# Patient Record
Sex: Female | Born: 1945
Health system: Southern US, Community
[De-identification: ages and names within clinical notes are randomized; demographics above are authoritative.]

## PROBLEM LIST (undated history)

## (undated) DIAGNOSIS — J45909 Unspecified asthma, uncomplicated: Secondary | ICD-10-CM

## (undated) DIAGNOSIS — H01003 Unspecified blepharitis right eye, unspecified eyelid: Secondary | ICD-10-CM

## (undated) DIAGNOSIS — K56600 Partial intestinal obstruction, unspecified as to cause: Secondary | ICD-10-CM

## (undated) DIAGNOSIS — K5792 Diverticulitis of intestine, part unspecified, without perforation or abscess without bleeding: Secondary | ICD-10-CM

## (undated) DIAGNOSIS — J449 Chronic obstructive pulmonary disease, unspecified: Secondary | ICD-10-CM

## (undated) DIAGNOSIS — Z8619 Personal history of other infectious and parasitic diseases: Secondary | ICD-10-CM

## (undated) DIAGNOSIS — R918 Other nonspecific abnormal finding of lung field: Secondary | ICD-10-CM

## (undated) DIAGNOSIS — G4733 Obstructive sleep apnea (adult) (pediatric): Secondary | ICD-10-CM

## (undated) DIAGNOSIS — I10 Essential (primary) hypertension: Secondary | ICD-10-CM

## (undated) DIAGNOSIS — H01006 Unspecified blepharitis left eye, unspecified eyelid: Secondary | ICD-10-CM

## (undated) DIAGNOSIS — H269 Unspecified cataract: Secondary | ICD-10-CM

## (undated) DIAGNOSIS — M1990 Unspecified osteoarthritis, unspecified site: Secondary | ICD-10-CM

## (undated) HISTORY — DX: Chronic obstructive pulmonary disease, unspecified: J44.9

## (undated) HISTORY — DX: Unspecified cataract: H26.9

## (undated) HISTORY — DX: Unspecified blepharitis right eye, unspecified eyelid: H01.003

## (undated) HISTORY — DX: Unspecified blepharitis right eye, unspecified eyelid: H01.006

## (undated) HISTORY — DX: Essential (primary) hypertension: I10

## (undated) HISTORY — DX: Personal history of other infectious and parasitic diseases: Z86.19

## (undated) HISTORY — PX: WISDOM TOOTH EXTRACTION: SHX21

## (undated) HISTORY — DX: Obstructive sleep apnea (adult) (pediatric): G47.33

## (undated) HISTORY — DX: Diverticulitis of intestine, part unspecified, without perforation or abscess without bleeding: K57.92

## (undated) HISTORY — PX: DILATION AND CURETTAGE OF UTERUS: SHX78

## (undated) HISTORY — PX: ANKLE SURGERY: SHX546

## (undated) HISTORY — DX: Partial intestinal obstruction, unspecified as to cause: K56.600

## (undated) HISTORY — DX: Other nonspecific abnormal finding of lung field: R91.8

## (undated) HISTORY — PX: OTHER SURGICAL HISTORY: SHX169

---

## 1998-04-01 ENCOUNTER — Emergency Department (HOSPITAL_COMMUNITY): Admission: EM | Admit: 1998-04-01 | Discharge: 1998-04-01 | Payer: Self-pay | Admitting: Emergency Medicine

## 1998-04-01 ENCOUNTER — Encounter: Payer: Self-pay | Admitting: Emergency Medicine

## 1998-07-22 ENCOUNTER — Encounter: Payer: Self-pay | Admitting: Obstetrics and Gynecology

## 1998-07-24 ENCOUNTER — Ambulatory Visit (HOSPITAL_COMMUNITY): Admission: RE | Admit: 1998-07-24 | Discharge: 1998-07-24 | Payer: Self-pay | Admitting: Obstetrics and Gynecology

## 1998-07-24 ENCOUNTER — Encounter (INDEPENDENT_AMBULATORY_CARE_PROVIDER_SITE_OTHER): Payer: Self-pay

## 1999-12-03 ENCOUNTER — Other Ambulatory Visit: Admission: RE | Admit: 1999-12-03 | Discharge: 1999-12-03 | Payer: Self-pay | Admitting: Obstetrics and Gynecology

## 2000-12-18 ENCOUNTER — Other Ambulatory Visit: Admission: RE | Admit: 2000-12-18 | Discharge: 2000-12-18 | Payer: Self-pay | Admitting: Obstetrics and Gynecology

## 2001-07-11 ENCOUNTER — Encounter: Payer: Self-pay | Admitting: Emergency Medicine

## 2001-07-11 ENCOUNTER — Emergency Department (HOSPITAL_COMMUNITY): Admission: EM | Admit: 2001-07-11 | Discharge: 2001-07-11 | Payer: Self-pay | Admitting: Emergency Medicine

## 2003-04-28 ENCOUNTER — Ambulatory Visit (HOSPITAL_COMMUNITY): Admission: RE | Admit: 2003-04-28 | Discharge: 2003-04-28 | Payer: Self-pay | Admitting: Gastroenterology

## 2004-08-11 ENCOUNTER — Encounter: Admission: RE | Admit: 2004-08-11 | Discharge: 2004-08-11 | Payer: Self-pay | Admitting: Rheumatology

## 2005-08-18 ENCOUNTER — Encounter: Admission: RE | Admit: 2005-08-18 | Discharge: 2005-08-18 | Payer: Self-pay | Admitting: Rheumatology

## 2006-11-25 ENCOUNTER — Inpatient Hospital Stay (HOSPITAL_COMMUNITY): Admission: EM | Admit: 2006-11-25 | Discharge: 2006-11-25 | Payer: Self-pay | Admitting: Emergency Medicine

## 2007-05-29 ENCOUNTER — Ambulatory Visit (HOSPITAL_COMMUNITY): Admission: RE | Admit: 2007-05-29 | Discharge: 2007-05-29 | Payer: Self-pay | Admitting: Gastroenterology

## 2009-06-28 ENCOUNTER — Emergency Department (HOSPITAL_COMMUNITY): Admission: EM | Admit: 2009-06-28 | Discharge: 2009-06-28 | Payer: Self-pay | Admitting: Emergency Medicine

## 2009-06-29 ENCOUNTER — Inpatient Hospital Stay (HOSPITAL_COMMUNITY): Admission: EM | Admit: 2009-06-29 | Discharge: 2009-07-01 | Payer: Self-pay | Admitting: Emergency Medicine

## 2009-06-29 ENCOUNTER — Encounter: Payer: Self-pay | Admitting: Internal Medicine

## 2009-07-15 ENCOUNTER — Encounter: Payer: Self-pay | Admitting: Internal Medicine

## 2009-08-13 ENCOUNTER — Ambulatory Visit: Payer: Self-pay | Admitting: Internal Medicine

## 2009-08-13 DIAGNOSIS — R918 Other nonspecific abnormal finding of lung field: Secondary | ICD-10-CM | POA: Insufficient documentation

## 2009-08-18 DIAGNOSIS — I10 Essential (primary) hypertension: Secondary | ICD-10-CM | POA: Insufficient documentation

## 2009-08-20 ENCOUNTER — Ambulatory Visit: Payer: Self-pay | Admitting: Internal Medicine

## 2009-08-24 ENCOUNTER — Telehealth: Payer: Self-pay | Admitting: Internal Medicine

## 2010-01-24 ENCOUNTER — Encounter: Payer: Self-pay | Admitting: Rheumatology

## 2010-02-02 NOTE — Letter (Signed)
Summary: [facility]   Imported By: Sherian Rein 09/08/2009 08:25:58  _____________________________________________________________________  External Attachment:    Type:   Image     Comment:   External Document

## 2010-02-02 NOTE — Progress Notes (Signed)
Summary: pft results  Phone Note Call from Patient Call back at [phone]   Caller: Patient Call For: Robertt Buda Reason for Call: Lab or Test Results Summary of Call: Wants pft results. Initial call taken by: Darletta Moll,  August 24, 2009 10:20 AM  Follow-up for Phone Call        Please advise on PFT results. PFT done on 08/20/2009.Michel Bickers [facility]  August 24, 2009 10:23 AM  Additional Follow-up for Phone Call Additional follow up Details #1::        Where are these? Do we know why not in computer. They should have been read and scanned by now.??? Additional Follow-up by: Waymon Budge MD,  August 25, 2009 8:31 PM    Additional Follow-up for Phone Call Additional follow up Details #2::    Katie have you seen this pt PFTs? Carron Curie CMA  August 26, 2009 1:24 PM   PFTs reprinted and placed on CDY's cart to reivew.Reynaldo Minium CMA  August 26, 2009 3:04 PM    Additional Follow-up for Phone Call Additional follow up Details #3:: Details for Additional Follow-up Action Taken: [facility] to report- mild restriction of volume and mild obstruction of flow. These are not bad scores. i will go over the withher at next ov. Additional Follow-up by: Waymon Budge MD,  August 30, 2009 10:46 PM  LMTCB.Reynaldo Minium CMA  August 31, 2009 11:50 AM    Pt aware of results and next appt. If any questions or concerns before next OV pt to call the office for sooner appt.Reynaldo Minium CMA  August 31, 2009 4:53 PM

## 2010-02-02 NOTE — Miscellaneous (Signed)
Summary: Orders Update pft charges  Clinical Lists Changes  Orders: Added new Service order of Carbon Monoxide diffusing w/capacity (94720) - Signed Added new Service order of Lung Volumes (94240) - Signed Added new Service order of Spirometry (Pre & Post) (94060) - Signed 

## 2010-02-02 NOTE — Assessment & Plan Note (Signed)
Summary: eval nodule/ mbw   Primary Zakaree Mcclenahan/Referring Kellin Fifer:  Danise Edge  CC:  Pulmonary Consult-Evaluate Nodule; Dr. Danise Edge.Marland Kitchen  History of Present Illness: August 13, 2009- 65 yoF referred courtesy of Dr Laural Benes because of lung nodules. She had a CT for partial small bowel obstruction with diverticulitis in June. Two 5mm noncalcified nodules were seen incidentally in the left lower lobe. She had several years of persistent bronchitis about 9 years ago. Last CXR was about that time. No hx pneumonia or TB exposure. and not aware of exposure to granulomatous infection endemic areas. Denies cough, nodes, blood, pain, fever, sweats or weight loss.   Preventive Screening-Counseling & Management  Alcohol-Tobacco     Smoking Status: never     Passive Smoke Exposure: yes     Passive Smoke Counseling: to avoid passive smoke exposure  Comments: Paents smoked as she grew up, and first husband smoked  Current Medications (verified): 1)  Hydrochlorothiazide 25 Mg Tabs (Hydrochlorothiazide) .... Take 1 By Mouth Once Daily 2)  Celebrex 200 Mg Caps (Celecoxib) .... Take 1 By Mouth Once Daily As Needed  Allergies (verified): No Known Drug Allergies  Past History:  Past Medical History: Hypertension Diverticulitis Partial small bowel exposure  Past Surgical History: right Rotator Cuff  Family History: Mother- died MVA Father died young, unk cause  Social History: Widowed, remarried ETOH-4-5 times week Works part-time Patient never smoked.  Smoking Status:  never Passive Smoke Exposure:  yes  Review of Systems      See HPI  The patient denies shortness of breath with activity, shortness of breath at rest, productive cough, non-productive cough, coughing up blood, chest pain, irregular heartbeats, acid heartburn, indigestion, loss of appetite, weight change, abdominal pain, difficulty swallowing, sore throat, tooth/dental problems, headaches, nasal  congestion/difficulty breathing through nose, sneezing, itching, ear ache, rash, change in color of mucus, and fever.         Snores/ wakes frequently at night  Vital Signs:  Patient profile:   65 year old female Weight:      160 pounds O2 Sat:      96 % on Room air Pulse rate:   78 / minute BP sitting:   120 / 82  (left arm) Cuff size:   regular  Vitals Entered By: Reynaldo Minium CMA (August 13, 2009 3:50 PM)  O2 Flow:  Room air CC: Pulmonary Consult-Evaluate Nodule; Dr. Danise Edge.   Physical Exam  Additional Exam:  General: A/Ox3; pleasant and cooperative, NAD, wdwn SKIN: no rash, lesions NODES: no lymphadenopathy HEENT: [place]/AT, EOM- WNL, Conjuctivae- clear, PERRLA, TM-WNL, Nose- clear, Throat- clear and wnl, Mallampati  IV NECK: Supple w/ fair ROM, JVD- none, normal carotid impulses w/o bruits Thyroid- normal to palpation CHEST: Clear to P&A HEART: RRR, no m/g/r heard ABDOMEN: Soft and nl; nml bowel sounds; no organomegaly or masses noted QMV:HQIO, nl pulses, no edema, cyanosis or clubbing  NEURO: Grossly intact to observation      Impression & Recommendations:  Problem # 1:  LUNG NODULE (ICD-518.89)  Incidental lung nodules of size to be granulomas, but not calcified and we are not sure how long they have been present. She is cautious about further radiation exposure. We discussed options, explaining they are too small to biopsy. We will get a PFT Hx passive smoking was discussed. She has no ongoing smoke exposure, but will be at some uncertain risk of tobacco induced disease.  Medications Added to Medication List This Visit: 1)  Hydrochlorothiazide 25 Mg Tabs (  Hydrochlorothiazide) .... Take 1 by mouth once daily 2)  Celebrex 200 Mg Caps (Celecoxib) .... Take 1 by mouth once daily as needed  Other Orders: Consultation Level IV (33295) Full Pulmonary Function Test (PFT)  Patient Instructions: 1)  Please schedule a follow-up appointment in 6 months. Call  sooner if needed. 2)  See [facility] to schedule PFT 3)  cc Dr Laural Benes

## 2010-02-12 ENCOUNTER — Ambulatory Visit: Payer: Self-pay | Admitting: Internal Medicine

## 2010-03-02 ENCOUNTER — Encounter: Payer: Self-pay | Admitting: Internal Medicine

## 2010-03-02 ENCOUNTER — Ambulatory Visit (INDEPENDENT_AMBULATORY_CARE_PROVIDER_SITE_OTHER): Payer: 59 | Admitting: Internal Medicine

## 2010-03-02 ENCOUNTER — Other Ambulatory Visit: Payer: Self-pay | Admitting: Internal Medicine

## 2010-03-02 ENCOUNTER — Ambulatory Visit (INDEPENDENT_AMBULATORY_CARE_PROVIDER_SITE_OTHER)
Admission: RE | Admit: 2010-03-02 | Discharge: 2010-03-02 | Disposition: A | Discharge status: Home / Self Care | Payer: 59 | Source: Ambulatory Visit | Attending: Internal Medicine | Admitting: Internal Medicine

## 2010-03-02 DIAGNOSIS — J302 Other seasonal allergic rhinitis: Secondary | ICD-10-CM | POA: Insufficient documentation

## 2010-03-02 DIAGNOSIS — J309 Allergic rhinitis, unspecified: Secondary | ICD-10-CM

## 2010-03-02 DIAGNOSIS — J984 Other disorders of lung: Secondary | ICD-10-CM

## 2010-03-04 ENCOUNTER — Ambulatory Visit: Payer: Self-pay | Admitting: Internal Medicine

## 2010-03-11 NOTE — Assessment & Plan Note (Signed)
Summary: 6 omth rov/kp   Primary Provider/Referring Provider:  Danise Edge  CC:  6 month follow up visit-Lung nodule; hoarseness at times; review PFT results..  History of Present Illness: History of Present Illness: 2009-09-05- 63 yoF referred courtesy of Dr Laural Benes because of lung nodules. She had a CT for partial small bowel obstruction with diverticulitis in June. Two 5mm noncalcified nodules were seen incidentally in the left lower lobe. She had several years of persistent bronchitis about 9 years ago. Last CXR was about that time. No hx pneumonia or TB exposure. and not aware of exposure to granulomatous infection endemic areas. Denies cough, nodes, blood, pain, fever, sweats or weight loss.  March 02, 2010- Lung nodules, rhinitis Nurse-CC: 6 month follow up visit-Lung nodule; hoarseness at times; review PFT results. PFT- 08/20/09- minimal obstruction and restriction.  She had CXR while in hosp with bowel obstruction July, 2011.  Some sneeze and nasal congestion. Very occasional cough , scant mucus. Denies night sweats or fever. Denies reflux.  Asks about her frequent waking, often with sense of snore, leaving her a little cranky in daytime. Husband bothered by her loud snore.    Preventive Screening-Counseling & Management  Alcohol-Tobacco     Smoking Status: never     Passive Smoke Exposure: yes     Passive Smoke Counseling: to avoid passive smoke exposure  Current Medications (verified): 1)  Hydrochlorothiazide 25 Mg Tabs (Hydrochlorothiazide) .... Take 1 By Mouth Once Daily 2)  Celebrex 200 Mg Caps (Celecoxib) .... Take 1 By Mouth Every Other Day  Allergies (verified): No Known Drug Allergies  Past History:  Past Surgical History: Last updated: 09/05/09 right Rotator Cuff  Family History: Last updated: 09/05/09 Mother- died MVA Father died Katara Griner, unk cause  Social History: Last updated: 2009-09-05 Widowed, remarried ETOH-4-5 times week Works  part-time Patient never smoked.   Risk Factors: Smoking Status: never (03/02/2010) Passive Smoke Exposure: yes (03/02/2010)  Past Medical History: Hypertension Diverticulitis Partial small bowel exposure Lung nodules- PFT 08/20/09- FEV1 2.04/ 92%; FEV1/FVC 0.79  Review of Systems      See HPI       The patient complains of weight change, nasal congestion/difficulty breathing through nose, and sneezing.  The patient denies shortness of breath with activity, shortness of breath at rest, productive cough, non-productive cough, coughing up blood, chest pain, irregular heartbeats, acid heartburn, indigestion, loss of appetite, abdominal pain, difficulty swallowing, sore throat, tooth/dental problems, headaches, ear ache, rash, and change in color of mucus.    Vital Signs:  Patient profile:   65 year old female Height:      65 inches Weight:      164.13 pounds BMI:     27.41 O2 Sat:      96 % on Room air Pulse rate:   67 / minute BP sitting:   132 / 90  (left arm) Cuff size:   regular  Vitals Entered By: Reynaldo Minium CMA (March 02, 2010 9:57 AM)  O2 Flow:  Room air CC: 6 month follow up visit-Lung nodule; hoarseness at times; review PFT results.   Physical Exam  Additional Exam:  General: A/Ox3; pleasant and cooperative, NAD, wdwn SKIN: no rash, lesions NODES: no lymphadenopathy HEENT: [place]/AT, EOM- WNL, Conjuctivae- clear, PERRLA, TM-WNL, Nose- audibly stuffy, narrow with mucus bridging, Throat- clear and wnl, Mallampat III- IV NECK: Supple w/ fair ROM, JVD- none, normal carotid impulses w/o bruits Thyroid- normal to palpation CHEST: Clear to P&A HEART: RRR, no m/g/r heard  ABDOMEN: Soft and nl;  ZOX:WRUE, nl pulses, no edema, cyanosis or clubbing  NEURO: Grossly intact to observation      CXR  Procedure date:  03/03/2010  Findings:      DG CHEST 2 VIEW - [id number] Clinical Data: Allergic rhinitis.   CHEST - 2 VIEW   Comparison: 11/24/2006   Findings: Lungs  are grossly clear. No pleural effusion or pneumothorax.   Cardiomediastinal silhouette is within normal limits.   Visualized osseous structures are within normal limits.   IMPRESSION: No evidence of acute cardiopulmonary disease.   Original Report Authenticated By: Charline Bills, M.D.   Impression & Recommendations:  Problem # 1:  LUNG NODULE (ICD-518.89)  Small nodules seen on CT. We didn't do further studies at the time of first visit because she had had xrays while in hospital. I will get CXR now.  See CXR report embedded with this note- no obvious nodules or active process.   Problem # 2:  ALLERGIC RHINITIS (ICD-477.9)  Perennial rhintis. I will give nasal steroid for trial  Question of OSA.- She complains of snore and interrupted sleep. It treating her nose doesn't change this, she may benefit froma sleep study.   Her updated medication list for this problem includes:    Nasonex 50 Mcg/act Susp (Mometasone furoate) .Marland Kitchen... 2 sprays each nostril every night at bedtime.  Problem # 3:  OBSTRUCTIVE SLEEP APNEA (ICD-327.23) question from her symptoms. See if fixing her nose helps this.   Medications Added to Medication List This Visit: 1)  Celebrex 200 Mg Caps (Celecoxib) .... Take 1 by mouth every other day 2)  Nasonex 50 Mcg/act Susp (Mometasone furoate) .... 2 sprays each nostril every night at bedtime.  Other Orders: Est. Patient Level IV (99214) T-2 View CXR (71020TC)  Patient Instructions: 1)  Please schedule a follow-up appointment in 1 month. 2)  Sample/ script Nasonex  3)     1-2 sprays each nostril, every night at bedtime.  4)  A chest x-ray has been recommended.  Your imaging study may require preauthorization. >>>>See report embedded in this note. Prescriptions: NASONEX 50 MCG/ACT SUSP (MOMETASONE FUROATE) 2 sprays each nostril every night at bedtime.  #1 x prn   Entered and Authorized by:   Waymon Budge MD   Signed by:   Waymon Budge MD on  03/02/2010   Method used:   Print then Give to Patient   RxID:   [id number]    CXR  Procedure date:  03/03/2010  Findings:      DG CHEST 2 VIEW - [id number] Clinical Data: Allergic rhinitis.   CHEST - 2 VIEW   Comparison: 11/24/2006   Findings: Lungs are grossly clear. No pleural effusion or pneumothorax.   Cardiomediastinal silhouette is within normal limits.   Visualized osseous structures are within normal limits.   IMPRESSION: No evidence of acute cardiopulmonary disease.   Original Report Authenticated By: Charline Bills, M.D.

## 2010-03-21 LAB — CBC
HCT: 41.4 % (ref 36.0–46.0)
Hemoglobin: 12.1 g/dL (ref 12.0–15.0)
Hemoglobin: 14.3 g/dL (ref 12.0–15.0)
MCH: 31.5 pg (ref 26.0–34.0)
MCH: 32.1 pg (ref 26.0–34.0)
MCV: 91.4 fL (ref 78.0–100.0)
Platelets: 242 10*3/uL (ref 150–400)
RBC: 3.76 MIL/uL — ABNORMAL LOW (ref 3.87–5.11)
RDW: 12.6 % (ref 11.5–15.5)
RDW: 12.8 % (ref 11.5–15.5)
WBC: 7.8 10*3/uL (ref 4.0–10.5)

## 2010-03-21 LAB — BASIC METABOLIC PANEL
BUN: 7 mg/dL (ref 6–23)
CO2: 27 mEq/L (ref 19–32)
Calcium: 8.6 mg/dL (ref 8.4–10.5)
Chloride: 105 mEq/L (ref 96–112)
Creatinine, Ser: 0.56 mg/dL (ref 0.4–1.2)
Glucose, Bld: 113 mg/dL — ABNORMAL HIGH (ref 70–99)
Potassium: 3.3 mEq/L — ABNORMAL LOW (ref 3.5–5.1)

## 2010-03-21 LAB — URINALYSIS, ROUTINE W REFLEX MICROSCOPIC
Glucose, UA: NEGATIVE mg/dL
Ketones, ur: NEGATIVE mg/dL
pH: 6 (ref 5.0–8.0)

## 2010-03-21 LAB — POCT I-STAT, CHEM 8
BUN: 18 mg/dL (ref 6–23)
HCT: 44 % (ref 36.0–46.0)
Sodium: 141 mEq/L (ref 135–145)

## 2010-03-21 LAB — DIFFERENTIAL
Eosinophils Absolute: 0.1 10*3/uL (ref 0.0–0.7)
Lymphocytes Relative: 23 % (ref 12–46)
Neutrophils Relative %: 72 % (ref 43–77)

## 2010-03-31 ENCOUNTER — Ambulatory Visit: Payer: 59 | Admitting: Internal Medicine

## 2010-05-12 ENCOUNTER — Ambulatory Visit: Payer: 59 | Admitting: Internal Medicine

## 2010-05-18 NOTE — H&P (Signed)
NAME:  Lynn Estes, Lynn Estes NO.:  [id number]   MEDICAL RECORD NO.:  [id number]          PATIENT TYPE:  INP   LOCATION:  1824                         FACILITY:  [facility]   PHYSICIAN:  Vivianne Spence, MD       DATE OF BIRTH:  Mar 10, 1945   DATE OF ADMISSION:  11/24/2006  DATE OF DISCHARGE:                              HISTORY & PHYSICAL   HISTORY OF THE PRESENT ILLNESS:  The patient is a 65 year old female  with a past medical history of hypertension who comes in tonight after a  syncopal episode at home.  This episode was witnessed by her husband who  is a Company secretary and who states that the episode lasted about 10 minutes.  The patient woke up fine and she was doing well today, she had dinner,  which was the same as last night's dinner, and she was about to cut into  some dessert when she started to feel lightheaded and started to see  spots.  She went into the bedroom to wash her face.  After washing her  face she felt like she needed to sit down after which she did and she  does not remember the rest.  The rest of the story is per her husband,  who claims that she was staring to the left and during this her right  arm was sort of posturing across her belly.  She also had some  incontinence of bladder during this episode.  There was not noted  shaking or rigorous movements during this episode.  After she awoke she  felt okay, but was brought into the emergency department despite a  little bit of resistance.  She did have a similar episode of this  approximately one year ago after rotator cuff surgery, but did not seek  any medical attention for that.  She did have bowel incontinence during  the episode one year ago.   Currently she is feeling well.  She has no complaints.   REVIEW OF SYSTEMS:  A full review of systems is negative.   PAST MEDICAL HISTORY:  The past medical history is as noted above.   PAST SURGICAL HISTORY:  The past surgical history is as above.   FAMILY  HISTORY:  The family history is noncontributory.   SOCIAL HISTORY:  No smoking.  Three glasses per week.  She still works  as an Editor, commissioning at [employer].   MEDICATIONS:  Hydrochlorothiazide 25 mg by mouth daily.   ALLERGIES:  No known drug allergies.   PHYSICAL EXAMINATION:  VITAL SIGNS:  Blood pressure lying down is 122/72  with a pulse of 80, sitting it is 173/73 with a pulse of 83 and standing  is 112/77 with a pulse of 83.  Temp 96.8, respirations 16 and O2 sat 98%  on room air.  GENERAL APPEARANCE:  In general she is well-developed, well-nourished,  and in no respiratory distress.  She is very pleasant.  HEENT:  The head, eyes, ears, nose and throat exam is benign.  HEART:  The heart has a regular rate and rhythm with no murmurs, rubs or  gallops.  LUNGS:  The lungs are clear to auscultation bilaterally.  No wheezes,  rales or rhonchi.  ABDOMEN:  The abdomen is soft, nontender and nondistended with positive  bowel sounds.  EXTREMITIES:  The extremities have no cyanosis, clubbing or edema;  however, she does have some changes in her fingers consistent with  osteoarthritis with some contractures in the digits of her hands as  well.  NEUROLOGIC EXAMINATION:  Neurologically she has no focal deficits.  Cranial nerves are intact.  Muscle strength is 5/5 in her upper and  lower extremities.   LABORATORY DATA:  CBC; white count 6.9 and hemoglobin 13.1.  Potassium  3.2 and glucose 130.  Troponin less than 0.05.  CK/MB 1.3.  Urinalysis;  some large leukocyte esterase and 7-10 white blood cells.  CT scan of  the head showed no acute intracranial abnormalities.  X-ray showed no  acute cardiopulmonary disease.   ASSESSMENT:  1. Syncope, etiology unclear, possibly due to a seizure versus      vasovagal, which are my top two differentials.  2. Urinary tract infection.  3. Hypokalemia.  4. Hyperglycemia.   PLAN:  1. We will admit her to telemetry to follow for an abnormal  rhythm.  I      do not suspect that cardiac ischemia is a cause.  2. We will treat her UTI with Bactrim Double Strength 1 tablet by      mouth twice daily for three days.  3. I will order an EEG because of the two episodes now with loss of      bowel and bladder continence; although, I do think that vasovagal      is the most likely etiology.  4. I will correct her hypokalemia with potassium orally.  5. The patient's hyperglycemia is reactive and therefore I will not      treat it unless her morning chemistries shows an elevated glycemic      level.  6. I will provide DVT prophylaxis with Lovenox.      Vivianne Spence, MD  Electronically Signed     JB/MEDQ  D:  11/25/2006  T:  11/25/2006  Job:  [id number]

## 2010-05-18 NOTE — Consult Note (Signed)
NAME:  Lynn Estes, Lynn Estes          ACCOUNT NO.:  [id number]   MEDICAL RECORD NO.:  [id number]          PATIENT TYPE:  INP   LOCATION:  3731                         FACILITY:  [facility]   PHYSICIAN:  Michael L. Reynolds, M.D.DATE OF BIRTH:  1945-02-28   DATE OF CONSULTATION:  11/25/2006  DATE OF DISCHARGE:  11/25/2006                                 CONSULTATION   REQUESTING PHYSICIAN:  Dr. Georgann Housekeeper.   REASON FOR EVALUATION:  Possible seizure.   HISTORY OF PRESENT ILLNESS:  This is the initial inpatient consultation  evaluation of this 65 year old woman with a little past medical history.  The patient is admitted after a syncopal episode at home.  The episode  was witnessed by her husband who is an EMT.  The patient says that she  had just eaten dinner, and she felt slightly nauseated.  She stood to  walk into her bedroom, and as she was going into her bedroom,  she felt  that she was a little bit unsteady on her feet.  She then sat down the  chair, and began to feel somewhat lightheaded.  She also says that she  was seeing stars in her vision.  This is the last thing that she  recalls before losing consciousness.  Her husband says that she became  unresponsive that point, seemed to be staring off with her eyes open.  Her right arm drew up a little bit, although she did not have any  focal twitching or other involuntary movements.  She then went  completely flaccid.  Her husband says that she was clammy and very pale  at this point.  He placed her flat on the ground and elevated her legs.  He states that briefly she was apneic, but she quickly resumed  breathing.  He estimates that she was poorly responsive for 3-5 minutes,  and then not fully responsive for a few minutes more.  The patient  herself states that she recalls waking up with her husband in her face,  yelling at her, and from the moment she awoke, she says that she was  clear and has good memory of everything that  happened.  She has felt  fairly well, since awakening.  She was admitted for further  considerations regarding this episode.  She is on telemetry monitoring  which has been uneventful.  The patient stated that she has had some  syncopal episodes in the past.  She specifically recalls one episode  about 3 years ago, when she woke up in the middle of the night with  severe abdominal pain.  She was trying to use the bathroom when she fell  and passed out, and she experienced bowel and bladder incontinence at  that time, along with nausea and vomiting.  She also reports having some  syncopal episodes and passing out, after she had her rotator cuff  surgery.  She was taking a fair amount of pain medication at that time.  The patient did suffer urinary incontinence during the episode  yesterday, but there was no tongue trauma.   PAST MEDICAL HISTORY:  She does have a history of  mild hypertension for  which she is on hydrochlorothiazide.  She has had rotator cuff surgery  in the past.  Otherwise, she denies chronic medical problems.   FAMILY/SOCIAL/REVIEW OF SYSTEMS:  Per admission HPI, by Dr. Birder Robson of  November 25, 2006, which is reviewed.  Also, per admission nursing  record which is reviewed.   MEDICATIONS AT HOME:  At home, she takes only hydrochlorothiazide.  She  continues on that in the hospital, along with some potassium and Bactrim  for a urinary tract infection.   PHYSICAL EXAMINATION:  VITAL SIGNS:  Temperature 94, blood pressure  106/69, pulse 78, respirations 18, O2 sat 97% on room air.  Orthostatic  vital signs performed this afternoon are unremarkable.  GENERAL EXAMINATION:  This is a healthy-appearing woman seen in no  distress.  HEAD:  Cranium is normocephalic and atraumatic .  Oropharynx benign.  NECK:  Supple without carotid or supraclavicular bruits.  HEART:  Regular rate and rhythm without murmurs.  NEUROLOGICAL/MENTAL STATUS:  She is awake and alert.  She is fully   oriented to time, place and person.  Recent and remote memory are  intact.  Attention span, concentration, fund of knowledge are all  appropriate.  Speech is fluent and not dysarthric.  She has noticed no  defects to confrontational naming and can repeat a phrase.  Mood is  euthymic and affect appropriate.  Cranial nerves:  Pupils are equal and  reactive.  Extraocular movements full without nystagmus.  Visual fields  full to confrontation.  Hearing is intact to conversational speech.  Facial sensation is intact to pinprick.  Face, tongue and palate move  normally and symmetrically.  Motor:  Normal bulk and tone.  Normal  strength of extremity muscles.  Sensation intact to light touch and  pinprick in all extremities.  Coordination:  Rapid performed were  performed accurately.  Finger-nose and heel-shin were performed  accurately.  Gait:  She arises easily from a chair, and her stance is  normal.  She is able to heel-toe and tandem walk without difficulty.  She is able to toe walk and tandem stand without difficulty.  Reflexes  2+ and symmetric.  Toes are downgoing bilaterally.   LABORATORY DATA:  TSH is normal.  CBC shows slightly elevated glucose of  130, low potassium 3.2, otherwise normal., once set of cardiac enzymes  negative.  CBC is normal.  CT of the head performed last night is  personally reviewed.  The study is normal.   IMPRESSION:  Transient alteration of consciousness.  The history to me  is more suggestive of a vasovagal event than a seizure.  The only thing  that concerns me a bit is the duration of poor response following the  event, which is a little concerning.  Note that bowel-bladder  incontinence are highly nonspecific and can occur with both some seizure  and syncope.   RECOMMENDATIONS:  We will check an outpatient EEG as part of workup.  She also needs an echo, consider Holter monitor, consider tilt table  testing.  I do not think that she needs to restrict her  activities at  this time, although certainly if she starts to feeling bad, she needs to  stop doing what she is doing and assume a supine position.  I will see  her back as needed.      Michael L. Thad Ranger, M.D.  Electronically Signed     MLR/MEDQ  D:  11/25/2006  T:  11/26/2006  Job:  [id number]   cc:   Georgann Housekeeper, MD  Danise Edge, M.D.

## 2010-05-21 NOTE — Op Note (Signed)
NAME:  Lynn Estes, Lynn Estes                   ACCOUNT NO.:  [id number]   MEDICAL RECORD NO.:  [id number]                   PATIENT TYPE:  AMB   LOCATION:  ENDO                                 FACILITY:  [facility]   PHYSICIAN:  Danise Edge, M.D.                DATE OF BIRTH:  1945-02-22   DATE OF PROCEDURE:  04/28/2003  DATE OF DISCHARGE:                                 OPERATIVE REPORT   PROCEDURE:  Screening colonoscopy.   INDICATIONS:  [name] is a 65 year old female born September 05, 1945.  In 1994 [name] underwent a diagonal colonoscopy to evaluate iron  deficiency anemia and painless hematochezia.  Proctocolonoscopy to the cecum  was normal.  She is due to undergo a screening colonoscopy with polypectomy  to prevent colon cancer.   ENDOSCOPIST:  Charolett Bumpers, M.D.   PREMEDICATION:  1. Demerol 70 mcg.  2. Versed 7 mg.   DESCRIPTION OF PROCEDURE:  After obtaining informed consent, [name] was  placed in the left lateral decubitus position.  I administered intravenous  Demerol and intravenous Versed to achieve conscious sedation for the  procedure.  The patient's blood pressure, oxygen saturation and cardiac  rhythm were monitored throughout the procedure and documented in the medical  record.   Anal inspection and digital rectal examination were normal.  The Olympus  pediatric video colonoscope was introduced into the rectum and easily  advanced to the cecum.  Colonic preparation for the examination today was  excellent.   FINDINGS:  RECTUM:  Normal.  SIGMOID AND DESCENDING COLON:  Left colonic diverticulosis.  SPLENIC FLEXURE:  Normal.  TRANSVERSE COLON:  Normal.  HEPATIC FLEXURE:  Normal.  ASCENDING COLON:  Normal.  CECUM AND ILEOCECAL VALVE:  Normal.   ASSESSMENT:  Normal screening proctocolonoscopy to the cecum.  No endoscopic  evidence for the presence of colorectal neoplasia.                                               Danise Edge,  M.D.    MJ/MEDQ  D:  04/28/2003  T:  04/28/2003  Job:  [id number]

## 2010-06-14 ENCOUNTER — Encounter: Payer: Self-pay | Admitting: Internal Medicine

## 2010-06-17 ENCOUNTER — Ambulatory Visit: Payer: 59 | Admitting: Internal Medicine

## 2010-07-09 ENCOUNTER — Ambulatory Visit: Payer: 59 | Admitting: Internal Medicine

## 2010-08-05 ENCOUNTER — Ambulatory Visit: Payer: 59 | Admitting: Internal Medicine

## 2010-09-29 LAB — CREATININE, SERUM
Creatinine, Ser: 0.7
GFR calc non Af Amer: >60

## 2010-10-12 LAB — COMPREHENSIVE METABOLIC PANEL
Chloride: 101
Creatinine, Ser: 0.77
GFR calc Af Amer: >60
GFR calc non Af Amer: >60
Glucose, Bld: 130 — ABNORMAL HIGH
Potassium: 3.2 — ABNORMAL LOW
Sodium: 137
Total Bilirubin: 0.8

## 2010-10-12 LAB — URINALYSIS, ROUTINE W REFLEX MICROSCOPIC: Urobilinogen, UA: 0.2

## 2010-10-12 LAB — URINE MICROSCOPIC-ADD ON

## 2010-10-12 LAB — POCT CARDIAC MARKERS
CKMB, poc: 1.3
Myoglobin, poc: 61.8
Operator id: 270651

## 2010-10-12 LAB — DIFFERENTIAL
Eosinophils Absolute: 0.1 — ABNORMAL LOW
Lymphs Abs: 1.7
Monocytes Relative: 6
Neutro Abs: 4.7

## 2010-10-12 LAB — CBC
Hemoglobin: 13.1
MCV: 88.6
Platelets: 294
RBC: 4.23
WBC: 6.9

## 2010-10-12 LAB — TSH: TSH: 3.024

## 2011-03-15 ENCOUNTER — Other Ambulatory Visit: Payer: Self-pay | Admitting: Internal Medicine

## 2011-04-06 ENCOUNTER — Ambulatory Visit (INDEPENDENT_AMBULATORY_CARE_PROVIDER_SITE_OTHER): Payer: 59 | Admitting: Internal Medicine

## 2011-04-06 ENCOUNTER — Ambulatory Visit (INDEPENDENT_AMBULATORY_CARE_PROVIDER_SITE_OTHER)
Admission: RE | Admit: 2011-04-06 | Discharge: 2011-04-06 | Disposition: A | Discharge status: Home / Self Care | Payer: 59 | Source: Ambulatory Visit | Attending: Internal Medicine | Admitting: Internal Medicine

## 2011-04-06 ENCOUNTER — Encounter: Payer: Self-pay | Admitting: Internal Medicine

## 2011-04-06 VITALS — BP 122/84 | HR 77 | Ht 64.0 in | Wt 171.6 lb

## 2011-04-06 DIAGNOSIS — R6889 Other general symptoms and signs: Secondary | ICD-10-CM

## 2011-04-06 DIAGNOSIS — G4733 Obstructive sleep apnea (adult) (pediatric): Secondary | ICD-10-CM

## 2011-04-06 DIAGNOSIS — J984 Other disorders of lung: Secondary | ICD-10-CM

## 2011-04-06 DIAGNOSIS — R0989 Other specified symptoms and signs involving the circulatory and respiratory systems: Secondary | ICD-10-CM

## 2011-04-06 NOTE — Patient Instructions (Signed)
Order- CXR   Dx hx of lung nodules  [facility]G   Dx OSA

## 2011-04-06 NOTE — Progress Notes (Signed)
04/06/11- 65 yoF never smoker followed for history of lung nodules, rhinitis.  PCP Dr Laural Benes LOV-03/02/10 2 left lower lobe nodules, 5 mm each, noncalcified, were found on CT done for partial bowel obstruction by diverticulitis in 2011. No history of TB exposure. History of allergic rhinitis. She comes now complaining of occasional throat tightness especially when lying supine but sometimes when eating or drinking. Sometimes worse. Snores loudly. Wakes every hour at night. Using Nasonex.  ROS-see HPI Constitutional:   No-   weight loss, night sweats, fevers, chills, fatigue, lassitude. HEENT:   No-  headaches,  +difficulty swallowing, tooth/dental problems, sore throat,       No-  sneezing, itching, ear ache, nasal congestion, post nasal drip,  CV:  No-   chest pain, orthopnea, PND, swelling in lower extremities, anasarca, dizziness, palpitations Resp: No-   shortness of breath with exertion or at rest.              No-   productive cough,  No non-productive cough,  No- coughing up of blood.              No-   change in color of mucus.  No- wheezing.   Skin: No-   rash or lesions. GI:  No-   heartburn, indigestion, abdominal pain, nausea, vomiting,  GU: No-   dysuria, . MS:  No-   joint pain or swelling.  Neuro-     nothing unusual Psych:  No- change in mood or affect. No depression or anxiety.  No memory loss.  OBJ- Physical Exam General- Alert, Oriented, Affect-appropriate, Distress- none acute Skin- rash-none, lesions- none, excoriation- none Lymphadenopathy- none Head- atraumatic            Eyes- Gross vision intact, PERRLA, conjunctivae and secretions clear            Ears- Hearing, canals-normal            Nose- Clear- not congested, no-Septal dev, mucus, polyps, erosion, perforation             Throat- Mallampati II , mucosa clear , drainage- none, tonsils- atrophic Neck- flexible , trachea midline, no stridor , thyroid nl, carotid no bruit Chest - symmetrical excursion ,  unlabored           Heart/CV- RRR , no murmur , no gallop  , no rub, nl s1 s2                           - JVD- none , edema- none, stasis changes- none, varices- none           Lung- clear to P&A, wheeze- none, cough- none , dullness-none, rub- none           Chest wall-  Abd- tender-no, distended-no, bowel sounds-present, HSM- no Br/ Gen/ Rectal- Not done, not indicated Extrem- cyanosis- none, clubbing, none, atrophy- none, strength- nl Neuro- grossly intact to observation

## 2011-04-08 ENCOUNTER — Other Ambulatory Visit: Payer: Self-pay | Admitting: Internal Medicine

## 2011-04-08 ENCOUNTER — Telehealth: Payer: Self-pay | Admitting: Internal Medicine

## 2011-04-08 ENCOUNTER — Other Ambulatory Visit (INDEPENDENT_AMBULATORY_CARE_PROVIDER_SITE_OTHER): Payer: 59

## 2011-04-08 ENCOUNTER — Ambulatory Visit (INDEPENDENT_AMBULATORY_CARE_PROVIDER_SITE_OTHER)
Admission: RE | Admit: 2011-04-08 | Discharge: 2011-04-08 | Disposition: A | Discharge status: Home / Self Care | Payer: 59 | Source: Ambulatory Visit | Attending: Internal Medicine | Admitting: Internal Medicine

## 2011-04-08 DIAGNOSIS — R918 Other nonspecific abnormal finding of lung field: Secondary | ICD-10-CM

## 2011-04-08 LAB — BASIC METABOLIC PANEL
BUN: 21 mg/dL (ref 6–23)
CO2: 26 mEq/L (ref 19–32)
Calcium: 9.4 mg/dL (ref 8.4–10.5)
GFR: 90.87 mL/min (ref >60.00)
Glucose, Bld: 105 mg/dL — ABNORMAL HIGH (ref 70–99)
Sodium: 139 mEq/L (ref 135–145)

## 2011-04-08 MED ORDER — IOHEXOL 300 MG/ML  SOLN
80.0000 mL | Freq: Once | INTRAMUSCULAR | Status: AC | PRN
  Administered 2011-04-08: 80 mL via INTRAVENOUS

## 2011-04-08 NOTE — Telephone Encounter (Signed)
Called and spoke with pt and she is aware that CY is out of the office today and i will forward this to CY to get the results of her labs and ct scan.  Pt is ok with a call back on Monday.  CY please advise of these results.  thanks

## 2011-04-08 NOTE — Progress Notes (Signed)
Quick Note:  Pt aware of results and aware CT chest with contrast ordered;pt to come by office today 04-08-11 to have BMET drawn STAT. ______

## 2011-04-11 NOTE — Progress Notes (Signed)
Quick Note:  Pt aware of results. ______ 

## 2011-04-11 NOTE — Telephone Encounter (Signed)
Ok to notify results. Have her keep appointment.

## 2011-04-11 NOTE — Telephone Encounter (Signed)
Pt aware of results 

## 2011-04-15 DIAGNOSIS — R0989 Other specified symptoms and signs involving the circulatory and respiratory systems: Secondary | ICD-10-CM | POA: Insufficient documentation

## 2011-04-15 DIAGNOSIS — R6889 Other general symptoms and signs: Secondary | ICD-10-CM | POA: Insufficient documentation

## 2011-04-15 NOTE — Assessment & Plan Note (Signed)
Hard to get a clear pattern but suggestion that snoring at night might be causing throat discomfort the next day. We can look for sleep apnea.

## 2011-04-15 NOTE — Assessment & Plan Note (Signed)
If these nodules have not grown large enough to be seen on chest x-ray then we may be able to save her the cost/ radiation exposure of frequent CT scans.

## 2011-04-27 ENCOUNTER — Ambulatory Visit (HOSPITAL_BASED_OUTPATIENT_CLINIC_OR_DEPARTMENT_OTHER): Payer: 59 | Attending: Internal Medicine

## 2011-04-27 VITALS — Ht 64.0 in | Wt 169.0 lb

## 2011-04-27 DIAGNOSIS — G4730 Sleep apnea, unspecified: Secondary | ICD-10-CM | POA: Insufficient documentation

## 2011-04-27 DIAGNOSIS — G4733 Obstructive sleep apnea (adult) (pediatric): Secondary | ICD-10-CM

## 2011-04-27 DIAGNOSIS — G4710 Hypersomnia, unspecified: Secondary | ICD-10-CM | POA: Insufficient documentation

## 2011-04-30 DIAGNOSIS — G4710 Hypersomnia, unspecified: Secondary | ICD-10-CM

## 2011-04-30 DIAGNOSIS — G4730 Sleep apnea, unspecified: Secondary | ICD-10-CM

## 2011-04-30 NOTE — Procedures (Signed)
NAME:  Lynn Estes, Lynn Estes          ACCOUNT NO.:  [id number]  MEDICAL RECORD NO.:  [id number]          PATIENT TYPE:  OUT  LOCATION:  SLEEP CENTER                 FACILITY:  [facility]  PHYSICIAN:  Johnathan Heskett D. Maple Hudson, MD, FCCP, FACPDATE OF BIRTH:  [birth year]  DATE OF STUDY:  04/27/2011                           NOCTURNAL POLYSOMNOGRAM  REFERRING PHYSICIAN:  Jermeka Schlotterbeck D. Maple Hudson, MD, FCCP, FACP  REFERRING PHYSICIAN:  Donnelle Rubey D. Eduardo Wurth, MD, FCCP, FACP  INDICATION FOR STUDY:  Hypersomnia with sleep apnea.  EPWORTH SLEEPINESS SCORE:  4/24.  BMI 29, weight 169 pounds, height 64 inches, neck 15 inches.  MEDICATIONS:  Home medications are charted and reviewed.  SLEEP ARCHITECTURE:  Total sleep time 296.5 minutes with sleep efficiency 81.7%.  Stage I was 8.4%, stage II 80.3%, stage III absent, and REM 11.3% of total sleep time.  Sleep latency 14.5 minutes, REM latency 101 minutes, awake after sleep onset 51.5 minutes, and arousal index 3.2.  Bedtime medication:  None.  RESPIRATORY DATA:  Apnea-hypopnea index (AHI) 2.4 per hour.  A total of 12 events was scored, all was hypopneas associated with supine sleep position and REM.  REM AHI 19.7 per hour.  There were insufficient numbers of events to permit application of split protocol CPAP titration on this study.  OXYGEN DATA:  Moderately loud snoring with oxygen desaturation to a nadir of 79% and mean oxygen saturation through the study of 92.5% on room air.  CARDIAC DATA:  Sinus rhythm with occasional PAC.  MOVEMENT-PARASOMNIA:  No significant movement disturbance.  No bathroom trips.  IMPRESSIONS-RECOMMENDATION: 1. Sleep architecture not remarkable for sleep center environment.     Reduced percentage time spent in rapid eye movement. 2. Occasional respiratory events with sleep disturbance, within normal     limits.  Apnea-hypopnea index (AHI) 2.4     per hour(the normal range for adults is from 0-5 events per     hour).  Moderately loud  snoring with oxygen desaturation to a nadir     of 79% and mean oxygen saturation through the study of 92.5% on     room air.     Lynn Pegg D. Maple Hudson, MD, [facility], FACP Diplomate, American Board of Sleep Medicine    CDY/MEDQ  D:  04/30/2011 13:52:01  T:  04/30/2011 15:10:13  Job:  [id number]

## 2011-05-10 ENCOUNTER — Ambulatory Visit (INDEPENDENT_AMBULATORY_CARE_PROVIDER_SITE_OTHER): Payer: 59 | Admitting: Internal Medicine

## 2011-05-10 ENCOUNTER — Encounter: Payer: Self-pay | Admitting: Internal Medicine

## 2011-05-10 VITALS — BP 140/86 | HR 86 | Ht 64.5 in | Wt 168.0 lb

## 2011-05-10 DIAGNOSIS — R0989 Other specified symptoms and signs involving the circulatory and respiratory systems: Secondary | ICD-10-CM

## 2011-05-10 DIAGNOSIS — J984 Other disorders of lung: Secondary | ICD-10-CM

## 2011-05-10 DIAGNOSIS — R6889 Other general symptoms and signs: Secondary | ICD-10-CM

## 2011-05-10 DIAGNOSIS — G4700 Insomnia, unspecified: Secondary | ICD-10-CM

## 2011-05-10 DIAGNOSIS — F5104 Psychophysiologic insomnia: Secondary | ICD-10-CM

## 2011-05-10 NOTE — Progress Notes (Signed)
04/06/11- 65 yoF never smoker followed for history of lung nodules, rhinitis.  PCP Dr Laural Benes LOV-03/02/10 2 left lower lobe nodules, 5 mm each, noncalcified, were found on CT done for partial bowel obstruction by diverticulitis in 2011. No history of TB exposure. History of allergic rhinitis. She comes now complaining of occasional throat tightness especially when lying supine but sometimes when eating or drinking. Sometimes worse. Snores loudly. Wakes every hour at night. Using Nasonex.  05/10/11- 13 yoF never smoker followed for history of lung nodules, rhinitis.  PCP Dr Laural Benes  Pt states "tightness in throat" has not changed since the last appt. No new complaints today. Occasional throat "strangled" not particularly restricted to eating or drinking. Little cough or wheeze. Unaware of reflux. Denies sweats or fever. She is concerned about the lung nodule issue because an uncle and aunt both died of lung cancer (both smoked). CT chest 04/11/11- reviewed images w/ her IMPRESSION:  Similarly sized and shaped rounded well circumscribed nodules in  both lower lobes, 6 mm in the right lower lobe and 5 mm in the left  lower lobe. These are nonspecific and warrant followup. If the  patient is at high risk for bronchogenic carcinoma, follow-up chest  CT at 6-12 months is recommended. If the patient is at low risk  for bronchogenic carcinoma, follow-up chest CT at 12 months is  recommended. This recommendation follows the consensus statement:  Guidelines for Management of Small Pulmonary Nodules Detected on CT  Scans: A Statement from the Fleischner Society as published in  Radiology 2005; 237:395-400.  Original Report Authenticated By: Cyndie Chime, M.D.  NPSG- 04/27/11-not OSA- AHI 2.4/ hr with moderate snoring. Complains of difficulty initiating and maintaining sleep and is interested in behavioral therapy referral.  ROS-see HPI Constitutional:   No-   weight loss, night sweats, fevers, chills,  fatigue, lassitude. HEENT:   No-  headaches,  +difficulty swallowing, tooth/dental problems, sore throat,       No-  sneezing, itching, ear ache, nasal congestion, post nasal drip,  CV:  No-   chest pain, orthopnea, PND, swelling in lower extremities, anasarca, dizziness, palpitations Resp: No-   shortness of breath with exertion or at rest.              No-   productive cough,  No non-productive cough,  No- coughing up of blood.              No-   change in color of mucus.  No- wheezing.   Skin: No-   rash or lesions. GI:  No-   heartburn, indigestion, abdominal pain, nausea, vomiting,  GU: No-   dysuria, . MS:  No-   joint pain or swelling.  Neuro-     nothing unusual Psych:  No- change in mood or affect. No depression or anxiety.  No memory loss.  OBJ- Physical Exam General- Alert, Oriented, Affect-appropriate, Distress- none acute Skin- rash-none, lesions- none, excoriation- none Lymphadenopathy- none Head- atraumatic            Eyes- Gross vision intact, PERRLA, conjunctivae and secretions clear            Ears- Hearing, canals-normal            Nose- Clear- not congested, no-Septal dev, mucus, polyps, erosion, perforation             Throat- Mallampati II , mucosa clear , drainage- none, tonsils- atrophic Neck- flexible , trachea midline, no stridor , thyroid nl, carotid  no bruit Chest - symmetrical excursion , unlabored           Heart/CV- RRR , no murmur , no gallop  , no rub, nl s1 s2                           - JVD- none , edema- none, stasis changes- none, varices- none           Lung- clear to P&A, wheeze- none, cough- none , dullness-none, rub- none           Chest wall-  Abd-  Br/ Gen/ Rectal- Not done, not indicated Extrem- cyanosis- none, clubbing, none, atrophy- none, strength- nl Neuro- grossly intact to observation

## 2011-05-10 NOTE — Patient Instructions (Signed)
When you return in 6 mos, we will make arrangements for a follow-up CT   Please call sooner as needed

## 2011-05-13 DIAGNOSIS — F5104 Psychophysiologic insomnia: Secondary | ICD-10-CM | POA: Insufficient documentation

## 2011-05-13 NOTE — Assessment & Plan Note (Signed)
We reviewed the images. She would feel as comfortable with six-month followup. These are too small to biopsy currently.

## 2011-05-13 NOTE — Assessment & Plan Note (Signed)
Nonspecific, non-progressive, intermittent without affecting speech or swallowing. Nothing is evident from oral examination with my tools. Discussed option for ENT evaluation. She feels comfortable waiting.

## 2011-05-13 NOTE — Assessment & Plan Note (Signed)
Basics of sleep hygiene been reviewed and available treatments discussed. She agrees to referral for cognitive behavioral therapy before trying more medications. Sleep apnea was excluded by NPSG.

## 2011-05-16 ENCOUNTER — Ambulatory Visit: Payer: 59 | Admitting: Internal Medicine

## 2011-05-20 ENCOUNTER — Other Ambulatory Visit: Payer: Self-pay | Admitting: Internal Medicine

## 2011-05-20 DIAGNOSIS — M79662 Pain in left lower leg: Secondary | ICD-10-CM

## 2011-05-23 ENCOUNTER — Ambulatory Visit
Admission: RE | Admit: 2011-05-23 | Discharge: 2011-05-23 | Disposition: A | Discharge status: Home / Self Care | Payer: 59 | Source: Ambulatory Visit | Attending: Internal Medicine | Admitting: Internal Medicine

## 2011-05-23 DIAGNOSIS — M79662 Pain in left lower leg: Secondary | ICD-10-CM

## 2011-08-08 ENCOUNTER — Other Ambulatory Visit: Payer: Self-pay | Admitting: Internal Medicine

## 2011-11-10 ENCOUNTER — Ambulatory Visit (INDEPENDENT_AMBULATORY_CARE_PROVIDER_SITE_OTHER): Payer: Medicare Other | Admitting: Internal Medicine

## 2011-11-10 ENCOUNTER — Ambulatory Visit (INDEPENDENT_AMBULATORY_CARE_PROVIDER_SITE_OTHER)
Admission: RE | Admit: 2011-11-10 | Discharge: 2011-11-10 | Disposition: A | Discharge status: Home / Self Care | Payer: Medicare Other | Source: Ambulatory Visit | Attending: Internal Medicine | Admitting: Internal Medicine

## 2011-11-10 ENCOUNTER — Encounter: Payer: Self-pay | Admitting: Internal Medicine

## 2011-11-10 VITALS — BP 124/76 | HR 74 | Ht 64.5 in | Wt 170.0 lb

## 2011-11-10 DIAGNOSIS — M79605 Pain in left leg: Secondary | ICD-10-CM

## 2011-11-10 DIAGNOSIS — J984 Other disorders of lung: Secondary | ICD-10-CM

## 2011-11-10 DIAGNOSIS — Z23 Encounter for immunization: Secondary | ICD-10-CM

## 2011-11-10 DIAGNOSIS — R918 Other nonspecific abnormal finding of lung field: Secondary | ICD-10-CM

## 2011-11-10 DIAGNOSIS — M949 Disorder of cartilage, unspecified: Secondary | ICD-10-CM

## 2011-11-10 DIAGNOSIS — M898X9 Other specified disorders of bone, unspecified site: Secondary | ICD-10-CM

## 2011-11-10 DIAGNOSIS — M899 Disorder of bone, unspecified: Secondary | ICD-10-CM

## 2011-11-10 DIAGNOSIS — M79609 Pain in unspecified limb: Secondary | ICD-10-CM

## 2011-11-10 NOTE — Patient Instructions (Addendum)
Order- CXR   Dx lung nodules  Order- xray Left femur      Bone pain  Follow this up with your primary physician  Flu vax

## 2011-11-10 NOTE — Progress Notes (Signed)
Quick Note:  Xray of left upper leg was negative. If it keeps hurting, she needs to let her primary doctor see about it. It is not normal to hurt    ______

## 2011-11-10 NOTE — Progress Notes (Signed)
04/06/11- 65 yoF never smoker followed for history of lung nodules, rhinitis.  PCP Dr Laural Benes LOV-03/02/10 2 left lower lobe nodules, 5 mm each, noncalcified, were found on CT done for partial bowel obstruction by diverticulitis in 2011. No history of TB exposure. History of allergic rhinitis. She comes now complaining of occasional throat tightness especially when lying supine but sometimes when eating or drinking. Sometimes worse. Snores loudly. Wakes every hour at night. Using Nasonex.  05/10/11- 52 yoF never smoker followed for history of lung nodules, rhinitis.  PCP Dr Laural Benes  Pt states "tightness in throat" has not changed since the last appt. No new complaints today. Occasional throat "strangled" not particularly restricted to eating or drinking. Little cough or wheeze. Unaware of reflux. Denies sweats or fever. She is concerned about the lung nodule issue because an uncle and aunt both died of lung cancer (both smoked). CT chest 04/11/11- reviewed images w/ her IMPRESSION:  Similarly sized and shaped rounded well circumscribed nodules in  both lower lobes, 6 mm in the right lower lobe and 5 mm in the left  lower lobe. These are nonspecific and warrant followup. If the  patient is at high risk for bronchogenic carcinoma, follow-up chest  CT at 6-12 months is recommended. If the patient is at low risk  for bronchogenic carcinoma, follow-up chest CT at 12 months is  recommended. This recommendation follows the consensus statement:  Guidelines for Management of Small Pulmonary Nodules Detected on CT  Scans: A Statement from the Fleischner Society as published in  Radiology 2005; 237:395-400.  Original Report Authenticated By: Cyndie Chime, M.D.  NPSG- 04/27/11-not OSA- AHI 2.4/ hr with moderate snoring. Complains of difficulty initiating and maintaining sleep and is interested in behavioral therapy referral.  11/10/11- 66 yoF never smoker followed for history of lung nodules, rhinitis.  PCP Dr  Laural Benes Would rather have CXR only no CT scan unless really needed; still having slight tightness in throat. Has reservations about more radiation from another CT. We discussed this, and the sensitivity difference for small nodules between CXR and CT. Reviewed images from 04/15/11 CT.  Still minor wheeze and throat tight. Denies any reflux sensation ever. No PN drip.  Persistent ache L femur x 6 months- wakes her.   ROS-see HPI Constitutional:   No-   weight loss, night sweats, fevers, chills, fatigue, lassitude. HEENT:   No-  headaches,  +difficulty swallowing, tooth/dental problems, sore throat,       No-  sneezing, itching, ear ache, nasal congestion, post nasal drip,  CV:  No-   chest pain, orthopnea, PND, swelling in lower extremities, anasarca, dizziness, palpitations Resp: No-   shortness of breath with exertion or at rest.              No-   productive cough,  No non-productive cough,  No- coughing up of blood.              No-   change in color of mucus.  + wheezing.   Skin: No-   rash or lesions. GI:  No-   heartburn, indigestion, abdominal pain, nausea, vomiting,  GU: . MS:  No-   joint pain or swelling.  Neuro-     nothing unusual Psych:  No- change in mood or affect. No depression or anxiety.  No memory loss.  OBJ- Physical Exam General- Alert, Oriented, Affect-appropriate, Distress- none acute Skin- rash-none, lesions- none, excoriation- none Lymphadenopathy- none Head- atraumatic  Eyes- Gross vision intact, PERRLA, conjunctivae and secretions clear            Ears- Hearing, canals-normal            Nose- Clear- not congested, no-Septal dev, mucus, polyps, erosion, perforation             Throat- Mallampati II , mucosa clear , drainage- none, tonsils- atrophic Neck- flexible , trachea midline, no stridor , thyroid nl, carotid no bruit Chest - symmetrical excursion , unlabored           Heart/CV- RRR , no murmur , no gallop  , no rub, nl s1 s2                            - JVD- none , edema- none, stasis changes- none, varices- none           Lung- clear to P&A, wheeze- none, cough- none , dullness-none, rub- none           Chest wall-  Abd-  Br/ Gen/ Rectal- Not done, not indicated Extrem- cyanosis- none, clubbing, none, atrophy- none, strength- nl Neuro- grossly intact to observation

## 2011-11-11 ENCOUNTER — Telehealth: Payer: Self-pay | Admitting: Internal Medicine

## 2011-11-11 NOTE — Telephone Encounter (Signed)
Notes Recorded by Waymon Budge, MD on 11/10/2011 at 4:28 PM CXR- Can no longer see the lung nodules, so they are not likely to be worse. Ok to wait on repeat CT until next ov as planned.  Bones look thinned out- recommend discuss a bone- density test with her primary doctor. Notes Recorded by Waymon Budge, MD on 11/10/2011 at 4:43 PM Correction- Xray of left upper leg was negative, with no reason for pain seen. If is keeps hurting, she should check with her primary physician. It is not normal to hurt  I spoke with patient about results and she verbalized understanding and had no questions.

## 2011-11-15 DIAGNOSIS — M79605 Pain in left leg: Secondary | ICD-10-CM | POA: Insufficient documentation

## 2011-11-15 NOTE — Assessment & Plan Note (Signed)
Plan- Xray femur. F/u with her PCP- she understands this is not in my scope.

## 2011-11-15 NOTE — Assessment & Plan Note (Signed)
She agreed to get CXR now, then a "12 month" CT on return in April, 2014.  Flu vax

## 2011-12-21 ENCOUNTER — Telehealth: Payer: Self-pay | Admitting: Internal Medicine

## 2011-12-21 NOTE — Telephone Encounter (Signed)
Pt aware that results have been sent to her PCP.

## 2012-02-02 ENCOUNTER — Ambulatory Visit (INDEPENDENT_AMBULATORY_CARE_PROVIDER_SITE_OTHER): Payer: Medicare Other | Admitting: Adult Health

## 2012-02-02 ENCOUNTER — Encounter: Payer: Self-pay | Admitting: Adult Health

## 2012-02-02 VITALS — BP 124/64 | HR 85 | Temp 97.6°F | Ht 64.0 in | Wt 170.2 lb

## 2012-02-02 DIAGNOSIS — R05 Cough: Secondary | ICD-10-CM | POA: Insufficient documentation

## 2012-02-02 DIAGNOSIS — T464X5A Adverse effect of angiotensin-converting-enzyme inhibitors, initial encounter: Secondary | ICD-10-CM

## 2012-02-02 DIAGNOSIS — R059 Cough, unspecified: Secondary | ICD-10-CM

## 2012-02-02 DIAGNOSIS — T44905A Adverse effect of unspecified drugs primarily affecting the autonomic nervous system, initial encounter: Secondary | ICD-10-CM

## 2012-02-02 MED ORDER — HYDROCODONE-HOMATROPINE 5-1.5 MG/5ML PO SYRP: 5.0000 mL | ORAL_SOLUTION | Freq: Four times a day (QID) | ORAL | Status: AC | PRN

## 2012-02-02 MED ORDER — PREDNISONE 10 MG PO TABS: ORAL_TABLET | ORAL | Status: DC

## 2012-02-02 NOTE — Patient Instructions (Addendum)
Prednisone taper. Over the next week. Delsym 2 teaspoons twice daily for cough. Hydromet 1-2 teaspoons every 4-6 hours as needed. For cough, may make you sleepy Allegra 180 mg daily as needed. For drainage Goal is to not cough or clear her throat. Use sugarless candy, ice chips and sips of water, to help avoid coughing and throat clearing. Avoid all med products. Begin Prilosec 20 mg over-the-counter once daily for 2 weeks. Follow with Dr. Maple Hudson in 4 weeks and as needed Please contact office for sooner follow up if symptoms do not improve or worsen or seek emergency care

## 2012-02-02 NOTE — Progress Notes (Signed)
04/06/11- 65 yoF never smoker followed for history of lung nodules, rhinitis.  PCP Dr Laural Benes LOV-03/02/10 2 left lower lobe nodules, 5 mm each, noncalcified, were found on CT done for partial bowel obstruction by diverticulitis in 2011. No history of TB exposure. History of allergic rhinitis. She comes now complaining of occasional throat tightness especially when lying supine but sometimes when eating or drinking. Sometimes worse. Snores loudly. Wakes every hour at night. Using Nasonex.  05/10/11- 80 yoF never smoker followed for history of lung nodules, rhinitis.  PCP Dr Laural Benes  Pt states "tightness in throat" has not changed since the last appt. No new complaints today. Occasional throat "strangled" not particularly restricted to eating or drinking. Little cough or wheeze. Unaware of reflux. Denies sweats or fever. She is concerned about the lung nodule issue because an uncle and aunt both died of lung cancer (both smoked). CT chest 04/11/11- reviewed images w/ her IMPRESSION:  Similarly sized and shaped rounded well circumscribed nodules in  both lower lobes, 6 mm in the right lower lobe and 5 mm in the left  lower lobe. These are nonspecific and warrant followup. If the  patient is at high risk for bronchogenic carcinoma, follow-up chest  CT at 6-12 months is recommended. If the patient is at low risk  for bronchogenic carcinoma, follow-up chest CT at 12 months is  recommended. This recommendation follows the consensus statement:  Guidelines for Management of Small Pulmonary Nodules Detected on CT  Scans: A Statement from the Fleischner Society as published in  Radiology 2005; 237:395-400.  Original Report Authenticated By: Cyndie Chime, M.D.  NPSG- 04/27/11-not OSA- AHI 2.4/ hr with moderate snoring. Complains of difficulty initiating and maintaining sleep and is interested in behavioral therapy referral.  11/10/11- 66 yoF never smoker followed for history of lung nodules, rhinitis.  PCP Dr  Laural Benes Would rather have CXR only no CT scan unless really needed; still having slight tightness in throat. Has reservations about more radiation from another CT. We discussed this, and the sensitivity difference for small nodules between CXR and CT. Reviewed images from 04/15/11 CT.  Still minor wheeze and throat tight. Denies any reflux sensation ever. No PN drip.  Persistent ache L femur x 6 months- wakes her.   02/02/2012 Acute OV  Complains of occasional "nagging" cough, tickle/irritation in throat, occasional hoarseness, keeps her aware at night x1 month.  Says she was recently on what sounds like ACE inhitbitor . Her PCP stopped this medication 1 month ago and started Losartan per pt. But cough did not go away.  Feels fine, just has dry hacking cough that is aggravating during day and At bedtime  .  Feels something is stuck in throat.  Cough is waking her up at night.  No hemoptysis , chest pain , edema, discolored mucus ,or  fever.    ROS-see HPI Constitutional:   No  weight loss, night sweats,  Fevers, chills, fatigue, or  lassitude.  HEENT:   No headaches,  Difficulty swallowing,  Tooth/dental problems, or  Sore throat,                No sneezing, itching, ear ache,  +nasal congestion, post nasal drip,   CV:  No chest pain,  Orthopnea, PND, swelling in lower extremities, anasarca, dizziness, palpitations, syncope.   GI  No heartburn, indigestion, abdominal pain, nausea, vomiting, diarrhea, change in bowel habits, loss of appetite, bloody stools.   RESP ;  No chest wall deformity  Skin: no rash or lesions.  GU: no dysuria, change in color of urine, no urgency or frequency.  No flank pain, no hematuria   MS:  No joint pain or swelling.  No decreased range of motion.  No back pain.  Psych:  No change in mood or affect. No depression or anxiety.  No memory loss.           OBJ- Physical Exam GEN: A/Ox3; pleasant , NAD, well nourished   HEENT:  [place]/AT,  EACs-clear,  TMs-wnl, NOSE-clear drainage , THROAT-clear, no lesions, no postnasal drip or exudate noted.   NECK:  Supple w/ fair ROM; no JVD; normal carotid impulses w/o bruits; no thyromegaly or nodules palpated; no lymphadenopathy.  RESP  Clear  P & A; w/o, wheezes/ rales/ or rhonchi.no accessory muscle use, no dullness to percussion  CARD:  RRR, no m/r/g  , no peripheral edema, pulses intact, no cyanosis or clubbing.  GI:   Soft & nt; nml bowel sounds; no organomegaly or masses detected.  Musco: Warm bil, no deformities or joint swelling noted.   Neuro: alert, no focal deficits noted.    Skin: Warm, no lesions or rashes

## 2012-02-02 NOTE — Assessment & Plan Note (Signed)
Suspected ACE related cough  Last cxr reviewed w/ no acute changes 11/2011  If not improving will need to check cxr   Plan  Prednisone taper. Over the next week. Delsym 2 teaspoons twice daily for cough. Hydromet 1-2 teaspoons every 4-6 hours as needed. For cough, may make you sleepy Allegra 180 mg daily as needed. For drainage Goal is to not cough or clear her throat. Use sugarless candy, ice chips and sips of water, to help avoid coughing and throat clearing. Avoid all med products. Begin Prilosec 20 mg over-the-counter once daily for 2 weeks. Follow with Dr. Maple Hudson in 4 weeks and as needed Please contact office for sooner follow up if symptoms do not improve or worsen or seek emergency care

## 2012-03-06 ENCOUNTER — Ambulatory Visit: Payer: Medicare Other | Admitting: Internal Medicine

## 2012-05-09 ENCOUNTER — Ambulatory Visit (INDEPENDENT_AMBULATORY_CARE_PROVIDER_SITE_OTHER): Payer: Medicare Other | Admitting: Internal Medicine

## 2012-05-09 ENCOUNTER — Encounter: Payer: Self-pay | Admitting: Internal Medicine

## 2012-05-09 VITALS — BP 110/66 | HR 81 | Ht 64.5 in | Wt 169.6 lb

## 2012-05-09 DIAGNOSIS — J302 Other seasonal allergic rhinitis: Secondary | ICD-10-CM

## 2012-05-09 DIAGNOSIS — R918 Other nonspecific abnormal finding of lung field: Secondary | ICD-10-CM

## 2012-05-09 DIAGNOSIS — J309 Allergic rhinitis, unspecified: Secondary | ICD-10-CM

## 2012-05-09 DIAGNOSIS — J984 Other disorders of lung: Secondary | ICD-10-CM

## 2012-05-09 MED ORDER — AZELASTINE-FLUTICASONE 137-50 MCG/ACT NA SUSP: 2.0000 | Freq: Every day | NASAL | Status: DC

## 2012-05-09 NOTE — Progress Notes (Signed)
04/06/11- 65 yoF never smoker followed for history of lung nodules, rhinitis.  PCP Dr Laural Benes LOV-03/02/10 2 left lower lobe nodules, 5 mm each, noncalcified, were found on CT done for partial bowel obstruction by diverticulitis in 2011. No history of TB exposure. History of allergic rhinitis. She comes now complaining of occasional throat tightness especially when lying supine but sometimes when eating or drinking. Sometimes worse. Snores loudly. Wakes every hour at night. Using Nasonex.  05/10/11- 79 yoF never smoker followed for history of lung nodules, rhinitis.  PCP Dr Laural Benes  Pt states "tightness in throat" has not changed since the last appt. No new complaints today. Occasional throat "strangled" not particularly restricted to eating or drinking. Little cough or wheeze. Unaware of reflux. Denies sweats or fever. She is concerned about the lung nodule issue because an uncle and aunt both died of lung cancer (both smoked). CT chest 04/11/11- reviewed images w/ her IMPRESSION:  Similarly sized and shaped rounded well circumscribed nodules in  both lower lobes, 6 mm in the right lower lobe and 5 mm in the left  lower lobe. These are nonspecific and warrant followup. If the  patient is at high risk for bronchogenic carcinoma, follow-up chest  CT at 6-12 months is recommended. If the patient is at low risk  for bronchogenic carcinoma, follow-up chest CT at 12 months is  recommended. This recommendation follows the consensus statement:  Guidelines for Management of Small Pulmonary Nodules Detected on CT  Scans: A Statement from the [employer] as published in  Radiology 2005; 237:395-400.  Original Report Authenticated By: Cyndie Chime, M.D.  NPSG- 04/27/11-not OSA- AHI 2.4/ hr with moderate snoring. Complains of difficulty initiating and maintaining sleep and is interested in behavioral therapy referral.  11/10/11- 66 yoF never smoker followed for history of lung nodules, rhinitis.  PCP Dr  Laural Benes Would rather have CXR only no CT scan unless really needed; still having slight tightness in throat. Has reservations about more radiation from another CT. We discussed this, and the sensitivity difference for small nodules between CXR and CT. Reviewed images from 04/15/11 CT.  Still minor wheeze and throat tight. Denies any reflux sensation ever. No PN drip.  Persistent ache L femur x 6 months- wakes her.   02/02/2012 Acute OV  Complains of occasional "nagging" cough, tickle/irritation in throat, occasional hoarseness, keeps her aware at night x1 month.  Says she was recently on what sounds like ACE inhitbitor . Her PCP stopped this medication 1 month ago and started Losartan per pt. But cough did not go away.  Feels fine, just has dry hacking cough that is aggravating during day and At bedtime  .  Feels something is stuck in throat.  Cough is waking her up at night.  No hemoptysis , chest pain , edema, discolored mucus or  fever.   05/09/12- 66 yoF never smoker followed for history of lung nodules, rhinitis.  PCP Dr Laural Benes FOLLOWS FOR: feels much better since last seen by TP in 01-2012; still continues to snore at night. On ampicillin now after gum surgery. Mild pollen rhinitis. Not coughing. She does snore. We discussed her hx of negative NPSG. CXR 11/ 8 / 13 IMPRESSION:  Changes of COPD.  Questioned nodular density at the lateral right lung base on the  previous exam is no longer identified.  Original Report Authenticated By: Ulyses Southward, M.D.  ROS-see HPI Constitutional:   No-   weight loss, night sweats, fevers, chills, fatigue, lassitude. HEENT:  No-  headaches, difficulty swallowing, tooth/dental problems, sore throat,       No-  sneezing, itching, ear ache, +nasal congestion, post nasal drip,  CV:  No-   chest pain, orthopnea, PND, swelling in lower extremities, anasarca,                                  dizziness, palpitations Resp: No-   shortness of breath with exertion  or at rest.              No-   productive cough,  No non-productive cough,  No- coughing up of blood.              No-   change in color of mucus.  No- wheezing.   Skin: No-   rash or lesions. GI:  No-   heartburn, indigestion, abdominal pain, nausea, vomiting,  GU:  MS:  No-   joint pain or swelling.   Neuro-     nothing unusual Psych:  No- change in mood or affect. No depression or anxiety.  No memory loss.   OBJ- Physical Exam General- Alert, Oriented, Affect-appropriate, Distress- none acute Skin- rash-none, +bruise left mandible after recent gum surgery Lymphadenopathy- none Head- atraumatic            Eyes- Gross vision intact, PERRLA, conjunctivae and secretions clear            Ears- Hearing, canals-normal            Nose- Clear, no-Septal dev, mucus, polyps, erosion, perforation             Throat- Mallampati II , mucosa clear , drainage- none, tonsils- atrophic Neck- flexible , trachea midline, no stridor , thyroid nl, carotid no bruit Chest - symmetrical excursion , unlabored           Heart/CV- RRR , no murmur , no gallop  , no rub, nl s1 s2                           - JVD- none , edema- none, stasis changes- none, varices- none           Lung- clear to P&A, wheeze- none, cough- none , dullness-none, rub- none           Chest wall-  Abd- Br/ Gen/ Rectal- Not done, not indicated Extrem- cyanosis- none, clubbing, none, atrophy- none, strength- nl Neuro- grossly intact to observation

## 2012-05-09 NOTE — Patient Instructions (Addendum)
Order- Future CXR at next ov for dx lung nodules  Sample Dymista nasal spray    Try 1-2 puffs each nostril once daily at bedtime  Consider trying Breath Right nasal strips for stuffy nose snoring at bedtime  You could try Benadryl to help you sleep at bedtime

## 2012-05-19 NOTE — Assessment & Plan Note (Addendum)
Nodules not apparent on chest x-ray when last checked- discussed imaging difference between CT scan and chest x-ray. She is reluctant to have more CT scans but willing to have chest x-ray and understands if nodules can be seen by chest x-ray they will probably be growing.

## 2012-05-19 NOTE — Assessment & Plan Note (Signed)
Mild exacerbation. Plan-sample Dymista nasal spray for trial. Try Benadryl at bedtime

## 2012-06-28 IMAGING — US US EXTREM LOW VENOUS*L*
1 series · 14 of 24 positions shown · non-contrast
Comparison: None.

CLINICAL DATA: Left calf pain.

LEFT LOWER EXTREMITY VENOUS DUPLEX ULTRASOUND
TECHNIQUE: Gray-scale sonography with graded compression, as well
as color Doppler and duplex ultrasound were performed to evaluate
the deep venous system of the lower extremity from the level of the
common femoral vein through the popliteal and proximal calf veins.
Spectral Doppler was utilized to evaluate flow at rest and with
distal augmentation maneuvers.

[Series 1: us extrem low venous*left* · 14 of 30 slices shown]
[im 1/30]
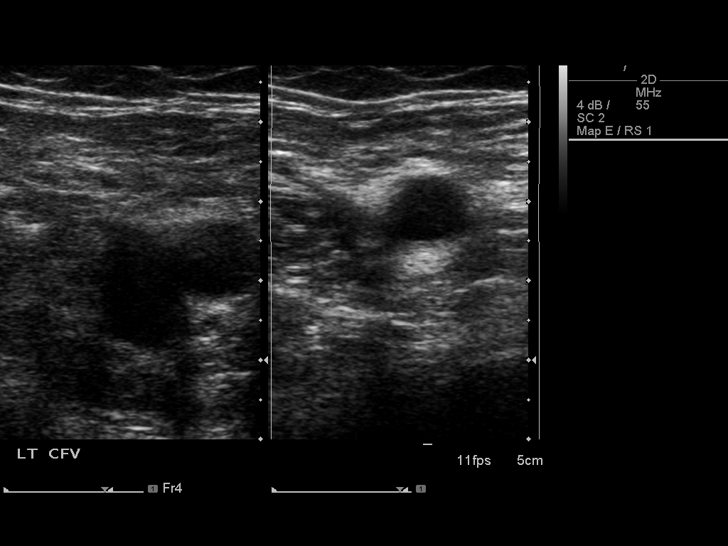
[im 3/30]
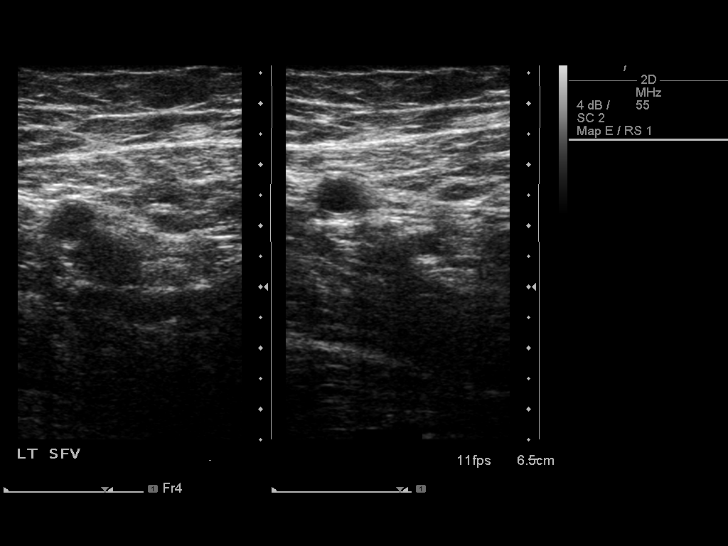
[im 6/30]
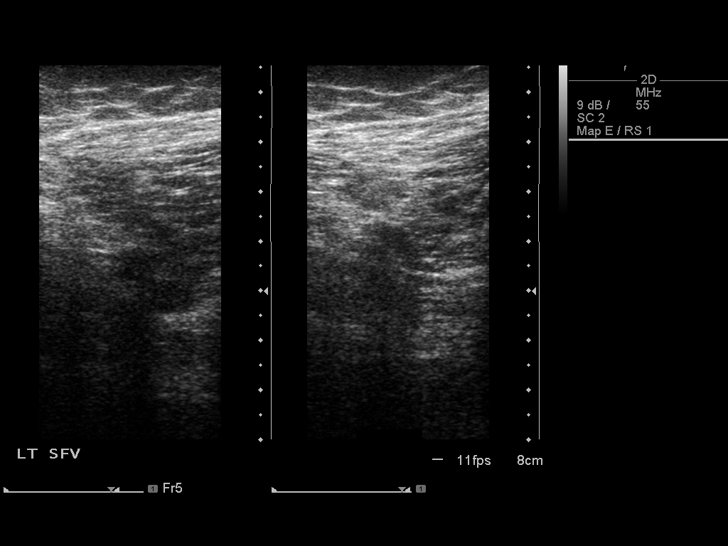
[im 8/30]
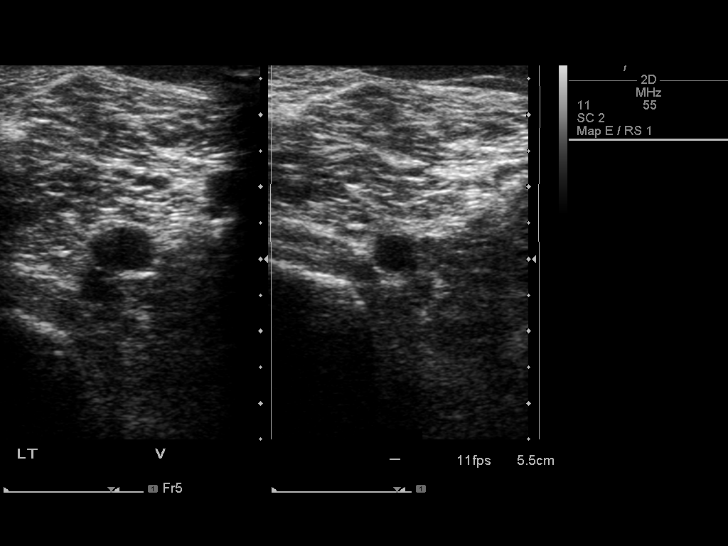
[im 9/30]
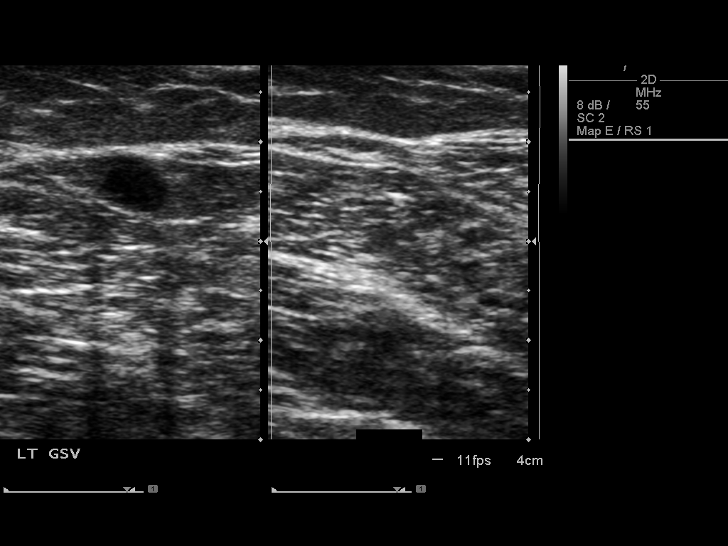
[im 12/30]
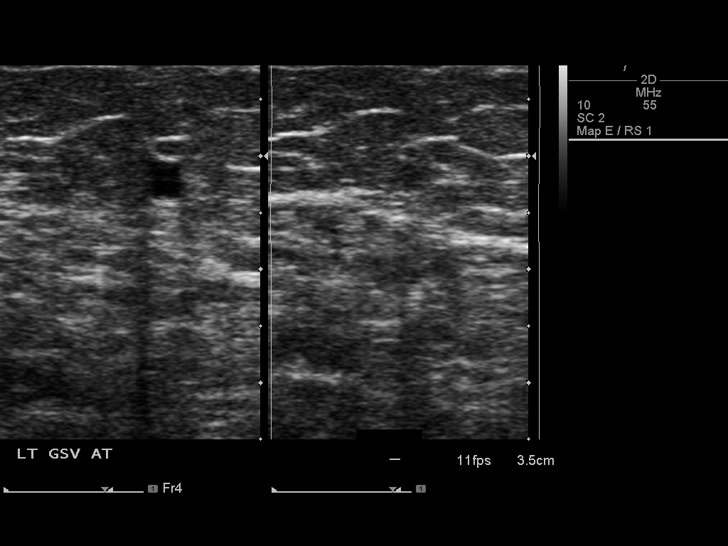
[im 14/30]
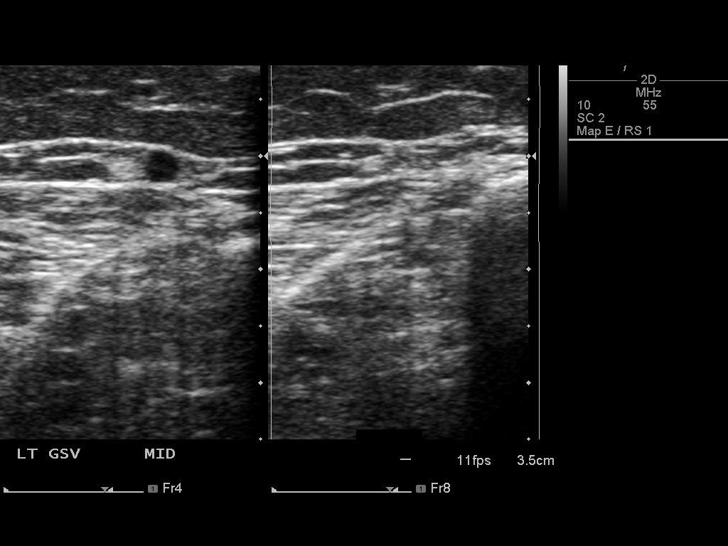
[im 16/30]
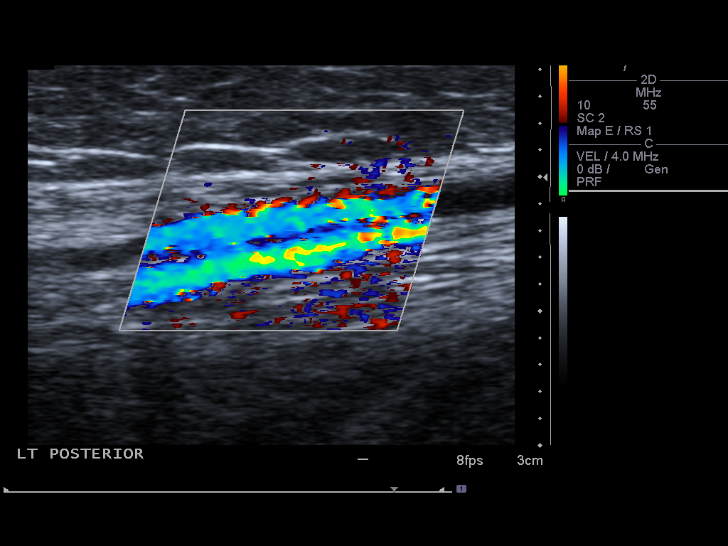
[im 18/30]
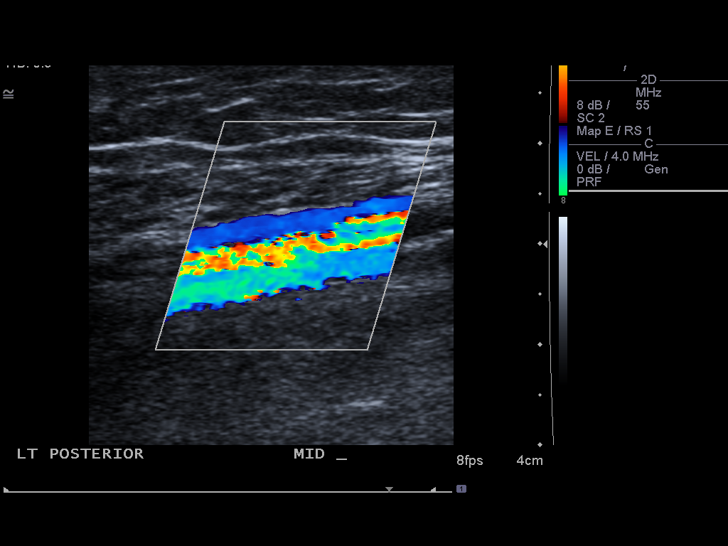
[im 21/30]
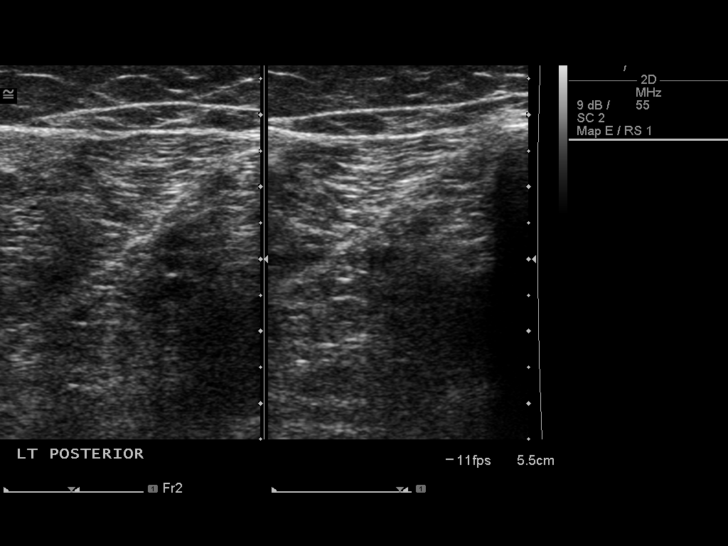
[im 23/30]
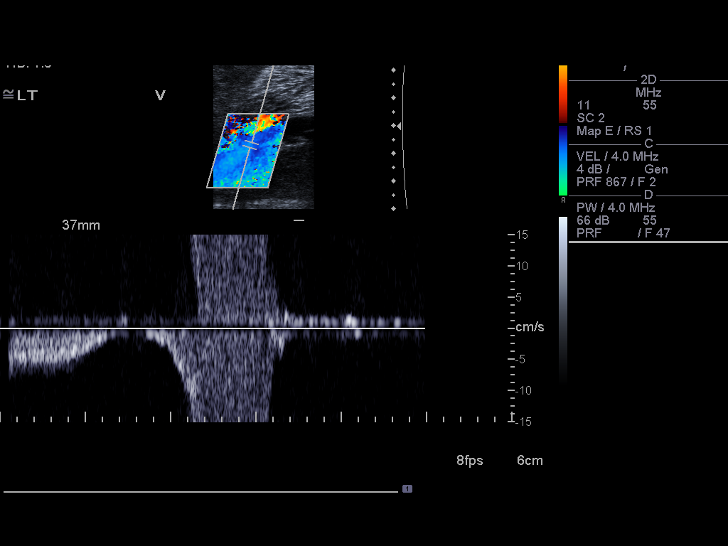
[im 24/30]
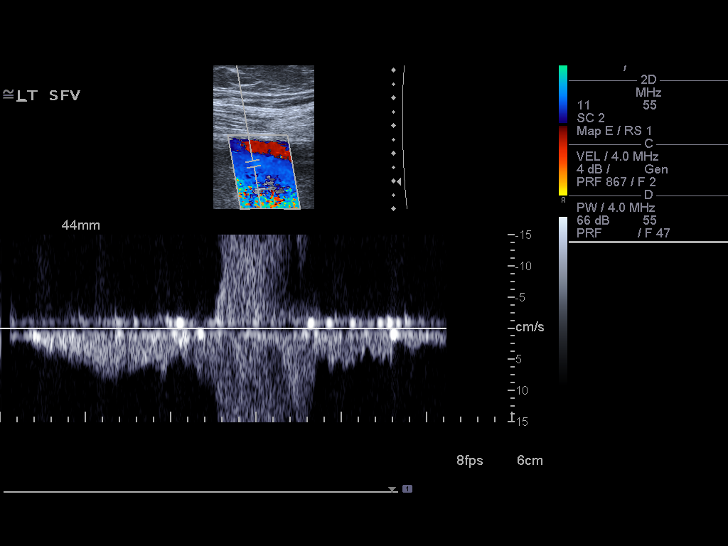
[im 27/30]
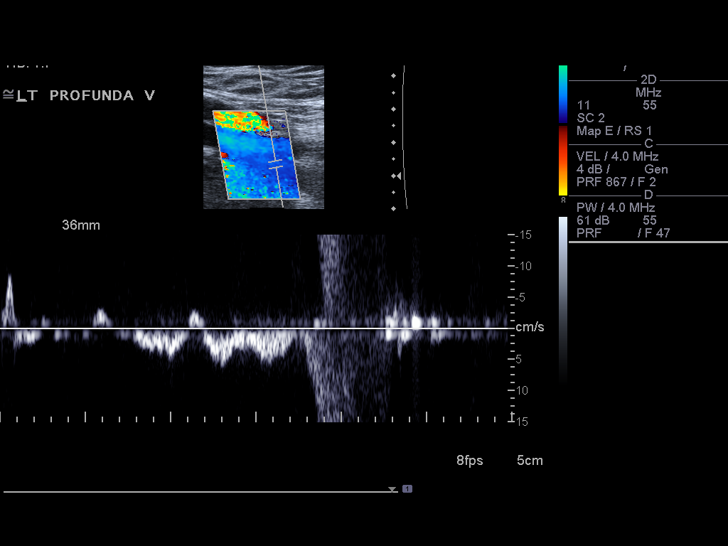
[im 30/30]
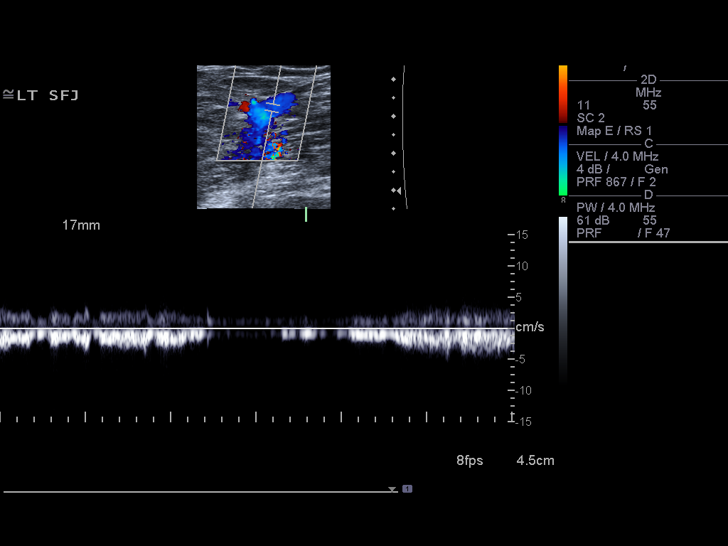

[14 of 24 positions shown; findings below may reference images not displayed]

FINDINGS: Normal compressibility of the common femoral,
superficial femoral, and popliteal veins is demonstrated, as well
as the visualized proximal calf veins.  No filling defects to
suggest DVT on grayscale or color Doppler imaging.  Doppler
waveforms show normal direction of venous flow, normal respiratory
phasicity and response to augmentation.
IMPRESSION: No evidence of left lower extremity deep vein thrombosis.

## 2012-11-09 ENCOUNTER — Ambulatory Visit: Payer: Medicare Other | Admitting: Internal Medicine

## 2012-11-14 ENCOUNTER — Encounter: Payer: Self-pay | Admitting: Internal Medicine

## 2012-11-22 ENCOUNTER — Ambulatory Visit (INDEPENDENT_AMBULATORY_CARE_PROVIDER_SITE_OTHER): Payer: Medicare Other | Admitting: Internal Medicine

## 2012-11-22 ENCOUNTER — Ambulatory Visit (INDEPENDENT_AMBULATORY_CARE_PROVIDER_SITE_OTHER)
Admission: RE | Admit: 2012-11-22 | Discharge: 2012-11-22 | Disposition: A | Discharge status: Home / Self Care | Payer: Medicare Other | Source: Ambulatory Visit | Attending: Internal Medicine | Admitting: Internal Medicine

## 2012-11-22 ENCOUNTER — Encounter: Payer: Self-pay | Admitting: Internal Medicine

## 2012-11-22 VITALS — BP 116/74 | HR 73 | Ht 64.5 in | Wt 171.6 lb

## 2012-11-22 DIAGNOSIS — J984 Other disorders of lung: Secondary | ICD-10-CM

## 2012-11-22 DIAGNOSIS — R918 Other nonspecific abnormal finding of lung field: Secondary | ICD-10-CM

## 2012-11-22 DIAGNOSIS — J302 Other seasonal allergic rhinitis: Secondary | ICD-10-CM

## 2012-11-22 DIAGNOSIS — J309 Allergic rhinitis, unspecified: Secondary | ICD-10-CM

## 2012-11-22 DIAGNOSIS — Z23 Encounter for immunization: Secondary | ICD-10-CM

## 2012-11-22 MED ORDER — AZELASTINE-FLUTICASONE 137-50 MCG/ACT NA SUSP: 2.0000 | Freq: Every day | NASAL | Status: DC

## 2012-11-22 NOTE — Patient Instructions (Signed)
Flu vax  CXR today- already ordered  Sample of Dymista nasal spray -- try 1 or 2 puffs each nostril once daily at bedtime

## 2012-11-22 NOTE — Progress Notes (Signed)
04/06/11- 65 yoF never smoker followed for history of lung nodules, rhinitis.  PCP Dr Wynetta Emery LOV-03/02/10 2 left lower lobe nodules, 5 mm each, noncalcified, were found on CT done for partial bowel obstruction by diverticulitis in 2011. No history of TB exposure. History of allergic rhinitis. She comes now complaining of occasional throat tightness especially when lying supine but sometimes when eating or drinking. Sometimes worse. Snores loudly. Wakes every hour at night. Using Nasonex.  05/10/11- 60 yoF never smoker followed for history of lung nodules, rhinitis.  PCP Dr Wynetta Emery  Pt states "tightness in throat" has not changed since the last appt. No new complaints today. Occasional throat "strangled" not particularly restricted to eating or drinking. Little cough or wheeze. Unaware of reflux. Denies sweats or fever. She is concerned about the lung nodule issue because an uncle and aunt both died of lung cancer (both smoked). CT chest 04/11/11- reviewed images w/ her IMPRESSION:  Similarly sized and shaped rounded well circumscribed nodules in  both lower lobes, 6 mm in the right lower lobe and 5 mm in the left  lower lobe. These are nonspecific and warrant followup. If the  patient is at high risk for bronchogenic carcinoma, follow-up chest  CT at 6-12 months is recommended. If the patient is at low risk  for bronchogenic carcinoma, follow-up chest CT at 12 months is  recommended. This recommendation follows the consensus statement:  Guidelines for Management of Small Pulmonary Nodules Detected on CT  Scans: A Statement from the [employer] as published in  Radiology 2005; 237:395-400.  Original Report Authenticated By: Raelyn Number, M.D.  NPSG- 04/27/11-not OSA- AHI 2.4/ hr with moderate snoring. Complains of difficulty initiating and maintaining sleep and is interested in behavioral therapy referral.  11/10/11- 66 yoF never smoker followed for history of lung nodules, rhinitis.  PCP Dr  Wynetta Emery Would rather have CXR only no CT scan unless really needed; still having slight tightness in throat. Has reservations about more radiation from another CT. We discussed this, and the sensitivity difference for small nodules between CXR and CT. Reviewed images from 04/15/11 CT.  Still minor wheeze and throat tight. Denies any reflux sensation ever. No PN drip.  Persistent ache L femur x 6 months- wakes her.   02/02/2012 Acute OV  Complains of occasional "nagging" cough, tickle/irritation in throat, occasional hoarseness, keeps her aware at night x1 month.  Says she was recently on what sounds like ACE inhitbitor . Her PCP stopped this medication 1 month ago and started Losartan per pt. But cough did not go away.  Feels fine, just has dry hacking cough that is aggravating during day and At bedtime  .  Feels something is stuck in throat.  Cough is waking her up at night.  No hemoptysis , chest pain , edema, discolored mucus or  fever.   05/09/12- 29 yoF never smoker followed for history of lung nodules, rhinitis.  PCP Dr Wynetta Emery FOLLOWS FOR: feels much better since last seen by TP in 01-2012; still continues to snore at night. On ampicillin now after gum surgery. Mild pollen rhinitis. Not coughing. She does snore. We discussed her hx of negative NPSG. CXR 11/ 8 /13 IMPRESSION:  Changes of COPD.  Questioned nodular density at the lateral right lung base on the  previous exam is no longer identified.  Original Report Authenticated By: Lavonia Dana, M.D.  11/22/12- 35 yoF never smoker followed for history of lung nodules, rhinitis.  PCP Dr Wynetta Emery FOLLOWS FOR:  still wakes up every hour with congesiton in throat. Denies any SOB, wheezing, or cough. Voice tires easily. Denies cough or wheeze. Has some nasal congestion.  ROS-see HPI Constitutional:   No-   weight loss, night sweats, fevers, chills, fatigue, lassitude. HEENT:   No-  headaches, difficulty swallowing, tooth/dental problems, sore  throat,       No-  sneezing, itching, ear ache, +nasal congestion, +post nasal drip,  CV:  No-   chest pain, orthopnea, PND, swelling in lower extremities, anasarca,  dizziness, palpitations Resp: No-   shortness of breath with exertion or at rest.              No-   productive cough,  No non-productive cough,  No- coughing up of blood.              No-   change in color of mucus.  No- wheezing.   Skin: No-   rash or lesions. GI:  No-   heartburn, indigestion, abdominal pain, nausea, vomiting,  GU:  MS:  No-   joint pain or swelling.   Neuro-     nothing unusual Psych:  No- change in mood or affect. No depression or anxiety.  No memory loss.   OBJ- Physical Exam General- Alert, Oriented, Affect-appropriate, Distress- none acute Skin- rash-none, +bruise left mandible after recent gum surgery Lymphadenopathy- none Head- atraumatic            Eyes- Gross vision intact, PERRLA, conjunctivae and secretions clear            Ears- Hearing, canals-normal            Nose- +mucus bridging and turbinate edema, no-Septal dev,  polyps, erosion, perforation             Throat- Mallampati II , mucosa clear , drainage- none, tonsils- atrophic Neck- flexible , trachea midline, no stridor , thyroid nl, carotid no bruit Chest - symmetrical excursion , unlabored           Heart/CV- RRR , no murmur , no gallop  , no rub, nl s1 s2                           - JVD- none , edema- none, stasis changes- none, varices- none           Lung- clear to P&A, wheeze- none, cough- none , dullness-none, rub- none           Chest wall-  Abd- Br/ Gen/ Rectal- Not done, not indicated Extrem- cyanosis- none, clubbing, none, atrophy- none, strength- nl Neuro- grossly intact to observation

## 2012-12-09 NOTE — Assessment & Plan Note (Signed)
Plan-chest x-ray 

## 2012-12-09 NOTE — Assessment & Plan Note (Signed)
Exacerbation rhinitis with postnasal drainage Plan-try Dymista nasal spray. Flu vaccine

## 2013-05-22 ENCOUNTER — Ambulatory Visit: Payer: Medicare Other | Admitting: Internal Medicine

## 2013-06-24 ENCOUNTER — Encounter: Payer: Self-pay | Admitting: Internal Medicine

## 2013-06-24 ENCOUNTER — Ambulatory Visit (INDEPENDENT_AMBULATORY_CARE_PROVIDER_SITE_OTHER): Payer: Medicare HMO | Admitting: Internal Medicine

## 2013-06-24 VITALS — BP 118/78 | HR 85 | Ht 64.5 in | Wt 171.0 lb

## 2013-06-24 DIAGNOSIS — J302 Other seasonal allergic rhinitis: Secondary | ICD-10-CM

## 2013-06-24 DIAGNOSIS — J309 Allergic rhinitis, unspecified: Secondary | ICD-10-CM

## 2013-06-24 DIAGNOSIS — J984 Other disorders of lung: Secondary | ICD-10-CM

## 2013-06-24 DIAGNOSIS — G4700 Insomnia, unspecified: Secondary | ICD-10-CM

## 2013-06-24 DIAGNOSIS — F5104 Psychophysiologic insomnia: Secondary | ICD-10-CM

## 2013-06-24 MED ORDER — TEMAZEPAM 15 MG PO CAPS: ORAL_CAPSULE | ORAL | Status: DC

## 2013-06-24 NOTE — Progress Notes (Signed)
04/06/11- 65 yoF never smoker followed for history of lung nodules, rhinitis.  PCP Dr Wynetta Emery LOV-03/02/10 2 left lower lobe nodules, 5 mm each, noncalcified, were found on CT done for partial bowel obstruction by diverticulitis in 2011. No history of TB exposure. History of allergic rhinitis. She comes now complaining of occasional throat tightness especially when lying supine but sometimes when eating or drinking. Sometimes worse. Snores loudly. Wakes every hour at night. Using Nasonex.  05/10/11- 60 yoF never smoker followed for history of lung nodules, rhinitis.  PCP Dr Wynetta Emery  Pt states "tightness in throat" has not changed since the last appt. No new complaints today. Occasional throat "strangled" not particularly restricted to eating or drinking. Little cough or wheeze. Unaware of reflux. Denies sweats or fever. She is concerned about the lung nodule issue because an uncle and aunt both died of lung cancer (both smoked). CT chest 04/11/11- reviewed images w/ her IMPRESSION:  Similarly sized and shaped rounded well circumscribed nodules in  both lower lobes, 6 mm in the right lower lobe and 5 mm in the left  lower lobe. These are nonspecific and warrant followup. If the  patient is at high risk for bronchogenic carcinoma, follow-up chest  CT at 6-12 months is recommended. If the patient is at low risk  for bronchogenic carcinoma, follow-up chest CT at 12 months is  recommended. This recommendation follows the consensus statement:  Guidelines for Management of Small Pulmonary Nodules Detected on CT  Scans: A Statement from the [employer] as published in  Radiology 2005; 237:395-400.  Original Report Authenticated By: Raelyn Number, M.D.  NPSG- 04/27/11-not OSA- AHI 2.4/ hr with moderate snoring. Complains of difficulty initiating and maintaining sleep and is interested in behavioral therapy referral.  11/10/11- 66 yoF never smoker followed for history of lung nodules, rhinitis.  PCP Dr  Wynetta Emery Would rather have CXR only no CT scan unless really needed; still having slight tightness in throat. Has reservations about more radiation from another CT. We discussed this, and the sensitivity difference for small nodules between CXR and CT. Reviewed images from 04/15/11 CT.  Still minor wheeze and throat tight. Denies any reflux sensation ever. No PN drip.  Persistent ache L femur x 6 months- wakes her.   02/02/2012 Acute OV  Complains of occasional "nagging" cough, tickle/irritation in throat, occasional hoarseness, keeps her aware at night x1 month.  Says she was recently on what sounds like ACE inhitbitor . Her PCP stopped this medication 1 month ago and started Losartan per pt. But cough did not go away.  Feels fine, just has dry hacking cough that is aggravating during day and At bedtime  .  Feels something is stuck in throat.  Cough is waking her up at night.  No hemoptysis , chest pain , edema, discolored mucus or  fever.   05/09/12- 29 yoF never smoker followed for history of lung nodules, rhinitis.  PCP Dr Wynetta Emery FOLLOWS FOR: feels much better since last seen by TP in 01-2012; still continues to snore at night. On ampicillin now after gum surgery. Mild pollen rhinitis. Not coughing. She does snore. We discussed her hx of negative NPSG. CXR 11/ 8 /13 IMPRESSION:  Changes of COPD.  Questioned nodular density at the lateral right lung base on the  previous exam is no longer identified.  Original Report Authenticated By: Lavonia Dana, M.D.  11/22/12- 35 yoF never smoker followed for history of lung nodules, rhinitis.  PCP Dr Wynetta Emery FOLLOWS FOR:  still wakes up every hour with congesiton in throat. Denies any SOB, wheezing, or cough. Voice tires easily. Denies cough or wheeze. Has some nasal congestion.  06/24/13- 67 yoF never smoker followed for history of lung nodules, rhinitis.  PCP Dr Wynetta Emery FOLLOWS FOR:  Reports having wheezing in morning hours.  Denies any sob.  States is  waking up every hour and feeling unrested when waking Difficulty maintaining sleep-patterns noted past for 5 years. Can't tell what wakes her. Always somewhat stuffy nose with dry mouth suggesting she is mouth breathing. Does snore. NPSG negative 2 years ago.   ??Consider f/u CXR next visit for lung nodules.?? CXR 11/22/12 IMPRESSION:  Stable exam.  Electronically Signed  By: Rolla Flatten M.D.  On: 11/22/2012 10:45  ROS-see HPI Constitutional:   No-   weight loss, night sweats, fevers, chills, fatigue, lassitude. HEENT:   No-  headaches, difficulty swallowing, tooth/dental problems, sore throat,       No-  sneezing, itching, ear ache, +nasal congestion, +post nasal drip,  CV:  No-   chest pain, orthopnea, PND, swelling in lower extremities, anasarca,  dizziness, palpitations Resp: No-   shortness of breath with exertion or at rest.              No-   productive cough,  No non-productive cough,  No- coughing up of blood.              No-   change in color of mucus.  No- wheezing.   Skin: No-   rash or lesions. GI:  No-   heartburn, indigestion, abdominal pain, nausea, vomiting,  GU:  MS:  No-   joint pain or swelling.   Neuro-     nothing unusual Psych:  No- change in mood or affect. No depression or anxiety.  No memory loss.   OBJ- Physical Exam General- Alert, Oriented, Affect-appropriate, Distress- none acute Skin- rash-none,  Lymphadenopathy- none Head- atraumatic            Eyes- Gross vision intact, PERRLA, conjunctivae and secretions clear            Ears- Hearing, canals-normal            Nose- +mucus bridging +turbinate edema, no-Septal dev,  polyps, erosion, perforation             Throat- Mallampati II , mucosa clear , drainage- none, tonsils- atrophic Neck- flexible , trachea midline, no stridor , thyroid nl, carotid no bruit Chest - symmetrical excursion , unlabored           Heart/CV- RRR , no murmur , no gallop  , no rub, nl s1 s2                           - JVD-  none , edema- none, stasis changes- none, varices- none           Lung- clear to P&A, wheeze- none, cough- none , dullness-none, rub- none           Chest wall-  Abd- Br/ Gen/ Rectal- Not done, not indicated Extrem- cyanosis- none, clubbing, none, atrophy- none, strength- nl Neuro- grossly intact to observation

## 2013-06-24 NOTE — Patient Instructions (Signed)
Script for temazepam to take for sleep if needed  Try for 1 week, using otc Afrin nasal spray    1-2 puffs each nostril one time in each nostril, just once daily at bedtime  If the loud snoring and unrefreshing sleep keep happening  We may need to consider repeating the sleep study.

## 2013-09-01 NOTE — Assessment & Plan Note (Signed)
Discussed nasal sprays, decongestants

## 2013-09-01 NOTE — Assessment & Plan Note (Signed)
Discussed sleep hygiene. Consider order line sleep apnea and possibility of repeating sleep study in the future Plan-temazepam

## 2013-09-01 NOTE — Assessment & Plan Note (Signed)
Consider chest x-ray next visit

## 2013-12-24 ENCOUNTER — Ambulatory Visit: Payer: Medicare HMO | Admitting: Internal Medicine

## 2014-01-30 ENCOUNTER — Encounter: Payer: Self-pay | Admitting: Internal Medicine

## 2014-01-30 ENCOUNTER — Ambulatory Visit (INDEPENDENT_AMBULATORY_CARE_PROVIDER_SITE_OTHER)
Admission: RE | Admit: 2014-01-30 | Discharge: 2014-01-30 | Disposition: A | Discharge status: Home / Self Care | Payer: Medicare HMO | Source: Ambulatory Visit | Attending: Internal Medicine | Admitting: Internal Medicine

## 2014-01-30 ENCOUNTER — Ambulatory Visit (INDEPENDENT_AMBULATORY_CARE_PROVIDER_SITE_OTHER): Payer: Medicare HMO | Admitting: Internal Medicine

## 2014-01-30 VITALS — BP 142/86 | HR 73 | Ht 64.5 in | Wt 174.0 lb

## 2014-01-30 DIAGNOSIS — R918 Other nonspecific abnormal finding of lung field: Secondary | ICD-10-CM

## 2014-01-30 DIAGNOSIS — F5104 Psychophysiologic insomnia: Secondary | ICD-10-CM

## 2014-01-30 DIAGNOSIS — J4520 Mild intermittent asthma, uncomplicated: Secondary | ICD-10-CM

## 2014-01-30 DIAGNOSIS — G4700 Insomnia, unspecified: Secondary | ICD-10-CM

## 2014-01-30 DIAGNOSIS — J302 Other seasonal allergic rhinitis: Secondary | ICD-10-CM

## 2014-01-30 MED ORDER — ALBUTEROL SULFATE HFA 108 (90 BASE) MCG/ACT IN AERS: INHALATION_SPRAY | RESPIRATORY_TRACT | Status: DC

## 2014-01-30 MED ORDER — TEMAZEPAM 15 MG PO CAPS: ORAL_CAPSULE | ORAL | Status: DC

## 2014-01-30 NOTE — Progress Notes (Signed)
04/06/11- 65 yoF never smoker followed for history of lung nodules, rhinitis.  PCP Dr Wynetta Emery LOV-03/02/10 2 left lower lobe nodules, 5 mm each, noncalcified, were found on CT done for partial bowel obstruction by diverticulitis in 2011. No history of TB exposure. History of allergic rhinitis. She comes now complaining of occasional throat tightness especially when lying supine but sometimes when eating or drinking. Sometimes worse. Snores loudly. Wakes every hour at night. Using Nasonex.  05/10/11- 60 yoF never smoker followed for history of lung nodules, rhinitis.  PCP Dr Wynetta Emery  Pt states "tightness in throat" has not changed since the last appt. No new complaints today. Occasional throat "strangled" not particularly restricted to eating or drinking. Little cough or wheeze. Unaware of reflux. Denies sweats or fever. She is concerned about the lung nodule issue because an uncle and aunt both died of lung cancer (both smoked). CT chest 04/11/11- reviewed images w/ her IMPRESSION:  Similarly sized and shaped rounded well circumscribed nodules in  both lower lobes, 6 mm in the right lower lobe and 5 mm in the left  lower lobe. These are nonspecific and warrant followup. If the  patient is at high risk for bronchogenic carcinoma, follow-up chest  CT at 6-12 months is recommended. If the patient is at low risk  for bronchogenic carcinoma, follow-up chest CT at 12 months is  recommended. This recommendation follows the consensus statement:  Guidelines for Management of Small Pulmonary Nodules Detected on CT  Scans: A Statement from the [employer] as published in  Radiology 2005; 237:395-400.  Original Report Authenticated By: Raelyn Number, M.D.  NPSG- 04/27/11-not OSA- AHI 2.4/ hr with moderate snoring. Complains of difficulty initiating and maintaining sleep and is interested in behavioral therapy referral.  11/10/11- 66 yoF never smoker followed for history of lung nodules, rhinitis.  PCP Dr  Wynetta Emery Would rather have CXR only no CT scan unless really needed; still having slight tightness in throat. Has reservations about more radiation from another CT. We discussed this, and the sensitivity difference for small nodules between CXR and CT. Reviewed images from 04/15/11 CT.  Still minor wheeze and throat tight. Denies any reflux sensation ever. No PN drip.  Persistent ache L femur x 6 months- wakes her.   02/02/2012 Acute OV  Complains of occasional "nagging" cough, tickle/irritation in throat, occasional hoarseness, keeps her aware at night x1 month.  Says she was recently on what sounds like ACE inhitbitor . Her PCP stopped this medication 1 month ago and started Losartan per pt. But cough did not go away.  Feels fine, just has dry hacking cough that is aggravating during day and At bedtime  .  Feels something is stuck in throat.  Cough is waking her up at night.  No hemoptysis , chest pain , edema, discolored mucus or  fever.   05/09/12- 29 yoF never smoker followed for history of lung nodules, rhinitis.  PCP Dr Wynetta Emery FOLLOWS FOR: feels much better since last seen by TP in 01-2012; still continues to snore at night. On ampicillin now after gum surgery. Mild pollen rhinitis. Not coughing. She does snore. We discussed her hx of negative NPSG. CXR 11/ 8 /13 IMPRESSION:  Changes of COPD.  Questioned nodular density at the lateral right lung base on the  previous exam is no longer identified.  Original Report Authenticated By: Lavonia Dana, M.D.  11/22/12- 35 yoF never smoker followed for history of lung nodules, rhinitis.  PCP Dr Wynetta Emery FOLLOWS FOR:  still wakes up every hour with congesiton in throat. Denies any SOB, wheezing, or cough. Voice tires easily. Denies cough or wheeze. Has some nasal congestion.  06/24/13- 67 yoF never smoker followed for history of lung nodules, rhinitis.  PCP Dr Wynetta Emery FOLLOWS FOR:  Reports having wheezing in morning hours.  Denies any sob.  States is  waking up every hour and feeling unrested when waking Difficulty maintaining sleep-patterns noted past for 5 years. Can't tell what wakes her. Always somewhat stuffy nose with dry mouth suggesting she is mouth breathing. Does snore. NPSG negative 05/03/11.   ??Consider f/u CXR next visit for lung nodules.?? CXR 11/22/12 IMPRESSION:  Stable exam.  Electronically Signed  By: Rolla Flatten M.D.  On: 11/22/2012 10:45  01/30/14- 68 yoF never smoker followed for history of lung nodules, rhinitis, asthma, insomnia  PCP Dr Wynetta Emery FOLLOWS FOR: lost Rx for Temazepam-never tried the Rx due to this. Pt states she continues to have wheezing and SOB mainly in the mornings but nothing worse than last time. She minimizes respiratory symptoms. Sometimes notices a little wheeze in the morning but feels well  ROS-see HPI Constitutional:   No-   weight loss, night sweats, fevers, chills, fatigue, lassitude. HEENT:   No-  headaches, difficulty swallowing, tooth/dental problems, sore throat,       No-  sneezing, itching, ear ache, +nasal congestion, +post nasal drip,  CV:  No-   chest pain, orthopnea, PND, swelling in lower extremities, anasarca,  dizziness, palpitations Resp: No-   shortness of breath with exertion or at rest.              No-   productive cough,  No non-productive cough,  No- coughing up of blood.              No-   change in color of mucus.  + wheezing.   Skin: No-   rash or lesions. GI:  No-   heartburn, indigestion, abdominal pain, nausea, vomiting,  GU:  MS:  No-   joint pain or swelling.   Neuro-     nothing unusual Psych:  No- change in mood or affect. No depression or anxiety.  No memory loss.   OBJ- Physical Exam General- Alert, Oriented, Affect-appropriate, Distress- none acute Skin- rash-none,  Lymphadenopathy- none Head- atraumatic            Eyes- Gross vision intact, PERRLA, conjunctivae and secretions clear            Ears- Hearing, canals-normal            Nose- +mucus  bridging +turbinate edema, no-Septal dev,  polyps, erosion, perforation             Throat- Mallampati II , mucosa clear , drainage- none, tonsils- atrophic Neck- flexible , trachea midline, no stridor , thyroid nl, carotid no bruit Chest - symmetrical excursion , unlabored           Heart/CV- RRR , no murmur , no gallop  , no rub, nl s1 s2                           - JVD- none , edema- none, stasis changes- none, varices- none           Lung- clear to P&A, wheeze- none, cough- none , dullness-none, rub- none           Chest wall-  Abd- Br/ Gen/ Rectal- Not done, not indicated  Extrem- cyanosis- none, clubbing, none, atrophy- none, strength- nl Neuro- grossly intact to observation

## 2014-01-30 NOTE — Patient Instructions (Signed)
Order- CXR  Dx  Lung nodules  Script for temazepam for sleep when needed  Script sent for albuterol rescue inhaler to use for occasional wheeze/ shortness of breath if needed

## 2014-02-02 DIAGNOSIS — J4520 Mild intermittent asthma, uncomplicated: Secondary | ICD-10-CM | POA: Insufficient documentation

## 2014-02-02 NOTE — Assessment & Plan Note (Signed)
Plan-rescue inhaler with discussion

## 2014-02-02 NOTE — Assessment & Plan Note (Signed)
Chronic nonspecific insomnia. Reviewed basic sleep hygiene. Plan-rewrite prescription to try again with temazepam as discussed

## 2014-02-02 NOTE — Assessment & Plan Note (Signed)
Apparently stable over the last 3 years. Plan-chest x-ray

## 2014-02-02 NOTE — Assessment & Plan Note (Signed)
She feels comfortable now. We are anticipating reconsideration with spring pollen.

## 2014-07-30 ENCOUNTER — Emergency Department (HOSPITAL_COMMUNITY)
Admission: EM | Admit: 2014-07-30 | Discharge: 2014-07-31 | Disposition: A | Discharge status: Home / Self Care | Payer: Medicare HMO | Attending: Emergency Medicine | Admitting: Emergency Medicine

## 2014-07-30 ENCOUNTER — Encounter (HOSPITAL_COMMUNITY): Payer: Self-pay | Admitting: Emergency Medicine

## 2014-07-30 DIAGNOSIS — K5900 Constipation, unspecified: Secondary | ICD-10-CM | POA: Insufficient documentation

## 2014-07-30 DIAGNOSIS — I10 Essential (primary) hypertension: Secondary | ICD-10-CM | POA: Insufficient documentation

## 2014-07-30 DIAGNOSIS — K5732 Diverticulitis of large intestine without perforation or abscess without bleeding: Secondary | ICD-10-CM | POA: Diagnosis not present

## 2014-07-30 DIAGNOSIS — R1032 Left lower quadrant pain: Secondary | ICD-10-CM | POA: Diagnosis present

## 2014-07-30 DIAGNOSIS — Z79899 Other long term (current) drug therapy: Secondary | ICD-10-CM | POA: Diagnosis not present

## 2014-07-30 LAB — CBC
HEMATOCRIT: 41.1 % (ref 36.0–46.0)
Hemoglobin: 14.2 g/dL (ref 12.0–15.0)
MCH: 31 pg (ref 26.0–34.0)
MCHC: 34.5 g/dL (ref 30.0–36.0)
MCV: 89.7 fL (ref 78.0–100.0)
Platelets: 278 10*3/uL (ref 150–400)
RBC: 4.58 MIL/uL (ref 3.87–5.11)
RDW: 12.9 % (ref 11.5–15.5)
WBC: 12.1 10*3/uL — AB (ref 4.0–10.5)

## 2014-07-30 LAB — COMPREHENSIVE METABOLIC PANEL
ALBUMIN: 4 g/dL (ref 3.5–5.0)
ALK PHOS: 67 U/L (ref 38–126)
ALT: 25 U/L (ref 14–54)
AST: 23 U/L (ref 15–41)
Anion gap: 12 (ref 5–15)
BUN: 11 mg/dL (ref 6–20)
CO2: 24 mmol/L (ref 22–32)
CREATININE: 0.67 mg/dL (ref 0.44–1.00)
Calcium: 9.2 mg/dL (ref 8.9–10.3)
Chloride: 96 mmol/L — ABNORMAL LOW (ref 101–111)
GLUCOSE: 114 mg/dL — AB (ref 65–99)
Potassium: 3.2 mmol/L — ABNORMAL LOW (ref 3.5–5.1)
SODIUM: 132 mmol/L — AB (ref 135–145)
Total Bilirubin: 1.1 mg/dL (ref 0.3–1.2)
Total Protein: 7.5 g/dL (ref 6.5–8.1)

## 2014-07-30 LAB — URINALYSIS, ROUTINE W REFLEX MICROSCOPIC
BILIRUBIN URINE: NEGATIVE
GLUCOSE, UA: NEGATIVE mg/dL
Hgb urine dipstick: NEGATIVE
Ketones, ur: 15 mg/dL — AB
LEUKOCYTES UA: NEGATIVE
NITRITE: NEGATIVE
PROTEIN: NEGATIVE mg/dL
SPECIFIC GRAVITY, URINE: 1.021 (ref 1.005–1.030)
UROBILINOGEN UA: 0.2 mg/dL (ref 0.0–1.0)
pH: 6.5 (ref 5.0–8.0)

## 2014-07-30 LAB — LIPASE, BLOOD: LIPASE: 17 U/L — AB (ref 22–51)

## 2014-07-30 MED ORDER — SODIUM CHLORIDE 0.9 % IV SOLN
INTRAVENOUS | Status: DC
  Administered 2014-07-30: via INTRAVENOUS

## 2014-07-30 MED ORDER — ONDANSETRON HCL 4 MG/2ML IJ SOLN
4.0000 mg | Freq: Once | INTRAMUSCULAR | Status: AC
  Administered 2014-07-30: 4 mg via INTRAVENOUS
  Filled 2014-07-30: qty 2

## 2014-07-30 MED ORDER — IOHEXOL 300 MG/ML  SOLN
25.0000 mL | Freq: Once | INTRAMUSCULAR | Status: AC | PRN
  Administered 2014-07-30: 25 mL via ORAL

## 2014-07-30 NOTE — ED Notes (Signed)
C/o constipation x 3 days.  Denies pain but reports lower abd pain yesterday.  Took Miralax without relief.

## 2014-07-30 NOTE — ED Provider Notes (Signed)
CSN: [id number]     Arrival date & time 07/30/14  2000 History   First MD Initiated Contact with Patient 07/30/14 2236     Chief Complaint  Patient presents with  . Constipation    Patient is a 69 y.o. female presenting with abdominal pain. The history is provided by the patient.  Abdominal Pain Pain location:  LLQ and RLQ Pain quality: aching and cramping   Pain radiates to:  Does not radiate Pain severity:  Moderate Onset quality:  Gradual Duration:  1 day Timing:  Constant Relieved by:  Nothing Ineffective treatments: laxatives. Associated symptoms: constipation   Associated symptoms: no diarrhea, no fever, no nausea and no vomiting   The pain is better than it was earlier.  She thought she might be constipated because she did not have a bowel movement for a few days.  She tried laxatives and enemas without relief.  Several years ago when she had these symptoms she had diverticulitis.  She has also had a partial bowel obstruction.  Past Medical History  Diagnosis Date  . Hypertension   . Diverticulitis   . Partial small bowel obstruction   . Lung nodules    Past Surgical History  Procedure Laterality Date  . Right rotator cuff surgery     No family history on file. History  Substance Use Topics  . Smoking status: Never Smoker   . Smokeless tobacco: Never Used  . Alcohol Use: 2.5 oz/week    5 Standard drinks or equivalent per week     Comment: wine   OB History    No data available     Review of Systems  Constitutional: Negative for fever.  Gastrointestinal: Positive for abdominal pain and constipation. Negative for nausea, vomiting and diarrhea.      Allergies  Review of patient's allergies indicates no known allergies.  Home Medications   Prior to Admission medications   Medication Sig Start Date End Date Taking? Authorizing Provider  albuterol (PROVENTIL HFA;VENTOLIN HFA) 108 (90 BASE) MCG/ACT inhaler Inhale 2 puffs, every 6 hours if needed- rescue  01/30/14   Deneise Lever, MD  aspirin 81 MG tablet Take 81 mg by mouth daily.    Historical Provider, MD  celecoxib (CELEBREX) 200 MG capsule Take 200 mg by mouth as needed. Pt states she is taking approx 1 per month    Historical Provider, MD  hydrochlorothiazide 25 MG tablet Take 25 mg by mouth daily.      Historical Provider, MD  losartan (COZAAR) 25 MG tablet Take 1 tablet by mouth daily. 04/04/12   Historical Provider, MD  temazepam (RESTORIL) 15 MG capsule 1-2 at bedtime for sleep if needed 01/30/14   Deneise Lever, MD   BP 142/69 mmHg  Pulse 85  Temp(Src) 98.6 F (37 C) (Oral)  Resp 12  Ht 5\' 5"  (1.651 m)  Wt 173 lb (78.472 kg)  BMI 28.79 kg/m2  SpO2 95% Physical Exam  Constitutional: She appears well-developed and well-nourished. No distress.  HENT:  Head: Normocephalic and atraumatic.  Right Ear: External ear normal.  Left Ear: External ear normal.  Eyes: Conjunctivae are normal. Right eye exhibits no discharge. Left eye exhibits no discharge. No scleral icterus.  Neck: Neck supple. No tracheal deviation present.  Cardiovascular: Normal rate, regular rhythm and intact distal pulses.   Pulmonary/Chest: Effort normal and breath sounds normal. No stridor. No respiratory distress. She has no wheezes. She has no rales.  Abdominal: Soft. Bowel sounds are normal. She  exhibits no distension. There is tenderness in the left lower quadrant. There is no rebound and no guarding. No hernia.  Musculoskeletal: She exhibits no edema or tenderness.  Neurological: She is alert. She has normal strength. No cranial nerve deficit (no facial droop, extraocular movements intact, no slurred speech) or sensory deficit. She exhibits normal muscle tone. She displays no seizure activity. Coordination normal.  Skin: Skin is warm and dry. No rash noted.  Psychiatric: She has a normal mood and affect.  Nursing note and vitals reviewed.   ED Course  Procedures (including critical care time) Labs  Review Labs Reviewed  LIPASE, BLOOD - Abnormal; Notable for the following:    Lipase 17 (*)    All other components within normal limits  COMPREHENSIVE METABOLIC PANEL - Abnormal; Notable for the following:    Sodium 132 (*)    Potassium 3.2 (*)    Chloride 96 (*)    Glucose, Bld 114 (*)    All other components within normal limits  CBC - Abnormal; Notable for the following:    WBC 12.1 (*)    All other components within normal limits  URINALYSIS, ROUTINE W REFLEX MICROSCOPIC (NOT AT [facility]) - Abnormal; Notable for the following:    Ketones, ur 15 (*)    All other components within normal limits     MDM    Pt has history of diverticulitis.  She describes lower abdominal pain and a sensation of constipation.  She has tried multiple treatments without relief.  With her elevated WBC count will ct to assess for diverticulitis, obstruction.  If negative will treat symptomatically.    Dorie Rank, MD 07/31/14 [phone]

## 2014-07-31 ENCOUNTER — Emergency Department (HOSPITAL_COMMUNITY): Payer: Medicare HMO

## 2014-07-31 ENCOUNTER — Encounter (HOSPITAL_COMMUNITY): Payer: Self-pay

## 2014-07-31 MED ORDER — HYDROCODONE-ACETAMINOPHEN 5-325 MG PO TABS: 1.0000 | ORAL_TABLET | ORAL | Status: DC | PRN

## 2014-07-31 MED ORDER — METRONIDAZOLE 500 MG PO TABS: 500.0000 mg | ORAL_TABLET | Freq: Two times a day (BID) | ORAL | Status: DC

## 2014-07-31 MED ORDER — CIPROFLOXACIN HCL 500 MG PO TABS: 500.0000 mg | ORAL_TABLET | Freq: Two times a day (BID) | ORAL | Status: DC

## 2014-07-31 MED ORDER — POLYETHYLENE GLYCOL 3350 17 G PO PACK: 17.0000 g | PACK | Freq: Every day | ORAL | Status: DC

## 2014-07-31 MED ORDER — IOHEXOL 300 MG/ML  SOLN
100.0000 mL | Freq: Once | INTRAMUSCULAR | Status: AC | PRN
  Administered 2014-07-31: 100 mL via INTRAVENOUS

## 2014-07-31 MED ORDER — CIPROFLOXACIN HCL 500 MG PO TABS
500.0000 mg | ORAL_TABLET | Freq: Once | ORAL | Status: AC
  Administered 2014-07-31: 500 mg via ORAL
  Filled 2014-07-31: qty 1

## 2014-07-31 MED ORDER — METRONIDAZOLE 500 MG PO TABS
500.0000 mg | ORAL_TABLET | Freq: Once | ORAL | Status: AC
  Administered 2014-07-31: 500 mg via ORAL
  Filled 2014-07-31: qty 1

## 2014-07-31 MED ORDER — PROMETHAZINE HCL 25 MG PO TABS: 12.5000 mg | ORAL_TABLET | Freq: Three times a day (TID) | ORAL | Status: DC | PRN

## 2014-07-31 NOTE — ED Notes (Signed)
Patient returned from CT

## 2014-07-31 NOTE — Discharge Instructions (Signed)
Take medication as prescribed.  Stick to clear liquid diet for 24 hours, then slowly advance to bland diet until feeling better.  Follow up with your doctor for recheck in 2 days.  Return to the ER for fevers, vomiting, worsening abdominal pain, or other new concerning symptoms.   Diverticulitis Diverticulitis is inflammation or infection of small pouches in your colon that form when you have a condition called diverticulosis. The pouches in your colon are called diverticula. Your colon, or large intestine, is where water is absorbed and stool is formed. Complications of diverticulitis can include:  Bleeding.  Severe infection.  Severe pain.  Perforation of your colon.  Obstruction of your colon. CAUSES  Diverticulitis is caused by bacteria. Diverticulitis happens when stool becomes trapped in diverticula. This allows bacteria to grow in the diverticula, which can lead to inflammation and infection. RISK FACTORS People with diverticulosis are at risk for diverticulitis. Eating a diet that does not include enough fiber from fruits and vegetables may make diverticulitis more likely to develop. SYMPTOMS  Symptoms of diverticulitis may include:  Abdominal pain and tenderness. The pain is normally located on the left side of the abdomen, but may occur in other areas.  Fever and chills.  Bloating.  Cramping.  Nausea.  Vomiting.  Constipation.  Diarrhea.  Blood in your stool. DIAGNOSIS  Your health care provider will ask you about your medical history and do a physical exam. You may need to have tests done because many medical conditions can cause the same symptoms as diverticulitis. Tests may include:  Blood tests.  Urine tests.  Imaging tests of the abdomen, including X-rays and CT scans. When your condition is under control, your health care provider may recommend that you have a colonoscopy. A colonoscopy can show how severe your diverticula are and whether something  else is causing your symptoms. TREATMENT  Most cases of diverticulitis are mild and can be treated at home. Treatment may include:  Taking over-the-counter pain medicines.  Following a clear liquid diet.  Taking antibiotic medicines by mouth for 7-10 days. More severe cases may be treated at a hospital. Treatment may include:  Not eating or drinking.  Taking prescription pain medicine.  Receiving antibiotic medicines through an IV tube.  Receiving fluids and nutrition through an IV tube.  Surgery. HOME CARE INSTRUCTIONS   Follow your health care provider's instructions carefully.  Follow a full liquid diet or other diet as directed by your health care provider. After your symptoms improve, your health care provider may tell you to change your diet. He or she may recommend you eat a high-fiber diet. Fruits and vegetables are good sources of fiber. Fiber makes it easier to pass stool.  Take fiber supplements or probiotics as directed by your health care provider.  Only take medicines as directed by your health care provider.  Keep all your follow-up appointments. SEEK MEDICAL CARE IF:   Your pain does not improve.  You have a hard time eating food.  Your bowel movements do not return to normal. SEEK IMMEDIATE MEDICAL CARE IF:   Your pain becomes worse.  Your symptoms do not get better.  Your symptoms suddenly get worse.  You have a fever.  You have repeated vomiting.  You have bloody or black, tarry stools. MAKE SURE YOU:   Understand these instructions.  Will watch your condition.  Will get help right away if you are not doing well or get worse. Document Released: 09/29/2004 Document Revised: 12/25/2012 Document  Reviewed: 11/14/2012 Exit[employer] Patient Information 2015 [place], [place]. This information is not intended to replace advice given to you by your health care provider. Make sure you discuss any questions you have with your health care provider.

## 2014-07-31 NOTE — ED Provider Notes (Signed)
Care assumed from Dr Tomi Bamberger.  Pt with LLQ pain, h/o diverticulitis, and constipation for 2-3 days despite miralax, enemas.  Awaiting CT scan.  Results for orders placed or performed during the hospital encounter of 07/30/14  Lipase, blood  Result Value Ref Range   Lipase 17 (L) 22 - 51 U/L  Comprehensive metabolic panel  Result Value Ref Range   Sodium 132 (L) 135 - 145 mmol/L   Potassium 3.2 (L) 3.5 - 5.1 mmol/L   Chloride 96 (L) 101 - 111 mmol/L   CO2 24 22 - 32 mmol/L   Glucose, Bld 114 (H) 65 - 99 mg/dL   BUN 11 6 - 20 mg/dL   Creatinine, Ser 0.67 0.44 - 1.00 mg/dL   Calcium 9.2 8.9 - 10.3 mg/dL   Total Protein 7.5 6.5 - 8.1 g/dL   Albumin 4.0 3.5 - 5.0 g/dL   AST 23 15 - 41 U/L   ALT 25 14 - 54 U/L   Alkaline Phosphatase 67 38 - 126 U/L   Total Bilirubin 1.1 0.3 - 1.2 mg/dL   GFR calc non Af Amer >60 >60 mL/min   GFR calc Af Amer >60 >60 mL/min   Anion gap 12 5 - 15  CBC  Result Value Ref Range   WBC 12.1 (H) 4.0 - 10.5 K/uL   RBC 4.58 3.87 - 5.11 MIL/uL   Hemoglobin 14.2 12.0 - 15.0 g/dL   HCT 41.1 36.0 - 46.0 %   MCV 89.7 78.0 - 100.0 fL   MCH 31.0 26.0 - 34.0 pg   MCHC 34.5 30.0 - 36.0 g/dL   RDW 12.9 11.5 - 15.5 %   Platelets 278 150 - 400 K/uL  Urinalysis, Routine w reflex microscopic (not at [facility])  Result Value Ref Range   Color, Urine YELLOW YELLOW   APPearance CLEAR CLEAR   Specific Gravity, Urine 1.021 1.005 - 1.030   pH 6.5 5.0 - 8.0   Glucose, UA NEGATIVE NEGATIVE mg/dL   Hgb urine dipstick NEGATIVE NEGATIVE   Bilirubin Urine NEGATIVE NEGATIVE   Ketones, ur 15 (A) NEGATIVE mg/dL   Protein, ur NEGATIVE NEGATIVE mg/dL   Urobilinogen, UA 0.2 0.0 - 1.0 mg/dL   Nitrite NEGATIVE NEGATIVE   Leukocytes, UA NEGATIVE NEGATIVE   Ct Abdomen Pelvis W Contrast  07/31/2014   CLINICAL DATA:  Bilateral lower quadrant pain for 1 day.  Nausea.  EXAM: CT ABDOMEN AND PELVIS WITH CONTRAST  TECHNIQUE: Multidetector CT imaging of the abdomen and pelvis was performed using  the standard protocol following bolus administration of intravenous contrast.  CONTRAST:  155mL OMNIPAQUE IOHEXOL 300 MG/ML  SOLN  COMPARISON:  None.  FINDINGS: There are acute inflammatory changes surrounding a diverticulum of the distal sigmoid colon. This probably represents acute diverticulitis. There is no abscess. There is no extraluminal air. There is no ascites.  The appendix is normal. No other acute findings are evident in the abdomen or pelvis.  There is a small hiatal hernia. Stomach and small bowel are otherwise unremarkable.  There is a 9 mm focal hypodensity in the central portion of the right hepatic lobe, probably benign but too small for conclusive characterization. There are otherwise normal appearances of the liver, spleen, pancreas, adrenals and kidneys.  The abdominal aorta is normal in caliber. There is mild atherosclerotic calcification. There is no adenopathy in the abdomen or pelvis.  There is a 6 mm noncalcified nodule in the right lower lobe, unchanged from 06/28/2009. There is a pleural-based 6.5 mm  nodule more posteriorly in the right lower lobe, not visible on the prior study. This is indeterminate. There is an unchanged 6 mm nodule in the lateral aspect of the left lower lobe and and unchanged 3 mm nodule in the lateral periphery slightly more caudal.  No significant skeletal lesion is evident.  IMPRESSION: 1. Acute distal sigmoid diverticulitis.  No abscess. 2. Small hiatal hernia 3. There are several unchanged benign nodules in the lung bases. There is a new pleural-based 6.5 mm nodule in the right lower lobe posterolateral periphery which is indeterminate. If the patient is at high risk for bronchogenic carcinoma, follow-up chest CT at 3-66months is recommended. If the patient is at low risk for bronchogenic carcinoma, follow-up chest CT at 6-12 months is recommended. This recommendation follows the consensus statement: Guidelines for Management of Small Pulmonary Nodules Detected  on CT Scans: A Statement from the [employer] as published in Radiology 2005; 237:395-400.   Electronically Signed   By: Andreas Newport M.D.   On: 07/31/2014 01:42    Pt's results reviewed.  She reports she is feeling much better, but would like to have a BM.  No fever, no vomiting, no abscess on CT scan.  She reports she is comfortable going home with antibiotics, understands reasons for return, and has pcm she will follow up with.  Linton Flemings, MD 07/31/14 [phone]

## 2014-11-06 DIAGNOSIS — Z130 Encounter for screening for diseases of the blood and blood-forming organs and certain disorders involving the immune mechanism: Secondary | ICD-10-CM | POA: Diagnosis not present

## 2014-11-06 DIAGNOSIS — Z01419 Encounter for gynecological examination (general) (routine) without abnormal findings: Secondary | ICD-10-CM | POA: Diagnosis not present

## 2014-11-06 DIAGNOSIS — Z124 Encounter for screening for malignant neoplasm of cervix: Secondary | ICD-10-CM | POA: Diagnosis not present

## 2014-11-06 DIAGNOSIS — Z1389 Encounter for screening for other disorder: Secondary | ICD-10-CM | POA: Diagnosis not present

## 2014-12-10 DIAGNOSIS — Z23 Encounter for immunization: Secondary | ICD-10-CM | POA: Diagnosis not present

## 2014-12-10 DIAGNOSIS — E559 Vitamin D deficiency, unspecified: Secondary | ICD-10-CM | POA: Diagnosis not present

## 2014-12-10 DIAGNOSIS — I10 Essential (primary) hypertension: Secondary | ICD-10-CM | POA: Diagnosis not present

## 2014-12-10 DIAGNOSIS — M1990 Unspecified osteoarthritis, unspecified site: Secondary | ICD-10-CM | POA: Diagnosis not present

## 2014-12-10 DIAGNOSIS — G4733 Obstructive sleep apnea (adult) (pediatric): Secondary | ICD-10-CM | POA: Diagnosis not present

## 2014-12-10 DIAGNOSIS — R918 Other nonspecific abnormal finding of lung field: Secondary | ICD-10-CM | POA: Diagnosis not present

## 2014-12-10 DIAGNOSIS — Z0000 Encounter for general adult medical examination without abnormal findings: Secondary | ICD-10-CM | POA: Diagnosis not present

## 2014-12-10 DIAGNOSIS — R768 Other specified abnormal immunological findings in serum: Secondary | ICD-10-CM | POA: Diagnosis not present

## 2014-12-16 DIAGNOSIS — R69 Illness, unspecified: Secondary | ICD-10-CM | POA: Diagnosis not present

## 2014-12-24 DIAGNOSIS — R768 Other specified abnormal immunological findings in serum: Secondary | ICD-10-CM | POA: Diagnosis not present

## 2015-01-20 DIAGNOSIS — R69 Illness, unspecified: Secondary | ICD-10-CM | POA: Diagnosis not present

## 2015-01-29 DIAGNOSIS — R300 Dysuria: Secondary | ICD-10-CM | POA: Diagnosis not present

## 2015-01-29 DIAGNOSIS — R1033 Periumbilical pain: Secondary | ICD-10-CM | POA: Diagnosis not present

## 2015-01-29 DIAGNOSIS — R509 Fever, unspecified: Secondary | ICD-10-CM | POA: Diagnosis not present

## 2015-02-01 DIAGNOSIS — R109 Unspecified abdominal pain: Secondary | ICD-10-CM | POA: Diagnosis not present

## 2015-02-02 ENCOUNTER — Ambulatory Visit: Payer: Medicare HMO | Admitting: Internal Medicine

## 2015-02-03 DIAGNOSIS — E785 Hyperlipidemia, unspecified: Secondary | ICD-10-CM | POA: Diagnosis not present

## 2015-02-03 DIAGNOSIS — R768 Other specified abnormal immunological findings in serum: Secondary | ICD-10-CM | POA: Diagnosis not present

## 2015-02-23 DIAGNOSIS — R69 Illness, unspecified: Secondary | ICD-10-CM | POA: Diagnosis not present

## 2015-02-25 DIAGNOSIS — Z1211 Encounter for screening for malignant neoplasm of colon: Secondary | ICD-10-CM | POA: Diagnosis not present

## 2015-04-30 ENCOUNTER — Encounter (HOSPITAL_COMMUNITY): Payer: Self-pay | Admitting: Emergency Medicine

## 2015-04-30 ENCOUNTER — Emergency Department (HOSPITAL_COMMUNITY)
Admission: EM | Admit: 2015-04-30 | Discharge: 2015-04-30 | Disposition: A | Discharge status: Home / Self Care | Payer: Medicare HMO | Attending: Emergency Medicine | Admitting: Emergency Medicine

## 2015-04-30 DIAGNOSIS — Z79899 Other long term (current) drug therapy: Secondary | ICD-10-CM | POA: Insufficient documentation

## 2015-04-30 DIAGNOSIS — Z8719 Personal history of other diseases of the digestive system: Secondary | ICD-10-CM | POA: Diagnosis not present

## 2015-04-30 DIAGNOSIS — I10 Essential (primary) hypertension: Secondary | ICD-10-CM | POA: Insufficient documentation

## 2015-04-30 DIAGNOSIS — Z7982 Long term (current) use of aspirin: Secondary | ICD-10-CM | POA: Insufficient documentation

## 2015-04-30 DIAGNOSIS — M25512 Pain in left shoulder: Secondary | ICD-10-CM

## 2015-04-30 LAB — CBC WITH DIFFERENTIAL/PLATELET
BASOS ABS: 0.1 10*3/uL (ref 0.0–0.1)
BASOS PCT: 1 %
EOS ABS: 0.2 10*3/uL (ref 0.0–0.7)
Eosinophils Relative: 2 %
HEMATOCRIT: 40.1 % (ref 36.0–46.0)
Hemoglobin: 13.2 g/dL (ref 12.0–15.0)
Lymphocytes Relative: 51 %
Lymphs Abs: 3.6 10*3/uL (ref 0.7–4.0)
MCH: 29.7 pg (ref 26.0–34.0)
MCHC: 32.9 g/dL (ref 30.0–36.0)
MCV: 90.1 fL (ref 78.0–100.0)
Monocytes Absolute: 0.3 10*3/uL (ref 0.1–1.0)
Monocytes Relative: 4 %
NEUTROS PCT: 42 %
Neutro Abs: 2.9 10*3/uL (ref 1.7–7.7)
PLATELETS: 286 10*3/uL (ref 150–400)
RBC: 4.45 MIL/uL (ref 3.87–5.11)
RDW: 13.3 % (ref 11.5–15.5)
WBC: 7 10*3/uL (ref 4.0–10.5)

## 2015-04-30 LAB — BASIC METABOLIC PANEL
ANION GAP: 12 (ref 5–15)
BUN: 23 mg/dL — AB (ref 6–20)
CO2: 23 mmol/L (ref 22–32)
Calcium: 9.1 mg/dL (ref 8.9–10.3)
Chloride: 101 mmol/L (ref 101–111)
Creatinine, Ser: 0.77 mg/dL (ref 0.44–1.00)
GFR calc Af Amer: >60 mL/min (ref >60)
GFR calc non Af Amer: >60 mL/min (ref >60)
GLUCOSE: 105 mg/dL — AB (ref 65–99)
Potassium: 3.4 mmol/L — ABNORMAL LOW (ref 3.5–5.1)
Sodium: 136 mmol/L (ref 135–145)

## 2015-04-30 LAB — I-STAT TROPONIN, ED
TROPONIN I, POC: 0 ng/mL (ref 0.00–0.08)
Troponin i, poc: 0 ng/mL (ref 0.00–0.08)

## 2015-04-30 MED ORDER — NAPROXEN 375 MG PO TABS: 375.0000 mg | ORAL_TABLET | Freq: Two times a day (BID) | ORAL | Status: DC

## 2015-04-30 NOTE — ED Notes (Signed)
Pt. woke up this morning with left arm pain , denies injury , no SOB or nausea.

## 2015-04-30 NOTE — ED Provider Notes (Signed)
CSN: [id number]     Arrival date & time 04/30/15  0245 History   By signing my name below, I, Forrestine Him, attest that this documentation has been prepared under the direction and in the presence of Orpah Greek, MD.  Electronically Signed: Forrestine Him, ED Scribe. 04/30/2015. 3:50 AM.   Chief Complaint  Patient presents with  . Arm Pain   The history is provided by the patient. No language interpreter was used.    HPI Comments: Lynn Estes is a 70 y.o. female with a PMHx of HTN who presents to the Emergency Department complaining of constant. Ongoing R arm pain onset 1:00 AM this evening that woke her from sleep. Pain is described as achy and sharp. Currently she is pain free but states discomfort persisted for approximetely 40 minutes. No recent injury, trauma, or falls. Pt also reports L shoulder pain which has been ongoing for several weeks. No aggravating or alleviating factors at this time. No recent fever, chills, nausea, vomiting, chest pain, or shortness of breath. No known allergies to medications.  PCP: Garlan Fair, MD    Past Medical History  Diagnosis Date  . Hypertension   . Diverticulitis   . Partial small bowel obstruction ([place])   . Lung nodules    Past Surgical History  Procedure Laterality Date  . Right rotator cuff surgery     No family history on file. Social History  Substance Use Topics  . Smoking status: Never Smoker   . Smokeless tobacco: Never Used  . Alcohol Use: Yes   OB History    No data available     Review of Systems  Constitutional: Negative for fever and chills.  Respiratory: Negative for shortness of breath.   Cardiovascular: Negative for chest pain.  Gastrointestinal: Negative for nausea, vomiting and abdominal pain.  Musculoskeletal: Positive for arthralgias.  Neurological: Negative for headaches.  Psychiatric/Behavioral: Negative for confusion.  All other systems reviewed and are negative.     Allergies   Review of patient's allergies indicates no known allergies.  Home Medications   Prior to Admission medications   Medication Sig Start Date End Date Taking? Authorizing Provider  albuterol (PROVENTIL HFA;VENTOLIN HFA) 108 (90 BASE) MCG/ACT inhaler Inhale 2 puffs, every 6 hours if needed- rescue 01/30/14  Yes Deneise Lever, MD  aspirin 81 MG tablet Take 81 mg by mouth daily as needed for pain.    Yes Historical Provider, MD  hydrochlorothiazide 25 MG tablet Take 25 mg by mouth daily.     Yes Historical Provider, MD  losartan (COZAAR) 25 MG tablet Take 1 tablet by mouth daily. 04/04/12  Yes Historical Provider, MD  promethazine (PHENERGAN) 25 MG tablet Take 0.5 tablets (12.5 mg total) by mouth every 8 (eight) hours as needed for nausea. 07/31/14  Yes Linton Flemings, MD  naproxen (NAPROSYN) 375 MG tablet Take 1 tablet (375 mg total) by mouth 2 (two) times daily. 04/30/15   Orpah Greek, MD   Triage Vitals: BP 132/69 mmHg  Pulse 67  Temp(Src) 97.6 F (36.4 C) (Oral)  Resp 15  Ht 5\' 5"  (1.651 m)  Wt 175 lb 2 oz (79.436 kg)  BMI 29.14 kg/m2  SpO2 94%   Physical Exam  Constitutional: She is oriented to person, place, and time. She appears well-developed and well-nourished. No distress.  HENT:  Head: Normocephalic and atraumatic.  Right Ear: Hearing normal.  Left Ear: Hearing normal.  Nose: Nose normal.  Mouth/Throat: Oropharynx is clear and moist and  mucous membranes are normal.  Eyes: Conjunctivae and EOM are normal. Pupils are equal, round, and reactive to light.  Neck: Normal range of motion. Neck supple.  Cardiovascular: Regular rhythm, S1 normal and S2 normal.  Exam reveals no gallop and no friction rub.   No murmur heard. Pulmonary/Chest: Effort normal and breath sounds normal. No respiratory distress. She exhibits no tenderness.  Abdominal: Soft. Normal appearance and bowel sounds are normal. There is no hepatosplenomegaly. There is no tenderness. There is no rebound, no  guarding, no tenderness at McBurney's point and negative Murphy's sign. No hernia.  Musculoskeletal: Normal range of motion.  Neurological: She is alert and oriented to person, place, and time. She has normal strength. No cranial nerve deficit or sensory deficit. Coordination normal. GCS eye subscore is 4. GCS verbal subscore is 5. GCS motor subscore is 6.  Skin: Skin is warm, dry and intact. No rash noted. No cyanosis.  Psychiatric: She has a normal mood and affect. Her speech is normal and behavior is normal. Thought content normal.  Nursing note and vitals reviewed.   ED Course  Procedures (including critical care time)  DIAGNOSTIC STUDIES: Oxygen Saturation is 95% on RA, adequate by my interpretation.    COORDINATION OF CARE: 3:46 AM- Will order blood work. Discussed treatment plan with pt at bedside and pt agreed to plan.     Labs Review Labs Reviewed  BASIC METABOLIC PANEL - Abnormal; Notable for the following:    Potassium 3.4 (*)    Glucose, Bld 105 (*)    BUN 23 (*)    All other components within normal limits  CBC WITH DIFFERENTIAL/PLATELET  I-STAT TROPOININ, ED  I-STAT TROPOININ, ED    Imaging Review No results found. I have personally reviewed and evaluated these images and lab results as part of my medical decision-making.   EKG Interpretation   Date/Time:  Thursday April 30 2015 02:49:56 EDT Ventricular Rate:  77 PR Interval:  178 QRS Duration: 80 QT Interval:  392 QTC Calculation: 443 R Axis:   -50 Text Interpretation:  Normal sinus rhythm Possible Left atrial enlargement  Left axis deviation Abnormal ECG No significant change since last tracing  Confirmed by Nicolas Banh  MD, Caden Fatica [id number]) on 04/30/2015 3:47:36 AM      MDM   Final diagnoses:  Shoulder pain, acute, left    Patient presents to the ER for evaluation of pain in her left arm. Patient reports that she has been experiencing frequent left shoulder pain, but this morning she noticed pain  radiating down her arm into her forearm area. Pain lasted for approximately an hour and then resolved. She denies any direct injury. At time of my evaluation, pain has resolved spontaneously and has not returned. She has had a thorough cardiac workup that has been negative. This included 2 troponins. She has not had any chest pain. Vital signs are normal. Patient reassured, symptoms likely musculoskeletal secondary to inflammation in the shoulder causing nerve impingement. Will have patient follow-up with primary doctor.  I personally performed the services described in this documentation, which was scribed in my presence. The recorded information has been reviewed and is accurate.    Orpah Greek, MD 04/30/15 [phone]

## 2015-04-30 NOTE — Discharge Instructions (Signed)

## 2015-05-05 DIAGNOSIS — H5213 Myopia, bilateral: Secondary | ICD-10-CM | POA: Diagnosis not present

## 2015-05-05 DIAGNOSIS — H2513 Age-related nuclear cataract, bilateral: Secondary | ICD-10-CM | POA: Diagnosis not present

## 2015-05-05 DIAGNOSIS — H538 Other visual disturbances: Secondary | ICD-10-CM | POA: Diagnosis not present

## 2015-05-14 ENCOUNTER — Other Ambulatory Visit (HOSPITAL_COMMUNITY): Payer: Medicare HMO

## 2015-05-14 DIAGNOSIS — H538 Other visual disturbances: Secondary | ICD-10-CM | POA: Diagnosis not present

## 2015-05-14 DIAGNOSIS — H5213 Myopia, bilateral: Secondary | ICD-10-CM | POA: Diagnosis not present

## 2015-05-27 NOTE — Patient Instructions (Signed)
Lynn Estes  05/27/2015     @PREFPERIOPPHARMACY @   Your procedure is scheduled on 06/08/2015.  Report to [facility] at 7:50 A.M.  Call this number if you have problems the morning of surgery:  [phone]   Remember:  Do not eat food or drink liquids after midnight.  Take these medicines the morning of surgery with A SIP OF WATER Albuterol inhaler (bring with you to hospital, as well), Cozaar, Phenergan if needed   Do not wear jewelry, make-up or nail polish.  Do not wear lotions, powders, or perfumes.  You may wear deodorant.  Do not shave 48 hours prior to surgery.  Men may shave face and neck.  Do not bring valuables to the hospital.  [facility] is not responsible for any belongings or valuables.  Contacts, dentures or bridgework may not be worn into surgery.  Leave your suitcase in the car.  After surgery it may be brought to your room.  For patients admitted to the hospital, discharge time will be determined by your treatment team.  Patients discharged the day of surgery will not be allowed to drive home.    Please read over the following fact sheets that you were given. Anesthesia Post-op Instructions     PATIENT INSTRUCTIONS POST-ANESTHESIA  IMMEDIATELY FOLLOWING SURGERY:  Do not drive or operate machinery for the first twenty four hours after surgery.  Do not make any important decisions for twenty four hours after surgery or while taking narcotic pain medications or sedatives.  If you develop intractable nausea and vomiting or a severe headache please notify your doctor immediately.  FOLLOW-UP:  Please make an appointment with your surgeon as instructed. You do not need to follow up with anesthesia unless specifically instructed to do so.  WOUND CARE INSTRUCTIONS (if applicable):  Keep a dry clean dressing on the anesthesia/puncture wound site if there is drainage.  Once the wound has quit draining you may leave it open to air.  Generally you should leave  the bandage intact for twenty four hours unless there is drainage.  If the epidural site drains for more than 36-48 hours please call the anesthesia department.  QUESTIONS?:  Please feel free to call your physician or the hospital operator if you have any questions, and they will be happy to assist you.       A cataract is a clouding of the lens of the eye. When a lens becomes cloudy, vision is reduced based on the degree and nature of the clouding. Surgery may be needed to improve vision. Surgery removes the cloudy lens and usually replaces it with a substitute lens (intraocular lens, IOL). LET YOUR EYE DOCTOR KNOW ABOUT:  Allergies to food or medicine.  Medicines taken including herbs, eye drops, over-the-counter medicines, and creams.  Use of steroids (by mouth or creams).  Previous problems with anesthetics or numbing medicine.  History of bleeding problems or blood clots.  Previous surgery.  Other health problems, including diabetes and kidney problems.  Possibility of pregnancy, if this applies. RISKS AND COMPLICATIONS  Infection.  Inflammation of the eyeball (endophthalmitis) that can spread to both eyes (sympathetic ophthalmia).  Poor wound healing.  If an IOL is inserted, it can later fall out of proper position. This is very uncommon.  Clouding of the part of your eye that holds an IOL in place. This is called an "after-cataract." These are uncommon but easily treated. BEFORE THE PROCEDURE  Do not eat or drink anything except small  amounts of water for 8 to 12 before your surgery, or as directed by your caregiver.  Unless you are told otherwise, continue any eye drops you have been prescribed.  Talk to your primary caregiver about all other medicines that you take (both prescription and nonprescription). In some cases, you may need to stop or change medicines near the time of your surgery. This is most important if you are taking blood-thinning medicine.Do not stop  medicines unless you are told to do so.  Arrange for someone to drive you to and from the procedure.  Do not put contact lenses in either eye on the day of your surgery. PROCEDURE There is more than one method for safely removing a cataract. Your doctor can explain the differences and help determine which is best for you. Phacoemulsification surgery is the most common form of cataract surgery.  An injection is given behind the eye or eye drops are given to make this a painless procedure.  A small cut (incision) is made on the edge of the clear, dome-shaped surface that covers the front of the eye (cornea).  A tiny probe is painlessly inserted into the eye. This device gives off ultrasound waves that soften and break up the cloudy center of the lens. This makes it easier for the cloudy lens to be removed by suction.  An IOL may be implanted.  The normal lens of the eye is covered by a clear capsule. Part of that capsule is intentionally left in the eye to support the IOL.  Your surgeon may or may not use stitches to close the incision. There are other forms of cataract surgery that require a larger incision and stitches to close the eye. This approach is taken in cases where the doctor feels that the cataract cannot be easily removed using phacoemulsification. AFTER THE PROCEDURE  When an IOL is implanted, it does not need care. It becomes a permanent part of your eye and cannot be seen or felt.  Your doctor will schedule follow-up exams to check on your progress.  Review your other medicines with your doctor to see which can be resumed after surgery.  Use eye drops or take medicine as prescribed by your doctor.   This information is not intended to replace advice given to you by your health care provider. Make sure you discuss any questions you have with your health care provider.   Document Released: 12/09/2010 Document Revised: 01/10/2014 Document Reviewed: 12/09/2010 Elsevier  Interactive Patient Education [employer].

## 2015-06-02 ENCOUNTER — Encounter (HOSPITAL_COMMUNITY)
Admission: RE | Admit: 2015-06-02 | Discharge: 2015-06-02 | Disposition: A | Discharge status: Home / Self Care | Payer: Medicare HMO | Source: Ambulatory Visit | Attending: Ophthalmology | Admitting: Ophthalmology

## 2015-06-02 ENCOUNTER — Encounter (HOSPITAL_COMMUNITY): Payer: Self-pay

## 2015-06-02 DIAGNOSIS — H5213 Myopia, bilateral: Secondary | ICD-10-CM | POA: Diagnosis not present

## 2015-06-02 DIAGNOSIS — H2512 Age-related nuclear cataract, left eye: Secondary | ICD-10-CM | POA: Diagnosis not present

## 2015-06-02 DIAGNOSIS — H538 Other visual disturbances: Secondary | ICD-10-CM | POA: Diagnosis not present

## 2015-06-02 DIAGNOSIS — Z01812 Encounter for preprocedural laboratory examination: Secondary | ICD-10-CM | POA: Insufficient documentation

## 2015-06-02 HISTORY — DX: Unspecified osteoarthritis, unspecified site: M19.90

## 2015-06-02 HISTORY — DX: Unspecified asthma, uncomplicated: J45.909

## 2015-06-02 LAB — BASIC METABOLIC PANEL
Anion gap: 9 (ref 5–15)
BUN: 18 mg/dL (ref 6–20)
CALCIUM: 9.3 mg/dL (ref 8.9–10.3)
CHLORIDE: 104 mmol/L (ref 101–111)
CO2: 24 mmol/L (ref 22–32)
CREATININE: 0.62 mg/dL (ref 0.44–1.00)
GFR calc non Af Amer: >60 mL/min (ref >60)
Glucose, Bld: 124 mg/dL — ABNORMAL HIGH (ref 65–99)
Potassium: 3.5 mmol/L (ref 3.5–5.1)
SODIUM: 137 mmol/L (ref 135–145)

## 2015-06-02 LAB — CBC
HEMATOCRIT: 39.5 % (ref 36.0–46.0)
Hemoglobin: 13.4 g/dL (ref 12.0–15.0)
MCH: 30.9 pg (ref 26.0–34.0)
MCHC: 33.9 g/dL (ref 30.0–36.0)
MCV: 91 fL (ref 78.0–100.0)
Platelets: 284 10*3/uL (ref 150–400)
RBC: 4.34 MIL/uL (ref 3.87–5.11)
RDW: 13 % (ref 11.5–15.5)
WBC: 6.3 10*3/uL (ref 4.0–10.5)

## 2015-06-08 ENCOUNTER — Encounter (HOSPITAL_COMMUNITY): Payer: Self-pay | Admitting: *Deleted

## 2015-06-08 ENCOUNTER — Ambulatory Visit (HOSPITAL_COMMUNITY): Payer: Medicare HMO | Admitting: Anesthesiology

## 2015-06-08 ENCOUNTER — Encounter (HOSPITAL_COMMUNITY)
Admission: RE | Disposition: A | Discharge status: Home / Self Care | Payer: Self-pay | Source: Ambulatory Visit | Attending: Ophthalmology

## 2015-06-08 ENCOUNTER — Ambulatory Visit (HOSPITAL_COMMUNITY)
Admission: RE | Admit: 2015-06-08 | Discharge: 2015-06-08 | Disposition: A | Discharge status: Home / Self Care | Payer: Medicare HMO | Source: Ambulatory Visit | Attending: Ophthalmology | Admitting: Ophthalmology

## 2015-06-08 DIAGNOSIS — Z791 Long term (current) use of non-steroidal anti-inflammatories (NSAID): Secondary | ICD-10-CM | POA: Diagnosis not present

## 2015-06-08 DIAGNOSIS — H538 Other visual disturbances: Secondary | ICD-10-CM | POA: Diagnosis not present

## 2015-06-08 DIAGNOSIS — Z79899 Other long term (current) drug therapy: Secondary | ICD-10-CM | POA: Insufficient documentation

## 2015-06-08 DIAGNOSIS — I10 Essential (primary) hypertension: Secondary | ICD-10-CM | POA: Insufficient documentation

## 2015-06-08 DIAGNOSIS — Z7982 Long term (current) use of aspirin: Secondary | ICD-10-CM | POA: Insufficient documentation

## 2015-06-08 DIAGNOSIS — H2512 Age-related nuclear cataract, left eye: Secondary | ICD-10-CM | POA: Insufficient documentation

## 2015-06-08 DIAGNOSIS — H269 Unspecified cataract: Secondary | ICD-10-CM | POA: Diagnosis not present

## 2015-06-08 DIAGNOSIS — H5213 Myopia, bilateral: Secondary | ICD-10-CM | POA: Diagnosis not present

## 2015-06-08 HISTORY — PX: CATARACT EXTRACTION W/PHACO: SHX586

## 2015-06-08 SURGERY — PHACOEMULSIFICATION, CATARACT, WITH IOL INSERTION
Anesthesia: Monitor Anesthesia Care | Laterality: Left

## 2015-06-08 MED ORDER — BSS IO SOLN
INTRAOCULAR | Status: DC | PRN
  Administered 2015-06-08: 15 mL

## 2015-06-08 MED ORDER — LACTATED RINGERS IV SOLN
INTRAVENOUS | Status: DC
  Administered 2015-06-08: 10:00:00 via INTRAVENOUS

## 2015-06-08 MED ORDER — FENTANYL CITRATE (PF) 100 MCG/2ML IJ SOLN
25.0000 ug | INTRAMUSCULAR | Status: AC
  Administered 2015-06-08 (×2): 25 ug via INTRAVENOUS

## 2015-06-08 MED ORDER — MIDAZOLAM HCL 2 MG/2ML IJ SOLN
INTRAMUSCULAR | Status: AC
  Filled 2015-06-08: qty 2

## 2015-06-08 MED ORDER — TETRACAINE HCL 0.5 % OP SOLN
1.0000 [drp] | OPHTHALMIC | Status: AC
  Administered 2015-06-08 (×3): 1 [drp] via OPHTHALMIC
  Filled 2015-06-08: qty 4

## 2015-06-08 MED ORDER — NA HYALUR & NA CHOND-NA HYALUR 0.55-0.5 ML IO KIT
PACK | INTRAOCULAR | Status: DC | PRN
  Administered 2015-06-08: 1 via OPHTHALMIC

## 2015-06-08 MED ORDER — CYCLOPENTOLATE-PHENYLEPHRINE 0.2-1 % OP SOLN
1.0000 [drp] | OPHTHALMIC | Status: AC
  Administered 2015-06-08 (×3): 1 [drp] via OPHTHALMIC

## 2015-06-08 MED ORDER — POVIDONE-IODINE 5 % OP SOLN
OPHTHALMIC | Status: DC | PRN
  Administered 2015-06-08: 1 via OPHTHALMIC

## 2015-06-08 MED ORDER — LIDOCAINE HCL 3.5 % OP GEL
1.0000 "application " | Freq: Once | OPHTHALMIC | Status: AC
  Administered 2015-06-08: 1 via OPHTHALMIC
  Filled 2015-06-08: qty 1

## 2015-06-08 MED ORDER — BSS IO SOLN
INTRAOCULAR | Status: AC
  Filled 2015-06-08: qty 4

## 2015-06-08 MED ORDER — FENTANYL CITRATE (PF) 100 MCG/2ML IJ SOLN
INTRAMUSCULAR | Status: AC
  Filled 2015-06-08: qty 2

## 2015-06-08 MED ORDER — MIDAZOLAM HCL 2 MG/2ML IJ SOLN
1.0000 mg | INTRAMUSCULAR | Status: DC | PRN
  Administered 2015-06-08: 2 mg via INTRAVENOUS

## 2015-06-08 MED ORDER — PHENYLEPHRINE-KETOROLAC 1-0.3 % IO SOLN
INTRAOCULAR | Status: DC | PRN
  Administered 2015-06-08: 500 mL via OPHTHALMIC

## 2015-06-08 MED ORDER — LIDOCAINE HCL 3.5 % OP GEL
OPHTHALMIC | Status: DC | PRN
  Administered 2015-06-08: 1 via OPHTHALMIC

## 2015-06-08 MED ORDER — TETRACAINE 0.5 % OP SOLN OPTIME - NO CHARGE
OPHTHALMIC | Status: DC | PRN
  Administered 2015-06-08: 2 [drp] via OPHTHALMIC

## 2015-06-08 SURGICAL SUPPLY — 7 items
CLOTH BEACON ORANGE TIMEOUT ST (SAFETY) ×1 IMPLANT
GLOVE BIOGEL PI IND STRL 7.0 (GLOVE) IMPLANT
GLOVE BIOGEL PI INDICATOR 7.0 (GLOVE) ×2
INST SET CATARACT ~~LOC~~ (KITS) ×2 IMPLANT
LENS ALC ACRYL/TECN (Ophthalmic Related) ×2 IMPLANT
PAD ARMBOARD 7.5X6 YLW CONV (MISCELLANEOUS) ×1 IMPLANT
WATER STERILE IRR 250ML POUR (IV SOLUTION) ×1 IMPLANT

## 2015-06-08 NOTE — H&P (Signed)
I have reviewed the pre printed H&P, the patient was re-examined, and I have identified no significant interval changes in the patient's medical condition.  There is no change in the plan of care since the history and physical of record. 

## 2015-06-08 NOTE — Anesthesia Procedure Notes (Signed)
Procedure Name: MAC Date/Time: 06/08/2015 10:43 AM Performed by: Vista Deck Pre-anesthesia Checklist: Patient identified, Emergency Drugs available, Suction available, Timeout performed and Patient being monitored Patient Re-evaluated:Patient Re-evaluated prior to inductionOxygen Delivery Method: Nasal Cannula

## 2015-06-08 NOTE — Brief Op Note (Signed)
06/08/2015  11:20 AM  PATIENT:  Courtnie Melvin-Lewis  70 y.o. female  PRE-OPERATIVE DIAGNOSIS:  nuclear cataract left eye  POST-OPERATIVE DIAGNOSIS:  nuclear cataract left eye  PROCEDURE:  Procedure(s): CATARACT EXTRACTION PHACO AND INTRAOCULAR LENS PLACEMENT LEFT EYE CDE=7.18  SURGEON:  Surgeon(s): Williams Che, MD  ASSISTANTS:  Bonney Roussel. CST  ANESTHESIA STAFF: Anesthesiologist: Lerry Liner, MD CRNA: Vista Deck, CRNA  ANESTHESIA:   topical and MAC  REQUESTED LENS POWER: 15.0  LENS IMPLANT INFORMATION:   Alcon [id number]  S/n [id number]  Exp 02/2019  CUMULATIVE DISSIPATED ENERGY:7.18  INDICATIONS:see scanned office H&P for specifics  OP FINDINGS:dense NS  COMPLICATIONS:None  DICTATION #: none  PLAN OF CARE: as above  PATIENT DISPOSITION:  Short Stay

## 2015-06-08 NOTE — Transfer of Care (Signed)
Immediate Anesthesia Transfer of Care Note  Patient: Lynn Estes  Procedure(s) Performed: Procedure(s) (LRB): CATARACT EXTRACTION PHACO AND INTRAOCULAR LENS PLACEMENT LEFT EYE CDE=7.18 (Left)  Patient Location: Shortstay  Anesthesia Type: MAC  Level of Consciousness: awake  Airway & Oxygen Therapy: Patient Spontanous Breathing   Post-op Assessment: Report given to PACU RN, Post -op Vital signs reviewed and stable and Patient moving all extremities  Post vital signs: Reviewed and stable  Complications: No apparent anesthesia complications

## 2015-06-08 NOTE — Anesthesia Preprocedure Evaluation (Signed)
Anesthesia Evaluation  Patient identified by MRN, date of birth, ID band Patient awake    Reviewed: Allergy & Precautions, NPO status , Patient's Chart, lab work & pertinent test results  Airway Mallampati: II  TM Distance: >3 FB     Dental  (+) Implants   Pulmonary asthma ,    breath sounds clear to auscultation       Cardiovascular hypertension, Pt. on medications  Rhythm:Regular Rate:Normal     Neuro/Psych    GI/Hepatic   Endo/Other    Renal/GU      Musculoskeletal   Abdominal   Peds  Hematology   Anesthesia Other Findings   Reproductive/Obstetrics                             Anesthesia Physical Anesthesia Plan  ASA: II  Anesthesia Plan: MAC   Post-op Pain Management:    Induction: Intravenous  Airway Management Planned: Nasal Cannula  Additional Equipment:   Intra-op Plan:   Post-operative Plan:   Informed Consent: I have reviewed the patients History and Physical, chart, labs and discussed the procedure including the risks, benefits and alternatives for the proposed anesthesia with the patient or authorized representative who has indicated his/her understanding and acceptance.     Plan Discussed with:   Anesthesia Plan Comments:         Anesthesia Quick Evaluation

## 2015-06-08 NOTE — Anesthesia Postprocedure Evaluation (Signed)
  Anesthesia Post-op Note  Patient: Lynn Estes  Procedure(s) Performed: Procedure(s) (LRB): CATARACT EXTRACTION PHACO AND INTRAOCULAR LENS PLACEMENT LEFT EYE CDE=7.18 (Left)  Patient Location:  Short Stay  Anesthesia Type: MAC  Level of Consciousness: awake  Airway and Oxygen Therapy: Patient Spontanous Breathing  Post-op Pain: none  Post-op Assessment: Post-op Vital signs reviewed, Patient's Cardiovascular Status Stable, Respiratory Function Stable, Patent Airway, No signs of Nausea or vomiting and Pain level controlled  Post-op Vital Signs: Reviewed and stable  Complications: No apparent anesthesia complications

## 2015-06-08 NOTE — Op Note (Signed)
06/08/2015  11:20 AM  PATIENT:  Lynn Estes  70 y.o. female  PRE-OPERATIVE DIAGNOSIS:  nuclear cataract left eye  POST-OPERATIVE DIAGNOSIS:  nuclear cataract left eye  PROCEDURE:  Procedure(s): CATARACT EXTRACTION PHACO AND INTRAOCULAR LENS PLACEMENT LEFT EYE CDE=7.18  SURGEON:  Surgeon(s): Williams Che, MD  ASSISTANTS:  Bonney Roussel. CST  ANESTHESIA STAFF: Anesthesiologist: Lerry Liner, MD CRNA: Vista Deck, CRNA  ANESTHESIA:   topical and MAC  REQUESTED LENS POWER: 15.0  LENS IMPLANT INFORMATION:   Alcon SN60WF  S/n [id number]  Exp 02/2019  CUMULATIVE DISSIPATED ENERGY:7.18  INDICATIONS:see scanned office H&P for specifics  OP FINDINGS:dense NS  COMPLICATIONS:None  PROCEDURE:  The patient was brought to the operating room in good condition.  The operative eye was prepped and draped in the usual fashion for intraocular surgery.  Lidocaine gel was dropped onto the eye.  A 2.4 mm 10 O'clock near clear corneal stepped incision and a 12 O'clock stab incision were created.  Viscoat was instilled into the anterior chamber.  The 5 mm anterior capsulorhexis was performed with a bent needle cystotome and Utrata forceps.  The lens was hydrodissected and hydrodelineated with a cannula and balanced salt solution and rotated with a Kuglen hook.  Phacoemulsification was perfomed in the divide and conquer technique.  The remaining cortex was removed with I&A and the capsular surfaces polished as necessary.  Provisc was placed into the capsular bag and the lens inserted with the Alcon inserter.  The viscoelastic was removed with I&A and the lens "rocked" into position.  The wounds were hydrated and te anterior chamber was refilled with balanced salt solution.  The wounds were checked for leakage and rehydrated as necessary.  The lid speculum and drapes were removed and the patient was transported to short stay in good condition.  PATIENT DISPOSITION:  Short Stay

## 2015-06-10 ENCOUNTER — Encounter (HOSPITAL_COMMUNITY): Payer: Self-pay | Admitting: Ophthalmology

## 2015-06-29 DIAGNOSIS — Z803 Family history of malignant neoplasm of breast: Secondary | ICD-10-CM | POA: Diagnosis not present

## 2015-06-29 DIAGNOSIS — Z1231 Encounter for screening mammogram for malignant neoplasm of breast: Secondary | ICD-10-CM | POA: Diagnosis not present

## 2015-06-30 DIAGNOSIS — R69 Illness, unspecified: Secondary | ICD-10-CM | POA: Diagnosis not present

## 2015-07-01 ENCOUNTER — Other Ambulatory Visit: Payer: Self-pay | Admitting: Gastroenterology

## 2015-07-01 DIAGNOSIS — R918 Other nonspecific abnormal finding of lung field: Secondary | ICD-10-CM

## 2015-07-01 DIAGNOSIS — R69 Illness, unspecified: Secondary | ICD-10-CM | POA: Diagnosis not present

## 2015-08-03 ENCOUNTER — Ambulatory Visit
Admission: RE | Admit: 2015-08-03 | Discharge: 2015-08-03 | Disposition: A | Discharge status: Home / Self Care | Payer: Medicare HMO | Source: Ambulatory Visit | Attending: Gastroenterology | Admitting: Gastroenterology

## 2015-08-03 DIAGNOSIS — R918 Other nonspecific abnormal finding of lung field: Secondary | ICD-10-CM

## 2015-08-03 MED ORDER — IOPAMIDOL (ISOVUE-300) INJECTION 61%
75.0000 mL | Freq: Once | INTRAVENOUS | Status: AC | PRN
  Administered 2015-08-03: 75 mL via INTRAVENOUS

## 2015-08-12 DIAGNOSIS — H538 Other visual disturbances: Secondary | ICD-10-CM | POA: Diagnosis not present

## 2015-08-12 DIAGNOSIS — H5213 Myopia, bilateral: Secondary | ICD-10-CM | POA: Diagnosis not present

## 2015-09-28 DIAGNOSIS — D225 Melanocytic nevi of trunk: Secondary | ICD-10-CM | POA: Diagnosis not present

## 2015-09-28 DIAGNOSIS — Z1283 Encounter for screening for malignant neoplasm of skin: Secondary | ICD-10-CM | POA: Diagnosis not present

## 2015-09-28 DIAGNOSIS — D2372 Other benign neoplasm of skin of left lower limb, including hip: Secondary | ICD-10-CM | POA: Diagnosis not present

## 2015-10-28 DIAGNOSIS — R69 Illness, unspecified: Secondary | ICD-10-CM | POA: Diagnosis not present

## 2015-12-23 DIAGNOSIS — Z1389 Encounter for screening for other disorder: Secondary | ICD-10-CM | POA: Diagnosis not present

## 2015-12-23 DIAGNOSIS — Z0000 Encounter for general adult medical examination without abnormal findings: Secondary | ICD-10-CM | POA: Diagnosis not present

## 2015-12-23 DIAGNOSIS — Z7189 Other specified counseling: Secondary | ICD-10-CM | POA: Diagnosis not present

## 2015-12-23 DIAGNOSIS — R918 Other nonspecific abnormal finding of lung field: Secondary | ICD-10-CM | POA: Diagnosis not present

## 2015-12-23 DIAGNOSIS — Z23 Encounter for immunization: Secondary | ICD-10-CM | POA: Diagnosis not present

## 2015-12-29 DIAGNOSIS — R52 Pain, unspecified: Secondary | ICD-10-CM | POA: Diagnosis not present

## 2015-12-29 DIAGNOSIS — J029 Acute pharyngitis, unspecified: Secondary | ICD-10-CM | POA: Diagnosis not present

## 2016-01-25 DIAGNOSIS — L258 Unspecified contact dermatitis due to other agents: Secondary | ICD-10-CM | POA: Diagnosis not present

## 2016-01-28 DIAGNOSIS — E559 Vitamin D deficiency, unspecified: Secondary | ICD-10-CM | POA: Diagnosis not present

## 2016-01-28 DIAGNOSIS — M1990 Unspecified osteoarthritis, unspecified site: Secondary | ICD-10-CM | POA: Diagnosis not present

## 2016-01-28 DIAGNOSIS — I10 Essential (primary) hypertension: Secondary | ICD-10-CM | POA: Diagnosis not present

## 2016-01-28 DIAGNOSIS — R918 Other nonspecific abnormal finding of lung field: Secondary | ICD-10-CM | POA: Diagnosis not present

## 2016-01-28 DIAGNOSIS — G4733 Obstructive sleep apnea (adult) (pediatric): Secondary | ICD-10-CM | POA: Diagnosis not present

## 2016-02-04 DIAGNOSIS — 419620001 Death: Secondary | SNOMED CT | POA: Diagnosis not present

## 2016-02-04 DEATH — deceased

## 2016-05-03 DIAGNOSIS — R69 Illness, unspecified: Secondary | ICD-10-CM | POA: Diagnosis not present

## 2016-05-18 DIAGNOSIS — L218 Other seborrheic dermatitis: Secondary | ICD-10-CM | POA: Diagnosis not present

## 2016-05-18 DIAGNOSIS — L820 Inflamed seborrheic keratosis: Secondary | ICD-10-CM | POA: Diagnosis not present

## 2016-06-20 DIAGNOSIS — Z79899 Other long term (current) drug therapy: Secondary | ICD-10-CM | POA: Diagnosis not present

## 2016-06-20 DIAGNOSIS — Z7982 Long term (current) use of aspirin: Secondary | ICD-10-CM | POA: Diagnosis not present

## 2016-06-20 DIAGNOSIS — I10 Essential (primary) hypertension: Secondary | ICD-10-CM | POA: Diagnosis not present

## 2016-06-20 DIAGNOSIS — Z0000 Encounter for general adult medical examination without abnormal findings: Secondary | ICD-10-CM | POA: Diagnosis not present

## 2016-06-20 DIAGNOSIS — Z6830 Body mass index (BMI) 30.0-30.9, adult: Secondary | ICD-10-CM | POA: Diagnosis not present

## 2016-06-20 DIAGNOSIS — M13142 Monoarthritis, not elsewhere classified, left hand: Secondary | ICD-10-CM | POA: Diagnosis not present

## 2016-06-20 DIAGNOSIS — E669 Obesity, unspecified: Secondary | ICD-10-CM | POA: Diagnosis not present

## 2016-06-20 DIAGNOSIS — M13141 Monoarthritis, not elsewhere classified, right hand: Secondary | ICD-10-CM | POA: Diagnosis not present

## 2016-07-26 DIAGNOSIS — Z1231 Encounter for screening mammogram for malignant neoplasm of breast: Secondary | ICD-10-CM | POA: Diagnosis not present

## 2016-07-26 DIAGNOSIS — Z803 Family history of malignant neoplasm of breast: Secondary | ICD-10-CM | POA: Diagnosis not present

## 2016-08-11 DIAGNOSIS — Z6829 Body mass index (BMI) 29.0-29.9, adult: Secondary | ICD-10-CM | POA: Diagnosis not present

## 2016-08-11 DIAGNOSIS — M154 Erosive (osteo)arthritis: Secondary | ICD-10-CM | POA: Diagnosis not present

## 2016-08-11 DIAGNOSIS — E663 Overweight: Secondary | ICD-10-CM | POA: Diagnosis not present

## 2016-08-11 DIAGNOSIS — M2550 Pain in unspecified joint: Secondary | ICD-10-CM | POA: Diagnosis not present

## 2016-08-11 DIAGNOSIS — L409 Psoriasis, unspecified: Secondary | ICD-10-CM | POA: Diagnosis not present

## 2016-08-11 DIAGNOSIS — R5383 Other fatigue: Secondary | ICD-10-CM | POA: Diagnosis not present

## 2016-08-18 DIAGNOSIS — J410 Simple chronic bronchitis: Secondary | ICD-10-CM | POA: Diagnosis not present

## 2016-08-18 DIAGNOSIS — J370 Chronic laryngitis: Secondary | ICD-10-CM | POA: Diagnosis not present

## 2016-08-18 DIAGNOSIS — J320 Chronic maxillary sinusitis: Secondary | ICD-10-CM | POA: Diagnosis not present

## 2016-08-18 DIAGNOSIS — R05 Cough: Secondary | ICD-10-CM | POA: Diagnosis not present

## 2016-08-18 DIAGNOSIS — J322 Chronic ethmoidal sinusitis: Secondary | ICD-10-CM | POA: Diagnosis not present

## 2016-08-26 DIAGNOSIS — Z961 Presence of intraocular lens: Secondary | ICD-10-CM | POA: Diagnosis not present

## 2016-08-26 DIAGNOSIS — H11441 Conjunctival cysts, right eye: Secondary | ICD-10-CM | POA: Diagnosis not present

## 2016-08-26 DIAGNOSIS — H2511 Age-related nuclear cataract, right eye: Secondary | ICD-10-CM | POA: Diagnosis not present

## 2016-09-09 DIAGNOSIS — E663 Overweight: Secondary | ICD-10-CM | POA: Diagnosis not present

## 2016-09-09 DIAGNOSIS — Z6829 Body mass index (BMI) 29.0-29.9, adult: Secondary | ICD-10-CM | POA: Diagnosis not present

## 2016-09-09 DIAGNOSIS — L409 Psoriasis, unspecified: Secondary | ICD-10-CM | POA: Diagnosis not present

## 2016-09-09 DIAGNOSIS — M154 Erosive (osteo)arthritis: Secondary | ICD-10-CM | POA: Diagnosis not present

## 2016-09-09 DIAGNOSIS — M2550 Pain in unspecified joint: Secondary | ICD-10-CM | POA: Diagnosis not present

## 2016-10-11 DIAGNOSIS — I10 Essential (primary) hypertension: Secondary | ICD-10-CM | POA: Diagnosis not present

## 2016-10-11 DIAGNOSIS — Z23 Encounter for immunization: Secondary | ICD-10-CM | POA: Diagnosis not present

## 2016-10-11 DIAGNOSIS — K5900 Constipation, unspecified: Secondary | ICD-10-CM | POA: Diagnosis not present

## 2016-10-11 DIAGNOSIS — R55 Syncope and collapse: Secondary | ICD-10-CM | POA: Diagnosis not present

## 2016-11-03 DIAGNOSIS — 419620001 Death: Secondary | SNOMED CT | POA: Diagnosis not present

## 2016-11-03 HISTORY — PX: OTHER SURGICAL HISTORY: SHX169

## 2016-11-03 HISTORY — PX: TRANSTHORACIC ECHOCARDIOGRAM: SHX275

## 2016-11-03 DEATH — deceased

## 2016-11-14 DIAGNOSIS — R918 Other nonspecific abnormal finding of lung field: Secondary | ICD-10-CM | POA: Diagnosis not present

## 2016-11-14 DIAGNOSIS — E559 Vitamin D deficiency, unspecified: Secondary | ICD-10-CM | POA: Diagnosis not present

## 2016-11-14 DIAGNOSIS — R55 Syncope and collapse: Secondary | ICD-10-CM | POA: Diagnosis not present

## 2016-11-14 DIAGNOSIS — R000 Tachycardia, unspecified: Secondary | ICD-10-CM | POA: Diagnosis not present

## 2016-11-14 DIAGNOSIS — I10 Essential (primary) hypertension: Secondary | ICD-10-CM | POA: Diagnosis not present

## 2016-11-14 DIAGNOSIS — K5900 Constipation, unspecified: Secondary | ICD-10-CM | POA: Diagnosis not present

## 2016-11-17 ENCOUNTER — Encounter: Payer: Self-pay | Admitting: Cardiology

## 2016-11-17 ENCOUNTER — Ambulatory Visit: Payer: Medicare HMO | Admitting: Cardiology

## 2016-11-17 VITALS — BP 128/84 | HR 100 | Ht 65.0 in | Wt 179.0 lb

## 2016-11-17 DIAGNOSIS — R079 Chest pain, unspecified: Secondary | ICD-10-CM | POA: Insufficient documentation

## 2016-11-17 DIAGNOSIS — R9431 Abnormal electrocardiogram [ECG] [EKG]: Secondary | ICD-10-CM | POA: Diagnosis not present

## 2016-11-17 DIAGNOSIS — I10 Essential (primary) hypertension: Secondary | ICD-10-CM

## 2016-11-17 DIAGNOSIS — R002 Palpitations: Secondary | ICD-10-CM | POA: Diagnosis not present

## 2016-11-17 DIAGNOSIS — R55 Syncope and collapse: Secondary | ICD-10-CM

## 2016-11-17 NOTE — Patient Instructions (Signed)
NO MEDICATION CHANGES EXCEPT WHEN EXERCISE TOLERANCE TEST IS SCHEDULE   SCHEDULE AT [facility] [address]  DO NOT TAKE METOPROLOL THE NIGHT BEFORE OR THE MORNING OF THIS TEST . YOU MAKE RESTART THE DOSE AFTER TEST IS COMPLETED Your physician has requested that you have an exercise tolerance test. For further information please visit [email or web address]. Please also follow instruction sheet, as given.   AND  SCHEDULE AT [place] has requested that you have an echocardiogram. Echocardiography is a painless test that uses sound waves to create images of your heart. It provides your doctor with information about the size and shape of your heart and how well your heart's chambers and valves are working. This procedure takes approximately one hour. There are no restrictions for this procedure.   RECOMMEND YOU GOING TO APP STORE AND DOWNLOADING THE APP. "[employer]" EKG  RECORDER-- IT IS A FREE DOWNLOAD ,BUT YOU MAY HAVE TO PURCHASE THE UPGRADE TO STORE RECORDING.    Your physician recommends that you schedule a follow-up appointment in [place].

## 2016-11-17 NOTE — Progress Notes (Signed)
PCP: Josetta Huddle, MD   [facility] @ [facility] Note: Chief Complaint  Patient presents with  . New Patient (Initial Visit)    balance, passed out while on the toilet , burning in chest     HPI: Lynn Estes is a 71 y.o. female who is being seen today for the evaluation of syncopal episode as well as fast heart rate and burning in the chest at the request of Josetta Huddle, MD  Indie Melvin-Lewis was seen by Dr. Inda Estes on November 12 which was a 4-week follow-up after an episode of syncope.  At that time she noted an episode of chest discomfort as well as an onset of rapid palpitations.  Her heart rate was 130 bpm when she was in the clinic.  Unfortunately the EKG only was captured with her going 99 beats a minute.  Recent Hospitalizations: None  Studies Personally Reviewed - (if available, images/films reviewed: From Epic Chart or Care Everywhere)  None  Interval History: "Lynn Estes" presents here for evaluation of the episode of substernal chest discomfort with rapid heartbeat that occurred a couple days ago.  As were her episode of syncope, that was thought to be vagal mediated -passing out while on the toilet..  The episode that occurred at her PCPs office was shortly after eating breakfast. She notes that since about May she has been having occasional episodes of rapid heartbeat spells that started to bother her about a month ago.  She says that sometimes when these heart rates go so fast she feels lightheaded and dizzy with it.  They do not happen very often, have not had one in about a month and then had one in the doctor's office.  Usually she does not have palpitations beforehand or just intermittently.  She just will also note her heart rate go up fast she feels a throbbing sensation for she feels tightness in her chest it will last up to 10 minutes and then goes away.  Usually her heart rate ranges in the 80-90 bpm.  She was started on metoprolol by her PCP, but has not yet  begun to take it.  She is only been on losartan and HCTZ for hypertension. She did not note this type of symptom when she had her passout spell however. She describes the palpitations as being regular in rhythm, and fast.   Besides these episodes, she is not really had any Resting or exertional chest tightness or pressure.  No resting or exertional dyspnea or PND, orthopnea, edema.  No recurrent syncope or near syncope.  No TIA or amaurosis fugax symptoms.  She also denies claudication.  ROS: A comprehensive was performed. Review of Systems  Constitutional: Negative for malaise/fatigue and weight loss.  HENT: Negative for congestion and nosebleeds.   Respiratory: Negative for cough, shortness of breath and wheezing.   Gastrointestinal: Negative for abdominal pain, blood in stool, heartburn and melena.  Genitourinary: Negative for dysuria and hematuria.  Musculoskeletal: Negative for falls and joint pain.  Neurological: Negative for focal weakness, seizures and weakness.       Per HPI  Endo/Heme/Allergies: Negative for environmental allergies. Does not bruise/bleed easily.  Psychiatric/Behavioral: Negative for memory loss. The patient is not nervous/anxious and does not have insomnia.   All other systems reviewed and are negative.   I have reviewed and (if needed) personally updated the patient's problem list, medications, allergies, past medical and surgical history, social and family history.   Past Medical History:  Diagnosis Date  .  Anemia   . Arthritis   . Asthma   . Diverticulitis   . History of shingles   . Hypertension   . Lung nodules    Nodules noted on CT scan July 2017  . OSA (obstructive sleep apnea)   . Osteoarthritis   . Partial small bowel obstruction [facility])     Past Surgical History:  Procedure Laterality Date  . CATARACT EXTRACTION W/PHACO Left 06/08/2015   Procedure: CATARACT EXTRACTION PHACO AND INTRAOCULAR LENS PLACEMENT LEFT EYE CDE=7.18;  Surgeon: Williams Che, MD;  Location: [facility];  Service: Ophthalmology;  Laterality: Left;  . DILATION AND CURETTAGE OF UTERUS     X 2  . right rotator cuff surgery      Current Meds  Medication Sig  . aspirin 81 MG tablet Take 81 mg by mouth daily as needed for pain.   . hydrochlorothiazide 25 MG tablet Take 25 mg by mouth daily.    Marland Kitchen losartan (COZAAR) 50 MG tablet Take 50 mg daily by mouth.  . promethazine (PHENERGAN) 25 MG tablet Take 0.5 tablets (12.5 mg total) by mouth every 8 (eight) hours as needed for nausea.    No Known Allergies  Social History   Socioeconomic History  . Marital status: Married    Spouse name: None  . Number of children: 2  . Years of education: 71  . Highest education level: None  Social Needs  . Financial resource strain: None  . Food insecurity - worry: None  . Food insecurity - inability: None  . Transportation needs - medical: None  . Transportation needs - non-medical: None  Occupational History  . Occupation: Medical illustrator    Comment: [employer]  Tobacco Use  . Smoking status: Never Smoker  . Smokeless tobacco: Never Used  Substance and Sexual Activity  . Alcohol use: Yes    Alcohol/week: 3.0 oz    Types: 5 Glasses of wine per week  . Drug use: No  . Sexual activity: None  Other Topics Concern  . None  Social History Narrative   PFT---08-20-2009   fev1-2.04, 92%, fev1/fvc 0.79   Widowed, remarried.  2 kids   Works part time -Doctor, general practice     family history includes Hypertension in her brother.  Wt Readings from Last 3 Encounters:  11/17/16 179 lb (81.2 kg)  06/08/15 175 lb (79.4 kg)  06/02/15 175 lb (79.4 kg)    PHYSICAL EXAM BP 128/84 (BP Location: Left Arm, Patient Position: Sitting, Cuff Size: Normal)   Pulse 100   Ht 5\' 5"  (1.651 m)   Wt 179 lb (81.2 kg)   BMI 29.79 kg/m  Physical Exam  Constitutional: She is oriented to person, place, and time. She appears well-developed and well-nourished. No  distress.  Pleasant.  Healthy-appearing  HENT:  Head: Normocephalic and atraumatic.  Nose: Nose normal.  Mouth/Throat: No oropharyngeal exudate.  Neck: Normal range of motion. Neck supple. Normal carotid pulses and no JVD present. Carotid bruit is not present. No thyromegaly present.  Cardiovascular: Normal rate, regular rhythm, normal heart sounds and intact distal pulses.  No extrasystoles are present. PMI is not displaced. Exam reveals no gallop and no friction rub.  No murmur heard. Pulmonary/Chest: Effort normal and breath sounds normal. No respiratory distress. She has no wheezes. She has no rales.  Abdominal: Soft. Bowel sounds are normal. She exhibits no distension. There is no tenderness. There is no rebound.  Musculoskeletal: Normal range of motion. She exhibits no edema.  Lymphadenopathy:    She has no cervical adenopathy.  Neurological: She is alert and oriented to person, place, and time. No cranial nerve deficit.  Skin: Skin is warm and dry. No rash noted. No erythema.  Psychiatric: She has a normal mood and affect. Her behavior is normal. Judgment and thought content normal.  Nursing note and vitals reviewed.   Adult ECG Report  Rate: 98 ;  Rhythm: normal sinus rhythm and Likely LVH with repolarization of normality.  Left axis deviation (-52 degrees).;   Narrative Interpretation: Stable, no change from April 2017.   Other studies Reviewed: Additional studies/ records that were reviewed today include:  Recent Labs:  None available   ASSESSMENT / PLAN: Problem List Items Addressed This Visit    Abnormal EKG (Chronic)    LVH with repolarization abnormality but cannot exclude ischemia. Plan will be to check GXT to ensure no changes.  This will also determine symptoms from tachycardia.      Relevant Orders   EKG 12-Lead (Completed)   ECHOCARDIOGRAM COMPLETE (Completed)   EXERCISE TOLERANCE TEST (ETT)   Chest pain with low risk for cardiac etiology    She described a  chest burning sensation and then also had tachycardia.  It could very well be that she felt her heart rate going fast and that was the cause.  We will check a GXT/ETT (graduated exercise test, exercise tolerance test).  We will also check 2D echocardiogram to evaluate the LVH.      Relevant Orders   EKG 12-Lead (Completed)   ECHOCARDIOGRAM COMPLETE (Completed)   EXERCISE TOLERANCE TEST (ETT)   Essential hypertension (Chronic)    Blood pressures well controlled today.  She has not yet taken the metoprolol that she has written.  She is on moderate dose of losartan and HCTZ.  We will need to monitor to see what her blood pressure does with the addition of metoprolol.      Relevant Medications   losartan (COZAAR) 50 MG tablet   metoprolol tartrate (LOPRESSOR) 25 MG tablet   Rapid palpitations - Primary    She does have a palpitation occur with great frequency.  She is not sure how a time she has had it in the last sure that she wore monitor for entire month but anything will be caught.  We talked about different ways to evaluated in I think a portable monitor would be a viable option.  We discussed the [place] phone APP with she would prefer to use thismonitor pads --then wearing event monitor.  This will allow her to simply put a finger on the pad detected rhythm versus continuously wearing a monitor.      Relevant Orders   ECHOCARDIOGRAM COMPLETE (Completed)   EXERCISE TOLERANCE TEST (ETT)   Vasovagal syncope    The episode occurred while on the toilet.  She apparently has had at least one other episode similar to this.  Denied any palpitations during the lead up to the passout spell. Discussed the importance of staying adequately hydrated and standing up slowly.      Relevant Medications   losartan (COZAAR) 50 MG tablet   metoprolol tartrate (LOPRESSOR) 25 MG tablet   Other Relevant Orders   ECHOCARDIOGRAM COMPLETE (Completed)      Current medicines are reviewed at length with  the patient today. (+/- concerns) n/a The following changes have been made: start taking Metoprolol.   Patient Instructions  NO MEDICATION CHANGES EXCEPT WHEN EXERCISE TOLERANCE TEST IS SCHEDULE  SCHEDULE AT [facility] [address]  DO NOT TAKE METOPROLOL THE NIGHT BEFORE OR THE MORNING OF THIS TEST . YOU MAKE RESTART THE DOSE AFTER TEST IS COMPLETED Your physician has requested that you have an exercise tolerance test. For further information please visit [email or web address]. Please also follow instruction sheet, as given.   AND  SCHEDULE AT [place] has requested that you have an echocardiogram. Echocardiography is a painless test that uses sound waves to create images of your heart. It provides your doctor with information about the size and shape of your heart and how well your heart's chambers and valves are working. This procedure takes approximately one hour. There are no restrictions for this procedure.   RECOMMEND YOU GOING TO APP STORE AND DOWNLOADING THE APP. "[employer]" EKG  RECORDER-- IT IS A FREE DOWNLOAD ,BUT YOU MAY HAVE TO PURCHASE THE UPGRADE TO STORE RECORDING.    Your physician recommends that you schedule a follow-up appointment in [place].    Studies Ordered:   Orders Placed This Encounter  Procedures  . EXERCISE TOLERANCE TEST (ETT)  . EKG 12-Lead  . ECHOCARDIOGRAM COMPLETE      Glenetta Hew, M.D., M.S. Interventional Cardiologist   Pager # [phone] Phone # [phone] [address] [place], [place] [address]

## 2016-11-18 ENCOUNTER — Telehealth (HOSPITAL_COMMUNITY): Payer: Self-pay

## 2016-11-18 NOTE — Telephone Encounter (Signed)
Encounter complete. 

## 2016-11-21 DIAGNOSIS — L820 Inflamed seborrheic keratosis: Secondary | ICD-10-CM | POA: Diagnosis not present

## 2016-11-21 DIAGNOSIS — L218 Other seborrheic dermatitis: Secondary | ICD-10-CM | POA: Diagnosis not present

## 2016-11-22 ENCOUNTER — Other Ambulatory Visit: Payer: Self-pay

## 2016-11-22 ENCOUNTER — Ambulatory Visit (HOSPITAL_COMMUNITY): Payer: Medicare HMO | Attending: Cardiology

## 2016-11-22 VITALS — BP 138/79

## 2016-11-22 DIAGNOSIS — R9431 Abnormal electrocardiogram [ECG] [EKG]: Secondary | ICD-10-CM

## 2016-11-22 DIAGNOSIS — R002 Palpitations: Secondary | ICD-10-CM

## 2016-11-22 DIAGNOSIS — I10 Essential (primary) hypertension: Secondary | ICD-10-CM | POA: Insufficient documentation

## 2016-11-22 DIAGNOSIS — R079 Chest pain, unspecified: Secondary | ICD-10-CM

## 2016-11-22 DIAGNOSIS — R55 Syncope and collapse: Secondary | ICD-10-CM | POA: Diagnosis not present

## 2016-11-22 LAB — ECHOCARDIOGRAM COMPLETE
CHL CUP DOP CALC LVOT VTI: 25.6 cm
CHL CUP TV REG PEAK VELOCITY: 198 cm/s
EERAT: 17.65
EWDT: 239 ms
FS: 27 % — AB (ref 28–44)
IV/PV OW: 0.87
LA diam end sys: 38 mm
LA diam index: 1.94 cm/m2
LA vol index: 31.3 mL/m2
LASIZE: 38 mm
LAVOL: 61.1 mL
LAVOLA4C: 46.9 mL
LV E/e' medial: 17.65
LV E/e'average: 17.65
LV TDI E'LATERAL: 5.44
LV e' LATERAL: 5.44 cm/s
LVOT SV: 97 mL
LVOT area: 3.8 cm2
LVOT peak vel: 109 cm/s
LVOTD: 22 mm
MV Dec: 239
MV Peak grad: 4 mmHg
MVAP: 3.14 cm2
MVPKAVEL: 102 m/s
MVPKEVEL: 96 m/s
MVSPHT: 70 ms
P 1/2 time: 681 ms
PW: 15 mm — AB (ref 0.6–1.1)
Peak grad: 198 mmHg
RV LATERAL S' VELOCITY: 13.1 cm/s
RV TAPSE: 25.4 mm
RV sys press: 19 mmHg
TDI e' medial: 6.74
TRMAXVEL: 198 cm/s

## 2016-11-22 MED ORDER — PERFLUTREN LIPID MICROSPHERE
1.0000 mL | INTRAVENOUS | Status: AC | PRN
  Administered 2016-11-22: 1 mL via INTRAVENOUS

## 2016-11-23 ENCOUNTER — Ambulatory Visit (HOSPITAL_COMMUNITY)
Admission: RE | Admit: 2016-11-23 | Discharge: 2016-11-23 | Disposition: A | Discharge status: Home / Self Care | Payer: Medicare HMO | Source: Ambulatory Visit | Attending: Cardiology | Admitting: Cardiology

## 2016-11-23 DIAGNOSIS — R002 Palpitations: Secondary | ICD-10-CM | POA: Diagnosis not present

## 2016-11-23 DIAGNOSIS — R9431 Abnormal electrocardiogram [ECG] [EKG]: Secondary | ICD-10-CM | POA: Diagnosis not present

## 2016-11-23 DIAGNOSIS — R079 Chest pain, unspecified: Secondary | ICD-10-CM | POA: Insufficient documentation

## 2016-11-24 ENCOUNTER — Encounter: Payer: Self-pay | Admitting: Cardiology

## 2016-11-24 NOTE — Assessment & Plan Note (Signed)
The episode occurred while on the toilet.  She apparently has had at least one other episode similar to this.  Denied any palpitations during the lead up to the passout spell. Discussed the importance of staying adequately hydrated and standing up slowly.

## 2016-11-24 NOTE — Assessment & Plan Note (Signed)
Blood pressures well controlled today.  She has not yet taken the metoprolol that she has written.  She is on moderate dose of losartan and HCTZ.  We will need to monitor to see what her blood pressure does with the addition of metoprolol.

## 2016-11-24 NOTE — Assessment & Plan Note (Signed)
She does have a palpitation occur with great frequency.  She is not sure how a time she has had it in the last sure that she wore monitor for entire month but anything will be caught.  We talked about different ways to evaluated in I think a portable monitor would be a viable option.  We discussed the [place] phone APP with she would prefer to use thismonitor pads --then wearing event monitor.  This will allow her to simply put a finger on the pad detected rhythm versus continuously wearing a monitor.

## 2016-11-24 NOTE — Assessment & Plan Note (Signed)
LVH with repolarization abnormality but cannot exclude ischemia. Plan will be to check GXT to ensure no changes.  This will also determine symptoms from tachycardia.

## 2016-11-24 NOTE — Assessment & Plan Note (Signed)
She described a chest burning sensation and then also had tachycardia.  It could very well be that she felt her heart rate going fast and that was the cause.  We will check a GXT/ETT (graduated exercise test, exercise tolerance test).  We will also check 2D echocardiogram to evaluate the LVH.

## 2016-11-28 DIAGNOSIS — Z124 Encounter for screening for malignant neoplasm of cervix: Secondary | ICD-10-CM | POA: Diagnosis not present

## 2016-11-28 DIAGNOSIS — Z01419 Encounter for gynecological examination (general) (routine) without abnormal findings: Secondary | ICD-10-CM | POA: Diagnosis not present

## 2016-11-28 LAB — EXERCISE TOLERANCE TEST
CHL CUP MPHR: 150 {beats}/min
CHL CUP RESTING HR STRESS: 78 {beats}/min
CHL RATE OF PERCEIVED EXERTION: 18
CSEPEDS: 24 s
CSEPHR: 97 %
CSEPPHR: 146 {beats}/min
Estimated workload: 7.5 METS
Exercise duration (min): 6 min

## 2016-11-29 DIAGNOSIS — Z124 Encounter for screening for malignant neoplasm of cervix: Secondary | ICD-10-CM | POA: Diagnosis not present

## 2016-12-01 ENCOUNTER — Telehealth: Payer: Self-pay | Admitting: Cardiology

## 2016-12-01 NOTE — Telephone Encounter (Signed)
New message     Pt was returning call for lab results

## 2016-12-01 NOTE — Telephone Encounter (Signed)
Notes recorded by Leonie Man, MD on 11/29/2016 at 9:44 PM EST Exercise treadmill stress test shows decent exercise tolerance with high blood pressure response to exercise. But no EKG findings or symptoms to suggest heart artery disease.  Pt notified she is asking for ECHO result

## 2016-12-02 ENCOUNTER — Telehealth: Payer: Self-pay | Admitting: Cardiology

## 2016-12-02 NOTE — Telephone Encounter (Signed)
Pt notified expresses thanks for the info

## 2016-12-02 NOTE — Telephone Encounter (Signed)
Returned call to pt she stated that she had not started the metoprolol before her testing she started after and she just wanted to make sure that she was supposed to start taking it. Relayed relevant info from LOV (Essential hypertension (Chronic) Blood pressures well controlled today.  She has not yet taken the metoprolol that she has written.  She is on moderate dose of losartan and HCTZ.  We will need to monitor to see what her blood pressure does with the addition of metoprolol. Relevant Medications: losartan (COZAAR) 50 MG tablet, metoprolol tartrate (LOPRESSOR) 25 MG tablet) Pt will continue taking the metoprolol as directed and take BP and HR daily and call back if any BP #'s are concerning.

## 2016-12-02 NOTE — Telephone Encounter (Signed)
Mrs. Lynn Estes -[name] is calling to find out if she is to continue to take the Beta Blocker . Please call

## 2016-12-02 NOTE — Telephone Encounter (Signed)
Funny.  I thought I did a result note on her echo.  It is not in my in box. Echocardiogram looks good totally normal.  Hyperdynamic ventricle but no valve issues.  No regional wall motion normalities.  Essentially normal  Glenetta Hew, MD

## 2016-12-19 ENCOUNTER — Encounter: Payer: Self-pay | Admitting: Cardiology

## 2016-12-19 ENCOUNTER — Ambulatory Visit: Payer: Medicare HMO | Admitting: Cardiology

## 2016-12-19 VITALS — BP 138/90 | HR 78 | Ht 65.0 in | Wt 181.0 lb

## 2016-12-19 DIAGNOSIS — I10 Essential (primary) hypertension: Secondary | ICD-10-CM

## 2016-12-19 DIAGNOSIS — R079 Chest pain, unspecified: Secondary | ICD-10-CM | POA: Diagnosis not present

## 2016-12-19 DIAGNOSIS — R9431 Abnormal electrocardiogram [ECG] [EKG]: Secondary | ICD-10-CM | POA: Diagnosis not present

## 2016-12-19 DIAGNOSIS — R002 Palpitations: Secondary | ICD-10-CM

## 2016-12-19 DIAGNOSIS — R55 Syncope and collapse: Secondary | ICD-10-CM | POA: Diagnosis not present

## 2016-12-19 NOTE — Progress Notes (Signed)
PCP: Josetta Huddle, MD   [facility] @ [facility] Note: Chief Complaint  Patient presents with  . Follow-up    1 month - Echo & GXT results  . Chest Pain    none since last visit  . Loss of Consciousness    none since last visit    HPI: Lynn Estes is a 71 y.o. female who is being seen today for follow-up evaluation of syncopal episode as well as fast heart rate and burning in the chest at the request of Josetta Huddle, MD  Lynn Estes was seen by Dr. Inda Merlin on November 12 which was a 4-week follow-up after an episode of syncope.  At that time she noted an episode of chest discomfort as well as an onset of rapid palpitations.  Her heart rate was 130 bpm when she was in the clinic.  Unfortunately the EKG only was captured with her going 99 beats a minute.  Recent Hospitalizations: None  Studies Personally Reviewed - (if available, images/films reviewed: From Epic Chart or Care Everywhere)  2 D Echo 911/20/2018): Normal LV size and function.  GR 1 DD.  Moderate concentric hypertrophy.  Vigorous LV function with EF of 65-70%.  GXT/ETT (11/28/2016): Low Exercise Tolerance - 6:24 min (dyspnea, fatigue), upsloping ST segment depression noted.  Nondiagnostic.  HR 150 bpm = 97% MPHR). Hypertensive response to exercise. NEGATIVE test.  No arrhythmias.  Interval History: "Lynn Estes" presents here for follow-up doing quite well.  She has not had any further episodes of chest discomfort or fast heart beats.  She also denies any further episodes of syncope or near syncope.  She feels well and was quite happy to hear the results of her Echo & GXT.    Remainder of cardiac review of symptoms: No chest pain or pressure at rest or with exertion. No resting dyspnea - but does get a bit short of breath if she exerts herself beyond simple walking. No PND, orthopnea or edema.  No palpitations, lightheadedness, dizziness, weakness or syncope/near syncope. No TIA/amaurosis fugax  symptoms. No claudication.    ROS: A comprehensive was not performed. Review of Systems  Constitutional: Negative for malaise/fatigue and weight loss.  HENT: Negative for congestion and nosebleeds.   Respiratory: Negative for cough, shortness of breath and wheezing.   Gastrointestinal: Negative for abdominal pain, blood in stool, heartburn and melena.  Genitourinary: Negative for dysuria and hematuria.  Musculoskeletal: Negative for falls and joint pain.  Neurological: Negative for weakness.       Per HPI    I have reviewed and (if needed) personally updated the patient's problem list, medications, allergies, past medical and surgical history, social and family history.   Past Medical History:  Diagnosis Date  . Anemia   . Arthritis   . Asthma   . Diverticulitis   . History of shingles   . Hypertension   . Lung nodules    Nodules noted on CT scan July 2017  . OSA (obstructive sleep apnea)   . Osteoarthritis   . Partial small bowel obstruction [facility])     Past Surgical History:  Procedure Laterality Date  . CATARACT EXTRACTION W/PHACO Left 06/08/2015   Procedure: CATARACT EXTRACTION PHACO AND INTRAOCULAR LENS PLACEMENT LEFT EYE CDE=7.18;  Surgeon: Williams Che, MD;  Location: [facility];  Service: Ophthalmology;  Laterality: Left;  . DILATION AND CURETTAGE OF UTERUS     X 2  . EXERCISE TOLERANCE TEST (GXT)  11/2016   Low Exercise Tolerance -  6:24 min (dyspnea, fatigue), upsloping ST segment depression noted.  Nondiagnostic.  HR 150 bpm = 97% MPHR). Hypertensive response to exercise. NEGATIVE test.  No arrhythmias.  . right rotator cuff surgery    . TRANSTHORACIC ECHOCARDIOGRAM  11/2016   Normal LV size and function.  GR 1 DD.  Moderate concentric hypertrophy.  Vigorous LV function with EF of 65-70%.    Current Meds  Medication Sig  . aspirin 81 MG tablet Take 81 mg by mouth daily as needed for pain.   . hydrochlorothiazide 25 MG tablet Take 25 mg by mouth daily.    Marland Kitchen  losartan (COZAAR) 50 MG tablet Take 50 mg daily by mouth.  . metoprolol tartrate (LOPRESSOR) 25 MG tablet Take 25 mg daily by mouth.    No Known Allergies  Social History   Socioeconomic History  . Marital status: Married    Spouse name: None  . Number of children: 2  . Years of education: 42  . Highest education level: None  Social Needs  . Financial resource strain: None  . Food insecurity - worry: None  . Food insecurity - inability: None  . Transportation needs - medical: None  . Transportation needs - non-medical: None  Occupational History  . Occupation: Medical illustrator    Comment: Chief Executive Officer  Tobacco Use  . Smoking status: Never Smoker  . Smokeless tobacco: Never Used  Substance and Sexual Activity  . Alcohol use: Yes    Alcohol/week: 3.0 oz    Types: 5 Glasses of wine per week  . Drug use: No  . Sexual activity: None  Other Topics Concern  . None  Social History Narrative   PFT---08-20-2009   fev1-2.04, 92%, fev1/fvc 0.79   Widowed, remarried.  2 kids   Works part time -Doctor, general practice     family history includes Hypertension in her brother.  Wt Readings from Last 3 Encounters:  12/19/16 181 lb (82.1 kg)  11/17/16 179 lb (81.2 kg)  06/08/15 175 lb (79.4 kg)    PHYSICAL EXAM BP 138/90   Pulse 78   Ht 5\' 5"  (1.651 m)   Wt 181 lb (82.1 kg)   BMI 30.12 kg/m  Physical Exam  Constitutional: She is oriented to person, place, and time. She appears well-developed and well-nourished. No distress.  Pleasant.  Healthy-appearing  HENT:  Head: Normocephalic and atraumatic.  Nose: Nose normal.  Mouth/Throat: No oropharyngeal exudate.  Neck: Normal range of motion. Neck supple. Normal carotid pulses and no JVD present. Carotid bruit is not present.  Cardiovascular: Normal rate, regular rhythm, normal heart sounds and intact distal pulses.  No extrasystoles are present. PMI is not displaced. Exam reveals no gallop and no friction rub.   No murmur heard. Pulmonary/Chest: Effort normal and breath sounds normal. No respiratory distress. She has no wheezes. She has no rales.  Abdominal: Soft. Bowel sounds are normal. She exhibits no distension. There is no tenderness.  Musculoskeletal: Normal range of motion. She exhibits no edema.  Neurological: She is alert and oriented to person, place, and time.  Psychiatric: She has a normal mood and affect. Her behavior is normal. Judgment and thought content normal.  Nursing note and vitals reviewed.   Adult ECG Report Not checked   Other studies Reviewed: Additional studies/ records that were reviewed today include:  Recent Labs:  None available   ASSESSMENT / PLAN: Problem List Items Addressed This Visit    Abnormal EKG (Chronic)    LVH with repolarization noted  on EKG confirmed on echocardiogram.      Chest pain with low risk for cardiac etiology - Primary    Atypical chest pain.  Not reproduced on stress test.  Negative GXT. Normal echo.      Essential hypertension (Chronic)    Borderline pressure today.  Recommend she continue taking the Cozaar and HCTZ, but would take her metoprolol 12.5mg  BID as opposed to 25 mg daily.      Rapid palpitations     These rapid heart rate spells do not occur with great frequency.  She is on a beta-blocker.  I simply said if she has more symptoms, she can take an extra dose of metoprolol.  Structurally normal echocardiogram.  Does have some LVH which could explain exertional dyspnea.      Vasovagal syncope    No further episodes of syncope.  Episode occurred shortly after going to the bathroom.  Most consistent with post micturition vasovagal syncope.  Discussed importance of staying adequately hydrated and standing up slowly.         Current medicines are reviewed at length with the patient today. (+/- concerns) n/a The following changes have been made: start taking Metoprolol.   Patient Instructions  MEDICATION  INSTRUCTIONS  MAY USE EXTRA DOSE OF  METOPROLOL - IF  YOU HAVE FAST HEART BEAT     Your physician recommends that you schedule a follow-up appointment ON AN AS NEEDED BASIS    Studies Ordered:   No orders of the defined types were placed in this encounter.     Glenetta Hew, M.D., M.S. Interventional Cardiologist   Pager # [phone] Phone # [phone] [address] [place], [place] [address]

## 2016-12-19 NOTE — Patient Instructions (Signed)
MEDICATION INSTRUCTIONS  MAY USE EXTRA DOSE OF  METOPROLOL - IF  YOU HAVE FAST HEART BEAT     Your physician recommends that you schedule a follow-up appointment ON AN AS NEEDED BASIS

## 2016-12-25 ENCOUNTER — Encounter: Payer: Self-pay | Admitting: Cardiology

## 2016-12-25 NOTE — Assessment & Plan Note (Signed)
Borderline pressure today.  Recommend she continue taking the Cozaar and HCTZ, but would take her metoprolol 12.5mg  BID as opposed to 25 mg daily.

## 2016-12-25 NOTE — Assessment & Plan Note (Signed)
No further episodes of syncope.  Episode occurred shortly after going to the bathroom.  Most consistent with post micturition vasovagal syncope.  Discussed importance of staying adequately hydrated and standing up slowly.

## 2016-12-25 NOTE — Assessment & Plan Note (Signed)
LVH with repolarization noted on EKG confirmed on echocardiogram.

## 2016-12-25 NOTE — Assessment & Plan Note (Addendum)
  These rapid heart rate spells do not occur with great frequency.  She is on a beta-blocker.  I simply said if she has more symptoms, she can take an extra dose of metoprolol.  Structurally normal echocardiogram.  Does have some LVH which could explain exertional dyspnea.

## 2016-12-25 NOTE — Assessment & Plan Note (Signed)
Atypical chest pain.  Not reproduced on stress test.  Negative GXT. Normal echo.

## 2017-01-03 HISTORY — PX: ANKLE SURGERY: SHX546

## 2017-01-12 DIAGNOSIS — E669 Obesity, unspecified: Secondary | ICD-10-CM | POA: Diagnosis not present

## 2017-01-12 DIAGNOSIS — M5432 Sciatica, left side: Secondary | ICD-10-CM | POA: Diagnosis not present

## 2017-01-12 DIAGNOSIS — M2550 Pain in unspecified joint: Secondary | ICD-10-CM | POA: Diagnosis not present

## 2017-01-12 DIAGNOSIS — L409 Psoriasis, unspecified: Secondary | ICD-10-CM | POA: Diagnosis not present

## 2017-01-12 DIAGNOSIS — M154 Erosive (osteo)arthritis: Secondary | ICD-10-CM | POA: Diagnosis not present

## 2017-01-12 DIAGNOSIS — Z6830 Body mass index (BMI) 30.0-30.9, adult: Secondary | ICD-10-CM | POA: Diagnosis not present

## 2017-01-17 DIAGNOSIS — R69 Illness, unspecified: Secondary | ICD-10-CM | POA: Diagnosis not present

## 2017-03-08 DIAGNOSIS — R918 Other nonspecific abnormal finding of lung field: Secondary | ICD-10-CM | POA: Diagnosis not present

## 2017-03-08 DIAGNOSIS — E559 Vitamin D deficiency, unspecified: Secondary | ICD-10-CM | POA: Diagnosis not present

## 2017-03-08 DIAGNOSIS — Z7189 Other specified counseling: Secondary | ICD-10-CM | POA: Diagnosis not present

## 2017-03-08 DIAGNOSIS — Z1389 Encounter for screening for other disorder: Secondary | ICD-10-CM | POA: Diagnosis not present

## 2017-03-08 DIAGNOSIS — Z13820 Encounter for screening for osteoporosis: Secondary | ICD-10-CM | POA: Diagnosis not present

## 2017-03-08 DIAGNOSIS — K5900 Constipation, unspecified: Secondary | ICD-10-CM | POA: Diagnosis not present

## 2017-03-08 DIAGNOSIS — Z79899 Other long term (current) drug therapy: Secondary | ICD-10-CM | POA: Diagnosis not present

## 2017-03-08 DIAGNOSIS — M179 Osteoarthritis of knee, unspecified: Secondary | ICD-10-CM | POA: Diagnosis not present

## 2017-03-08 DIAGNOSIS — Z0000 Encounter for general adult medical examination without abnormal findings: Secondary | ICD-10-CM | POA: Diagnosis not present

## 2017-03-08 DIAGNOSIS — I10 Essential (primary) hypertension: Secondary | ICD-10-CM | POA: Diagnosis not present

## 2017-03-08 DIAGNOSIS — L408 Other psoriasis: Secondary | ICD-10-CM | POA: Diagnosis not present

## 2017-05-03 DIAGNOSIS — Z780 Asymptomatic menopausal state: Secondary | ICD-10-CM | POA: Diagnosis not present

## 2017-05-03 DIAGNOSIS — Z13820 Encounter for screening for osteoporosis: Secondary | ICD-10-CM | POA: Diagnosis not present

## 2017-05-03 LAB — HM DEXA SCAN

## 2017-05-11 ENCOUNTER — Encounter: Payer: Self-pay | Admitting: *Deleted

## 2017-05-26 ENCOUNTER — Ambulatory Visit (INDEPENDENT_AMBULATORY_CARE_PROVIDER_SITE_OTHER): Payer: Medicare HMO | Admitting: Family Medicine

## 2017-05-26 ENCOUNTER — Other Ambulatory Visit: Payer: Self-pay

## 2017-05-26 ENCOUNTER — Encounter: Payer: Self-pay | Admitting: Family Medicine

## 2017-05-26 VITALS — BP 144/82 | HR 67 | Temp 97.7°F | Resp 14 | Ht 64.5 in | Wt 184.0 lb

## 2017-05-26 DIAGNOSIS — M19041 Primary osteoarthritis, right hand: Secondary | ICD-10-CM | POA: Diagnosis not present

## 2017-05-26 DIAGNOSIS — M19042 Primary osteoarthritis, left hand: Secondary | ICD-10-CM | POA: Diagnosis not present

## 2017-05-26 DIAGNOSIS — M5431 Sciatica, right side: Secondary | ICD-10-CM

## 2017-05-26 DIAGNOSIS — I10 Essential (primary) hypertension: Secondary | ICD-10-CM

## 2017-05-26 DIAGNOSIS — Z7689 Persons encountering health services in other specified circumstances: Secondary | ICD-10-CM | POA: Diagnosis not present

## 2017-05-26 MED ORDER — DICLOFENAC SODIUM 75 MG PO TBEC: 75.0000 mg | DELAYED_RELEASE_TABLET | Freq: Two times a day (BID) | ORAL | 0 refills | Status: DC

## 2017-05-26 NOTE — Progress Notes (Addendum)
Subjective:    Patient ID: Lynn Estes, female    DOB: 05/20/1945, 72 y.o.   MRN: [id number]  HPI Patient is here today to establish care.  Patient is a very pleasant 72 year old Caucasian female who I have known since I was a child.  She is due for her mammogram in June but she will schedule this.  She had a bone density test performed last year that per her report was normal and she states that she will bring that at her physical in January.  She believes her colonoscopy is up-to-date however I have no records to review at the present time.  She gets a Pap smear every 3 years performed by her gynecologist.  Her immunizations are listed below: Immunization History  Administered Date(s) Administered  . Influenza Split 11/10/2011, 10/03/2013  . Influenza,inj,Quad PF,6+ Mos 11/22/2012  . Pneumococcal Polysaccharide-23 09/10/2011, 10/03/2012  . Tdap 08/29/2011   She believes she has also had Prevnar 13.  She declines a shingles vaccine.  She does report osteoarthritic changes in both hands.  She has significant deformity at the DIP joints on her index fingers bilaterally as well as arthritic nodules at the DIP joints of multiple fingers.  She has seen rheumatology and has had evaluation for psoriatic arthritis as well as rheumatoid arthritis and those conditions have been ruled out by the rheumatologist.  She has been diagnosed with osteoarthritis.  She is using aspirin occasionally for this.  She also complains of pain radiating from her right gluteus down her right leg into her right foot.  Pain started 1 week ago and seems to originate from her lower back.  She denies any numbness or tingling in her saddle area.  She denies any weakness. Past Medical History:  Diagnosis Date  . Asthma   . Cataract   . Diverticulitis   . History of shingles   . Hypertension   . Lung nodules    Nodules noted on CT scan July 2017  . OSA (obstructive sleep apnea)   . Partial small bowel obstruction [facility])     Past Surgical History:  Procedure Laterality Date  . CATARACT EXTRACTION W/PHACO Left 06/08/2015   Procedure: CATARACT EXTRACTION PHACO AND INTRAOCULAR LENS PLACEMENT LEFT EYE CDE=7.18;  Surgeon: Williams Che, MD;  Location: [facility];  Service: Ophthalmology;  Laterality: Left;  . DILATION AND CURETTAGE OF UTERUS     X 2  . EXERCISE TOLERANCE TEST (GXT)  11/2016   Low Exercise Tolerance - 6:24 min (dyspnea, fatigue), upsloping ST segment depression noted.  Nondiagnostic.  HR 150 bpm = 97% MPHR). Hypertensive response to exercise. NEGATIVE test.  No arrhythmias.  . right rotator cuff surgery    . TRANSTHORACIC ECHOCARDIOGRAM  11/2016   Normal LV size and function.  GR 1 DD.  Moderate concentric hypertrophy.  Vigorous LV function with EF of 65-70%.   Current Outpatient Medications on File Prior to Visit  Medication Sig Dispense Refill  . Cholecalciferol (VITAMIN D PO) Take by mouth.    . Cyanocobalamin (VITAMIN B 12 PO) Take by mouth.    . Fluocinolone Acetonide Scalp 0.01 % OIL Apply topically.    . hydrochlorothiazide 25 MG tablet Take 12.5 mg by mouth daily.     Marland Kitchen losartan (COZAAR) 50 MG tablet Take 50 mg daily by mouth.    . Melatonin 5 MG TABS Take by mouth.    . metoprolol tartrate (LOPRESSOR) 25 MG tablet Take 25 mg daily by mouth.    Marland Kitchen  Omega-3 Fatty Acids (FISH OIL) 1000 MG CAPS Fish Oil     No current facility-administered medications on file prior to visit.    No Known Allergies Social History   Socioeconomic History  . Marital status: Married    Spouse name: Not on file  . Number of children: 2  . Years of education: 48  . Highest education level: Not on file  Occupational History  . 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 Financial resource strain: Not on file  . Food insecurity:    Worry: Not on file    Inability: Not on file  . Transportation needs:    Medical: Not on file    Non-medical: Not on file  Tobacco Use  .  Smoking status: Never Smoker  . Smokeless tobacco: Never Used  Substance and Sexual Activity  . Alcohol use: Yes    Alcohol/week: 3.0 oz    Types: 5 Glasses of wine per week  . Drug use: No  . Sexual activity: Not on file  Lifestyle  . Physical activity:    Days per week: 5 days    Minutes per session: 40 min  . Stress: Not on file  Relationships  . Social connections:    Talks on phone: Not on file    Gets together: Not on file    Attends religious service: Not on file    Active member of club or organization: Not on file    Attends meetings of clubs or organizations: Not on file    Relationship status: Not on file  . Intimate partner violence:    Fear of current or ex partner: Not on file    Emotionally abused: Not on file    Physically abused: Not on file    Forced sexual activity: Not on file  Other Topics Concern  . Not on file  Social History Narrative   PFT---08-20-2009   fev1-2.04, 92%, fev1/fvc 0.79   Widowed, remarried.  2 kids   Works part time Naval architect    Family History  Problem Relation Age of Onset  . Hypertension Brother       Review of Systems  All other systems reviewed and are negative.      Objective:   Physical Exam  Constitutional: She appears well-developed. No distress.  HENT:  Head: Normocephalic and atraumatic.  Right Ear: External ear normal.  Left Ear: External ear normal.  Nose: Nose normal.  Mouth/Throat: Oropharynx is clear and moist. No oropharyngeal exudate.  Eyes: Pupils are equal, round, and reactive to light. Conjunctivae and EOM are normal. Right eye exhibits no discharge. Left eye exhibits no discharge. No scleral icterus.  Neck: Normal range of motion. No JVD present. No tracheal deviation present. No thyromegaly present.  Cardiovascular: Normal rate, regular rhythm and normal heart sounds. Exam reveals no gallop and no friction rub.  No murmur heard. Pulmonary/Chest: Effort normal and breath  sounds normal. No stridor. No respiratory distress. She has no wheezes. She has no rales. She exhibits no tenderness.  Abdominal: Soft. Bowel sounds are normal. She exhibits no distension and no mass. There is no tenderness. There is no rebound and no guarding. No hernia.  Musculoskeletal: She exhibits no edema.       Lumbar back: She exhibits normal range of motion, no tenderness, no pain and no spasm.       Right hand: She exhibits decreased range of motion, tenderness, bony tenderness and deformity.  Left hand: She exhibits decreased range of motion, tenderness, bony tenderness and deformity.  Lymphadenopathy:    She has no cervical adenopathy.  Skin: She is not diaphoretic.  Vitals reviewed.         Assessment & Plan:  Encounter to establish care with new doctor  Essential hypertension  Primary osteoarthritis of both hands  Right sided sciatica  Patient is due for a complete physical exam in January.  She believes her cancer screening is up-to-date.  Her immunizations are up-to-date.  She believes she has had Prevnar 13 however we will verify this at her physical exam in January.  She declines the shingles vaccine.  She believes her colonoscopy and Pap smear up-to-date along with her mammogram.  She will schedule her mammogram.  This is due in June.  Will start on diclofenac 75 mg p.o. twice daily for the osteoarthritis in her hands.  This perhaps will help with what sounds like right-sided sciatica in her right leg.  Obtain fasting lab work in January.  We will review her DEXA scan results with her when she brings this with her in January.  Addendum 07/04/17  Received this message yesterday: Patient has requested a Home Health referral about 2 weeks ago with [place] after having a fall on vacation and fracturing her ankle. [place] then came back to [facility] to see Dr. Doran Durand at [facility] to have her surgery. Patient is requesting  Home Health to help with her ADL's and with PT as she will be in a boot for 1 full year and is having difficulty with ambulation therefore she is unable to bath, and etc. We have tried making the referral but need notation from PCP stating that she can have this referral. Spoke with [facility] that has access to our EMR and was told that they don't need an office visit just notation somewhere in the chart where you notate that she may have these services.   I am happy to consult home health.  Patient requires assistance with ADL's after fall and sustaining fracture.

## 2017-06-09 ENCOUNTER — Other Ambulatory Visit: Payer: Self-pay

## 2017-06-09 ENCOUNTER — Ambulatory Visit (INDEPENDENT_AMBULATORY_CARE_PROVIDER_SITE_OTHER): Payer: Medicare HMO | Admitting: Family Medicine

## 2017-06-09 ENCOUNTER — Telehealth: Payer: Self-pay | Admitting: Family Medicine

## 2017-06-09 ENCOUNTER — Encounter: Payer: Self-pay | Admitting: Family Medicine

## 2017-06-09 ENCOUNTER — Ambulatory Visit
Admission: RE | Admit: 2017-06-09 | Discharge: 2017-06-09 | Disposition: A | Discharge status: Home / Self Care | Payer: Medicare HMO | Source: Ambulatory Visit | Attending: Family Medicine | Admitting: Family Medicine

## 2017-06-09 VITALS — BP 124/80 | HR 74 | Temp 98.5°F | Resp 16 | Ht 64.5 in | Wt 183.2 lb

## 2017-06-09 DIAGNOSIS — M25461 Effusion, right knee: Secondary | ICD-10-CM

## 2017-06-09 DIAGNOSIS — M25561 Pain in right knee: Secondary | ICD-10-CM | POA: Diagnosis not present

## 2017-06-09 NOTE — Telephone Encounter (Signed)
Patient left a message requesting that we call her next week on her cell with any results. She is going to be out of town.  CB# [phone]

## 2017-06-09 NOTE — Progress Notes (Signed)
Patient ID: Lynn Estes, female    DOB: 1945/10/02, 72 y.o.   MRN: [id number]  PCP: Susy Frizzle, MD  Chief Complaint  Patient presents with  . Leg Pain    Patient in today with pain to right leg. States pain comes and goes.    Subjective:   Lynn Estes is a 72 y.o. female, presents to clinic with CC of right knee pain that began 4 to 5 days ago, felt from her right knee and feels like it radiates down her leg.  Symptoms began after returning to town Monday, states her flight was canceled for 7 hours, they sat in the airport for an extended period of time and then flew home, she had bilateral lower extremity swelling that improved over 1 to 2 days with elevation and resting, but she did have pain in the right leg that has been nagging, aching, worse at night, even waking her up at night, rated 5/10 currently.   She has had some similar pain before in the past, particularly 3 weeks ago she was evaluated and had mentioned to her doctors low back pain, this had radiated somewhat down her legs, she is unsure if this is similar or not.  She feels like she is "dragging it" she has some associated muscle spasms, is limping.   She currently denies any swelling or asymmetry between her legs or knees, denies any numbness, tingling.  Does not have any history of DVTs or PEs, denies any shortness of breath or chest pain. No past injury to her right knee.  Patient Active Problem List   Diagnosis Date Noted  . Vasovagal syncope 11/17/2016  . Rapid palpitations 11/17/2016  . Abnormal EKG 11/17/2016  . Chest pain with low risk for cardiac etiology 11/17/2016  . Asthma, mild intermittent, well-controlled 02/02/2014  . Cough due to ACE inhibitor 02/02/2012  . Leg pain, left 11/15/2011  . Chronic insomnia 05/13/2011  . Throat tightness 04/15/2011  . Seasonal allergic rhinitis 03/02/2010  . Essential hypertension 08/18/2009  . Lung nodules 08/13/2009     Prior to Admission  medications   Medication Sig Start Date End Date Taking? Authorizing Provider  Cholecalciferol (VITAMIN D PO) Take by mouth.   Yes [provider]  Cyanocobalamin (VITAMIN B 12 PO) Take by mouth.   Yes [provider]  diclofenac (VOLTAREN) 75 MG EC tablet Take 1 tablet (75 mg total) by mouth 2 (two) times daily. 05/26/17  Yes Susy Frizzle, MD  hydrochlorothiazide 25 MG tablet Take 12.5 mg by mouth daily.    Yes [provider]  losartan (COZAAR) 50 MG tablet Take 50 mg daily by mouth.   Yes [provider]  Melatonin 5 MG TABS Take by mouth.   Yes [provider]  metoprolol tartrate (LOPRESSOR) 25 MG tablet Take 25 mg daily by mouth. 11/14/16  Yes [provider]  Omega-3 Fatty Acids (FISH OIL) 1000 MG CAPS Fish Oil   Yes [provider]  Fluocinolone Acetonide Scalp 0.01 % OIL Apply topically.    [provider]     No Known Allergies   Family History  Problem Relation Age of Onset  . Hypertension Brother      Social History   Socioeconomic History  . Marital status: Married    Spouse name: Not on file  . Number of children: 2  . Years of education: 61  . Highest education level: Not on file  Occupational History  . Occupation:  Insurance agent    Comment: Chief Executive Officer  Social Needs  . Financial resource strain: Not on file  . Food insecurity:    Worry: Not on file    Inability: Not on file  . Transportation needs:    Medical: Not on file    Non-medical: Not on file  Tobacco Use  . Smoking status: Never Smoker  . Smokeless tobacco: Never Used  Substance and Sexual Activity  . Alcohol use: Yes    Alcohol/week: 3.0 oz    Types: 5 Glasses of wine per week  . Drug use: No  . Sexual activity: Not on file  Lifestyle  . Physical activity:    Days per week: 5 days    Minutes per session: 40 min  . Stress: Not on file  Relationships  . Social connections:    Talks on phone: Not on  file    Gets together: Not on file    Attends religious service: Not on file    Active member of club or organization: Not on file    Attends meetings of clubs or organizations: Not on file    Relationship status: Not on file  . Intimate partner violence:    Fear of current or ex partner: Not on file    Emotionally abused: Not on file    Physically abused: Not on file    Forced sexual activity: Not on file  Other Topics Concern  . Not on file  Social History Narrative   PFT---08-20-2009   fev1-2.04, 92%, fev1/fvc 0.79   Widowed, remarried.  2 kids   Works part time [employer] architect      Review of Systems  Constitutional: Negative.  Negative for activity change, appetite change, chills, diaphoresis, fatigue and fever.  HENT: Negative.   Eyes: Negative.   Respiratory: Negative.  Negative for chest tightness and shortness of breath.   Cardiovascular: Negative.  Negative for chest pain, palpitations and leg swelling.  Gastrointestinal: Negative.   Endocrine: Negative.   Genitourinary: Negative.   Musculoskeletal: Positive for arthralgias, back pain, gait problem and joint swelling. Negative for myalgias, neck pain and neck stiffness.  Skin: Negative.  Negative for color change.  Allergic/Immunologic: Negative.   Neurological: Negative for dizziness, weakness, light-headedness, numbness and headaches.  Hematological: Negative.  Negative for adenopathy. Does not bruise/bleed easily.  Psychiatric/Behavioral: Negative.   All other systems reviewed and are negative.      Objective:    Vitals:   06/09/17 1011  BP: 124/80  Pulse: 74  Resp: 16  Temp: 98.5 F (36.9 C)  TempSrc: Oral  SpO2: 96%  Weight: 183 lb 4 oz (83.1 kg)  Height: 5' 4.5" (1.638 m)      Physical Exam  Constitutional: She is oriented to person, place, and time. She appears well-developed and well-nourished.  Non-toxic appearance. No distress.  HENT:  Head: Normocephalic and atraumatic.    Right Ear: External ear normal.  Left Ear: External ear normal.  Nose: Nose normal.  Mouth/Throat: Uvula is midline, oropharynx is clear and moist and mucous membranes are normal.  Eyes: Pupils are equal, round, and reactive to light. Conjunctivae, EOM and lids are normal.  Neck: Normal range of motion and phonation normal. Neck supple. No tracheal deviation present.  Cardiovascular: Normal rate, regular rhythm, normal heart sounds and normal pulses. Exam reveals no gallop and no friction rub.  No murmur heard. Pulses:      Radial pulses are 2+ on the right side, and 2+  on the left side.       Posterior tibial pulses are 2+ on the right side, and 2+ on the left side.  Pulmonary/Chest: Effort normal and breath sounds normal. No stridor. No respiratory distress. She has no decreased breath sounds. She has no wheezes. She has no rhonchi. She has no rales. She exhibits no tenderness.  Abdominal: Soft. Normal appearance and bowel sounds are normal. She exhibits no distension. There is no tenderness. There is no rebound and no guarding.  Musculoskeletal: She exhibits tenderness. She exhibits no edema or deformity.       Right knee: She exhibits swelling (subtle), effusion (very small) and abnormal meniscus. She exhibits normal range of motion, no ecchymosis, no deformity, no laceration, no erythema, normal alignment, normal patellar mobility, no bony tenderness and no MCL laxity. Tenderness found. No medial joint line, no lateral joint line, no MCL, no LCL and no patellar tendon tenderness noted.  Right knee with tenderness to popliteal fossa, pain with meniscal testing, normal A and P ROM, antalgic gait, no erythema, edema, induration, warmth, no crepitus palpated  B/l calf circumerence measured and equal No b/l edema to shin, ankle, or foot No palpable cord to right popliteal fossa  Lymphadenopathy:    She has no cervical adenopathy.  Neurological: She is alert and oriented to person, place, and  time. She exhibits normal muscle tone. Gait normal.  Skin: Skin is warm, dry and intact. Capillary refill takes less than 2 seconds. No rash noted. She is not diaphoretic. No pallor.  Psychiatric: She has a normal mood and affect. Her speech is normal and behavior is normal.  Nursing note and vitals reviewed.         Assessment & Plan:      ICD-10-CM   1. Pain and swelling of right knee M25.561 DG Knee Complete 4 Views Right   M25.461     Suspect meniscal injury or osteoarthritis, patient has tenderness to palpation to popliteal fossa and pain elicited with meniscal testing, very mild right knee edema/effusion, she has good range of motion and no instability however she has worsening pain with ambulation or bearing weight.    Plan to get x-ray, patient to use compression knee brace, prescription given for this, also ice, elevation and rest.  He does see an orthopedist already,, patient will contact their office to see if she can follow-up with him for this or if not will return to our office in 1 to 2 weeks if not improving.   Wells' DVT  Criteria: -1 points, low risk for DVT  Pt understands with right leg and knee pain and with recent travel that she should go to the ER should she develop any right lower extremity edema or asymmetry.    Delsa Grana, PA-C 06/09/17 10:46 AM

## 2017-06-09 NOTE — Progress Notes (Signed)
Xray is negative - normal joint space and alignment, no xray findings of bone injury or arthritic changes in knee.  Treatment plan remains the same as discussed to keep a brace on the knee with some compression and support, elevate rest and ice, follow-up in 2 weeks if it is not doing much better, can come see [place] sooner as needed if it is worsening.  If she has any unilateral lower extremity swelling or asymmetry she does need to go to the ER for evaluation (as discussed regarding DVT, she was low risk per her H&P today).

## 2017-06-09 NOTE — Patient Instructions (Addendum)
Suspect arthritis or meniscal injury as the most suspicious cause of your knee pain.  Go get the Xray at [facility] on [address] your right knee and leg, apply ice and elevate it at night, get a brace either a neoprene compression sleeve or a knee brace with Velcro straps to help stabilize her knee, compress it and help rest it.     In the back of her mind we will not forget about DVT as a possible diagnosis, but right now with your exam, medical history you are very low risk for this.  If at any point you have swelling in your right leg causing it to become asymmetrical and enlarged compared to the left, then he need to go to the ER for evaluation, local ERs like [facility] and [facility] [place] [place] have vascular lab hours every day usually between 8 AM and 7 PM.  If your symptoms are worse and it is a normal weekday you could call us or come in to clinic for another evaluation and we can try and get it done outpatient.   How to Use a Knee Brace A knee brace is a device that you wear to support your knee, especially if the knee is healing after an injury or surgery. There are several types of knee braces. Some are designed to prevent an injury (prophylactic brace). These are often worn during sports. Others support an injured knee (functional brace) or keep it still while it heals (rehabilitative brace). People with severe arthritis of the knee may benefit from a brace that takes some pressure off the knee (unloader brace). Most knee braces are made from a combination of cloth and metal or plastic. You may need to wear a knee brace to:  Relieve knee pain.  Help your knee support your weight (improve stability).  Help you walk farther (improve mobility).  Prevent injury.  Support your knee while it heals from surgery or from an injury.  What are the risks? Generally, knee braces are very safe to wear. However, problems may occur, including:  Skin irritation that may lead to  infection.  Making your condition worse if you wear the brace in the wrong way.  How to use a knee brace Different braces will have different instructions for use. Your health care provider will tell you or show you:  How to put on your brace.  How to adjust the brace.  When and how often to wear the brace.  How to remove the brace.  If you will need any assistive devices in addition to the brace, such as crutches or a cane.  In general, your brace should:  Have the hinge of the brace line up with the bend of your knee.  Have straps, hooks, or tapes that fasten snugly around your leg.  Not feel too tight or too loose.  How to care for a knee brace  Check your brace often for signs of damage, such as loose connections or attachments. Your knee brace may get damaged or wear out during normal use.  Wash the fabric parts of your brace with soap and water.  Read the insert that comes with your brace for other specific care instructions. Contact a health care provider if:  Your knee brace is too loose or too tight and you cannot adjust it.  Your knee brace causes skin redness, swelling, bruising, or irritation.  Your knee brace is not helping.  Your knee brace is making your knee pain worse.  This information is not intended to replace advice given to you by your health care provider. Make sure you discuss any questions you have with your health care provider. Document Released: 03/12/2003 Document Revised: 05/28/2015 Document Reviewed: 04/14/2014 Elsevier Interactive Patient Education  2018 [place] for Routine Care of Injuries Many injuries can be cared for using rest, ice, compression, and elevation (RICE therapy). Using RICE therapy can help to lessen pain and swelling. It can help your body to heal. Rest Reduce your normal activities and avoid using the injured part of your body. You can go back to your normal activities when you feel okay and your doctor  says it is okay. Ice Do not put ice on your bare skin.  Put ice in a plastic bag.  Place a towel between your skin and the bag.  Leave the ice on for 20 minutes, 2-3 times a day.  Do this for as long as told by your doctor. Compression Compression means putting pressure on the injured area. This can be done with an elastic bandage. If an elastic bandage has been applied:  Remove and reapply the bandage every 3-4 hours or as told by your doctor.  Make sure the bandage is not wrapped too tight. Wrap the bandage more loosely if part of your body beyond the bandage is blue, swollen, cold, painful, or loses feeling (numb).  See your doctor if the bandage seems to make your problems worse.  Elevation Elevation means keeping the injured area raised. Raise the injured area above your heart or the center of your chest if you can. When should I get help? You should get help if:  You keep having pain and swelling.  Your symptoms get worse.  Get help right away if: You should get help right away if:  You have sudden bad pain at or below the area of your injury.  You have redness or more swelling around your injury.  You have tingling or numbness at or below the injury that does not go away when you take off the bandage.  This information is not intended to replace advice given to you by your health care provider. Make sure you discuss any questions you have with your health care provider. Document Released: 06/08/2007 Document Revised: 11/17/2015 Document Reviewed: 11/27/2013 Elsevier Interactive Patient Education  2017 [employer].

## 2017-06-16 DIAGNOSIS — S82852A Displaced trimalleolar fracture of left lower leg, initial encounter for closed fracture: Secondary | ICD-10-CM | POA: Diagnosis not present

## 2017-06-16 DIAGNOSIS — S99919A Unspecified injury of unspecified ankle, initial encounter: Secondary | ICD-10-CM | POA: Diagnosis not present

## 2017-06-16 DIAGNOSIS — S82842A Displaced bimalleolar fracture of left lower leg, initial encounter for closed fracture: Secondary | ICD-10-CM | POA: Diagnosis not present

## 2017-06-16 DIAGNOSIS — W010XXA Fall on same level from slipping, tripping and stumbling without subsequent striking against object, initial encounter: Secondary | ICD-10-CM | POA: Diagnosis not present

## 2017-06-16 DIAGNOSIS — S82852D Displaced trimalleolar fracture of left lower leg, subsequent encounter for closed fracture with routine healing: Secondary | ICD-10-CM | POA: Diagnosis not present

## 2017-06-16 DIAGNOSIS — S8992XA Unspecified injury of left lower leg, initial encounter: Secondary | ICD-10-CM | POA: Diagnosis not present

## 2017-06-19 DIAGNOSIS — S82852A Displaced trimalleolar fracture of left lower leg, initial encounter for closed fracture: Secondary | ICD-10-CM | POA: Diagnosis not present

## 2017-06-20 DIAGNOSIS — Y999 Unspecified external cause status: Secondary | ICD-10-CM | POA: Diagnosis not present

## 2017-06-20 DIAGNOSIS — G8918 Other acute postprocedural pain: Secondary | ICD-10-CM | POA: Diagnosis not present

## 2017-06-20 DIAGNOSIS — S82852A Displaced trimalleolar fracture of left lower leg, initial encounter for closed fracture: Secondary | ICD-10-CM | POA: Diagnosis not present

## 2017-06-21 ENCOUNTER — Other Ambulatory Visit: Payer: Self-pay | Admitting: Family Medicine

## 2017-06-21 MED ORDER — METOPROLOL TARTRATE 25 MG PO TABS: 25.0000 mg | ORAL_TABLET | Freq: Every day | ORAL | 3 refills | Status: DC

## 2017-06-27 ENCOUNTER — Other Ambulatory Visit: Payer: Self-pay | Admitting: Family Medicine

## 2017-06-27 DIAGNOSIS — M259 Joint disorder, unspecified: Secondary | ICD-10-CM

## 2017-06-29 ENCOUNTER — Other Ambulatory Visit: Payer: Self-pay | Admitting: Family Medicine

## 2017-06-29 DIAGNOSIS — R2681 Unsteadiness on feet: Secondary | ICD-10-CM

## 2017-07-03 ENCOUNTER — Telehealth: Payer: Self-pay | Admitting: Family Medicine

## 2017-07-03 NOTE — Telephone Encounter (Signed)
Patient has requested a Home Health referral about 2 weeks ago with [place] after having a fall on vacation and fracturing her ankle. [place] then came back to [facility] to see Dr. Doran Durand at [facility] to have her surgery. Patient is requesting Home Health to help with her ADL's and with PT as she will be in a boot for 1 full year and is having difficulty with ambulation therefore she is unable to bath, and etc. We have tried making the referral but need notation from PCP stating that she can have this referral. Spoke with [facility] that has access to our EMR and was told that they don't need an office visit just notation somewhere in the chart where you notate that she may have these services.

## 2017-07-04 DIAGNOSIS — S82852D Displaced trimalleolar fracture of left lower leg, subsequent encounter for closed fracture with routine healing: Secondary | ICD-10-CM | POA: Diagnosis not present

## 2017-07-04 DIAGNOSIS — R2681 Unsteadiness on feet: Secondary | ICD-10-CM | POA: Diagnosis not present

## 2017-07-04 NOTE — Telephone Encounter (Signed)
Referral for Home Health currently being processed.

## 2017-07-04 NOTE — Telephone Encounter (Signed)
I have addended my last office visit.  See A/P area.  This should suffice. Please schedule home health consult for assistance with ADL's

## 2017-07-05 DIAGNOSIS — Z4789 Encounter for other orthopedic aftercare: Secondary | ICD-10-CM | POA: Diagnosis not present

## 2017-07-05 DIAGNOSIS — S82852A Displaced trimalleolar fracture of left lower leg, initial encounter for closed fracture: Secondary | ICD-10-CM | POA: Diagnosis not present

## 2017-07-07 DIAGNOSIS — R2681 Unsteadiness on feet: Secondary | ICD-10-CM | POA: Diagnosis not present

## 2017-07-07 DIAGNOSIS — S82852D Displaced trimalleolar fracture of left lower leg, subsequent encounter for closed fracture with routine healing: Secondary | ICD-10-CM | POA: Diagnosis not present

## 2017-07-10 DIAGNOSIS — S82852D Displaced trimalleolar fracture of left lower leg, subsequent encounter for closed fracture with routine healing: Secondary | ICD-10-CM | POA: Diagnosis not present

## 2017-07-10 DIAGNOSIS — R2681 Unsteadiness on feet: Secondary | ICD-10-CM | POA: Diagnosis not present

## 2017-07-10 NOTE — Telephone Encounter (Signed)
Spoke with the patient and was informed that [facility] is in the home doing her home health. Patient states that they have been very good to her and all things are going well with her care.

## 2017-07-11 DIAGNOSIS — S82852D Displaced trimalleolar fracture of left lower leg, subsequent encounter for closed fracture with routine healing: Secondary | ICD-10-CM | POA: Diagnosis not present

## 2017-07-11 DIAGNOSIS — R2681 Unsteadiness on feet: Secondary | ICD-10-CM | POA: Diagnosis not present

## 2017-07-12 DIAGNOSIS — S82852D Displaced trimalleolar fracture of left lower leg, subsequent encounter for closed fracture with routine healing: Secondary | ICD-10-CM | POA: Diagnosis not present

## 2017-07-12 DIAGNOSIS — R2681 Unsteadiness on feet: Secondary | ICD-10-CM | POA: Diagnosis not present

## 2017-07-13 DIAGNOSIS — R2681 Unsteadiness on feet: Secondary | ICD-10-CM | POA: Diagnosis not present

## 2017-07-13 DIAGNOSIS — S82852D Displaced trimalleolar fracture of left lower leg, subsequent encounter for closed fracture with routine healing: Secondary | ICD-10-CM | POA: Diagnosis not present

## 2017-07-14 DIAGNOSIS — S82852D Displaced trimalleolar fracture of left lower leg, subsequent encounter for closed fracture with routine healing: Secondary | ICD-10-CM | POA: Diagnosis not present

## 2017-07-14 DIAGNOSIS — R2681 Unsteadiness on feet: Secondary | ICD-10-CM | POA: Diagnosis not present

## 2017-07-17 DIAGNOSIS — R2681 Unsteadiness on feet: Secondary | ICD-10-CM | POA: Diagnosis not present

## 2017-07-17 DIAGNOSIS — S82852D Displaced trimalleolar fracture of left lower leg, subsequent encounter for closed fracture with routine healing: Secondary | ICD-10-CM | POA: Diagnosis not present

## 2017-07-19 DIAGNOSIS — R2681 Unsteadiness on feet: Secondary | ICD-10-CM | POA: Diagnosis not present

## 2017-07-19 DIAGNOSIS — S82852D Displaced trimalleolar fracture of left lower leg, subsequent encounter for closed fracture with routine healing: Secondary | ICD-10-CM | POA: Diagnosis not present

## 2017-07-20 DIAGNOSIS — S82852D Displaced trimalleolar fracture of left lower leg, subsequent encounter for closed fracture with routine healing: Secondary | ICD-10-CM | POA: Diagnosis not present

## 2017-07-20 DIAGNOSIS — R2681 Unsteadiness on feet: Secondary | ICD-10-CM | POA: Diagnosis not present

## 2017-07-24 DIAGNOSIS — S82852D Displaced trimalleolar fracture of left lower leg, subsequent encounter for closed fracture with routine healing: Secondary | ICD-10-CM | POA: Diagnosis not present

## 2017-07-24 DIAGNOSIS — R2681 Unsteadiness on feet: Secondary | ICD-10-CM | POA: Diagnosis not present

## 2017-07-27 DIAGNOSIS — S82852D Displaced trimalleolar fracture of left lower leg, subsequent encounter for closed fracture with routine healing: Secondary | ICD-10-CM | POA: Diagnosis not present

## 2017-07-27 DIAGNOSIS — R2681 Unsteadiness on feet: Secondary | ICD-10-CM | POA: Diagnosis not present

## 2017-07-31 DIAGNOSIS — S82852D Displaced trimalleolar fracture of left lower leg, subsequent encounter for closed fracture with routine healing: Secondary | ICD-10-CM | POA: Diagnosis not present

## 2017-07-31 DIAGNOSIS — R2681 Unsteadiness on feet: Secondary | ICD-10-CM | POA: Diagnosis not present

## 2017-08-01 DIAGNOSIS — R2681 Unsteadiness on feet: Secondary | ICD-10-CM | POA: Diagnosis not present

## 2017-08-01 DIAGNOSIS — S82852D Displaced trimalleolar fracture of left lower leg, subsequent encounter for closed fracture with routine healing: Secondary | ICD-10-CM | POA: Diagnosis not present

## 2017-08-02 DIAGNOSIS — R2681 Unsteadiness on feet: Secondary | ICD-10-CM | POA: Diagnosis not present

## 2017-08-02 DIAGNOSIS — S82852D Displaced trimalleolar fracture of left lower leg, subsequent encounter for closed fracture with routine healing: Secondary | ICD-10-CM | POA: Diagnosis not present

## 2017-08-03 DIAGNOSIS — S82852D Displaced trimalleolar fracture of left lower leg, subsequent encounter for closed fracture with routine healing: Secondary | ICD-10-CM | POA: Diagnosis not present

## 2017-08-03 DIAGNOSIS — Z4789 Encounter for other orthopedic aftercare: Secondary | ICD-10-CM | POA: Diagnosis not present

## 2017-08-03 DIAGNOSIS — M25572 Pain in left ankle and joints of left foot: Secondary | ICD-10-CM | POA: Diagnosis not present

## 2017-08-04 DIAGNOSIS — S82852D Displaced trimalleolar fracture of left lower leg, subsequent encounter for closed fracture with routine healing: Secondary | ICD-10-CM | POA: Diagnosis not present

## 2017-08-04 DIAGNOSIS — R2681 Unsteadiness on feet: Secondary | ICD-10-CM | POA: Diagnosis not present

## 2017-08-08 DIAGNOSIS — S82852D Displaced trimalleolar fracture of left lower leg, subsequent encounter for closed fracture with routine healing: Secondary | ICD-10-CM | POA: Diagnosis not present

## 2017-08-08 DIAGNOSIS — R2681 Unsteadiness on feet: Secondary | ICD-10-CM | POA: Diagnosis not present

## 2017-08-09 DIAGNOSIS — R2681 Unsteadiness on feet: Secondary | ICD-10-CM | POA: Diagnosis not present

## 2017-08-09 DIAGNOSIS — S82852D Displaced trimalleolar fracture of left lower leg, subsequent encounter for closed fracture with routine healing: Secondary | ICD-10-CM | POA: Diagnosis not present

## 2017-08-10 DIAGNOSIS — S82852D Displaced trimalleolar fracture of left lower leg, subsequent encounter for closed fracture with routine healing: Secondary | ICD-10-CM | POA: Diagnosis not present

## 2017-08-10 DIAGNOSIS — R2681 Unsteadiness on feet: Secondary | ICD-10-CM | POA: Diagnosis not present

## 2017-08-11 DIAGNOSIS — M25572 Pain in left ankle and joints of left foot: Secondary | ICD-10-CM | POA: Diagnosis not present

## 2017-08-14 DIAGNOSIS — R2681 Unsteadiness on feet: Secondary | ICD-10-CM | POA: Diagnosis not present

## 2017-08-14 DIAGNOSIS — S82852D Displaced trimalleolar fracture of left lower leg, subsequent encounter for closed fracture with routine healing: Secondary | ICD-10-CM | POA: Diagnosis not present

## 2017-08-15 DIAGNOSIS — R2681 Unsteadiness on feet: Secondary | ICD-10-CM | POA: Diagnosis not present

## 2017-08-15 DIAGNOSIS — S82852D Displaced trimalleolar fracture of left lower leg, subsequent encounter for closed fracture with routine healing: Secondary | ICD-10-CM | POA: Diagnosis not present

## 2017-08-17 DIAGNOSIS — S82852D Displaced trimalleolar fracture of left lower leg, subsequent encounter for closed fracture with routine healing: Secondary | ICD-10-CM | POA: Diagnosis not present

## 2017-08-17 DIAGNOSIS — R2681 Unsteadiness on feet: Secondary | ICD-10-CM | POA: Diagnosis not present

## 2017-08-18 DIAGNOSIS — R2681 Unsteadiness on feet: Secondary | ICD-10-CM | POA: Diagnosis not present

## 2017-08-18 DIAGNOSIS — S82852D Displaced trimalleolar fracture of left lower leg, subsequent encounter for closed fracture with routine healing: Secondary | ICD-10-CM | POA: Diagnosis not present

## 2017-08-21 DIAGNOSIS — R2681 Unsteadiness on feet: Secondary | ICD-10-CM | POA: Diagnosis not present

## 2017-08-21 DIAGNOSIS — S82852D Displaced trimalleolar fracture of left lower leg, subsequent encounter for closed fracture with routine healing: Secondary | ICD-10-CM | POA: Diagnosis not present

## 2017-08-22 DIAGNOSIS — S82852D Displaced trimalleolar fracture of left lower leg, subsequent encounter for closed fracture with routine healing: Secondary | ICD-10-CM | POA: Diagnosis not present

## 2017-08-22 DIAGNOSIS — R2681 Unsteadiness on feet: Secondary | ICD-10-CM | POA: Diagnosis not present

## 2017-08-24 DIAGNOSIS — S82852D Displaced trimalleolar fracture of left lower leg, subsequent encounter for closed fracture with routine healing: Secondary | ICD-10-CM | POA: Diagnosis not present

## 2017-08-24 DIAGNOSIS — R2681 Unsteadiness on feet: Secondary | ICD-10-CM | POA: Diagnosis not present

## 2017-08-28 DIAGNOSIS — S82852D Displaced trimalleolar fracture of left lower leg, subsequent encounter for closed fracture with routine healing: Secondary | ICD-10-CM | POA: Diagnosis not present

## 2017-08-28 DIAGNOSIS — R2681 Unsteadiness on feet: Secondary | ICD-10-CM | POA: Diagnosis not present

## 2017-08-29 DIAGNOSIS — S82852D Displaced trimalleolar fracture of left lower leg, subsequent encounter for closed fracture with routine healing: Secondary | ICD-10-CM | POA: Diagnosis not present

## 2017-08-29 DIAGNOSIS — R2681 Unsteadiness on feet: Secondary | ICD-10-CM | POA: Diagnosis not present

## 2017-08-30 ENCOUNTER — Encounter: Payer: Self-pay | Admitting: *Deleted

## 2017-08-31 DIAGNOSIS — S82852D Displaced trimalleolar fracture of left lower leg, subsequent encounter for closed fracture with routine healing: Secondary | ICD-10-CM | POA: Diagnosis not present

## 2017-08-31 DIAGNOSIS — R2681 Unsteadiness on feet: Secondary | ICD-10-CM | POA: Diagnosis not present

## 2017-09-01 DIAGNOSIS — S82852D Displaced trimalleolar fracture of left lower leg, subsequent encounter for closed fracture with routine healing: Secondary | ICD-10-CM | POA: Diagnosis not present

## 2017-09-01 DIAGNOSIS — M25572 Pain in left ankle and joints of left foot: Secondary | ICD-10-CM | POA: Diagnosis not present

## 2017-09-02 DIAGNOSIS — R2681 Unsteadiness on feet: Secondary | ICD-10-CM | POA: Diagnosis not present

## 2017-09-02 DIAGNOSIS — S82852D Displaced trimalleolar fracture of left lower leg, subsequent encounter for closed fracture with routine healing: Secondary | ICD-10-CM | POA: Diagnosis not present

## 2017-09-05 DIAGNOSIS — R2681 Unsteadiness on feet: Secondary | ICD-10-CM | POA: Diagnosis not present

## 2017-09-05 DIAGNOSIS — S82852D Displaced trimalleolar fracture of left lower leg, subsequent encounter for closed fracture with routine healing: Secondary | ICD-10-CM | POA: Diagnosis not present

## 2017-09-07 DIAGNOSIS — R2681 Unsteadiness on feet: Secondary | ICD-10-CM | POA: Diagnosis not present

## 2017-09-07 DIAGNOSIS — S82852D Displaced trimalleolar fracture of left lower leg, subsequent encounter for closed fracture with routine healing: Secondary | ICD-10-CM | POA: Diagnosis not present

## 2017-09-08 DIAGNOSIS — S82852D Displaced trimalleolar fracture of left lower leg, subsequent encounter for closed fracture with routine healing: Secondary | ICD-10-CM | POA: Diagnosis not present

## 2017-09-08 DIAGNOSIS — R2681 Unsteadiness on feet: Secondary | ICD-10-CM | POA: Diagnosis not present

## 2017-09-11 DIAGNOSIS — R2681 Unsteadiness on feet: Secondary | ICD-10-CM | POA: Diagnosis not present

## 2017-09-11 DIAGNOSIS — S82852D Displaced trimalleolar fracture of left lower leg, subsequent encounter for closed fracture with routine healing: Secondary | ICD-10-CM | POA: Diagnosis not present

## 2017-09-12 DIAGNOSIS — R2681 Unsteadiness on feet: Secondary | ICD-10-CM | POA: Diagnosis not present

## 2017-09-12 DIAGNOSIS — S82852D Displaced trimalleolar fracture of left lower leg, subsequent encounter for closed fracture with routine healing: Secondary | ICD-10-CM | POA: Diagnosis not present

## 2017-09-13 DIAGNOSIS — R2681 Unsteadiness on feet: Secondary | ICD-10-CM | POA: Diagnosis not present

## 2017-09-13 DIAGNOSIS — S82852D Displaced trimalleolar fracture of left lower leg, subsequent encounter for closed fracture with routine healing: Secondary | ICD-10-CM | POA: Diagnosis not present

## 2017-09-14 DIAGNOSIS — S82852D Displaced trimalleolar fracture of left lower leg, subsequent encounter for closed fracture with routine healing: Secondary | ICD-10-CM | POA: Diagnosis not present

## 2017-09-14 DIAGNOSIS — R2681 Unsteadiness on feet: Secondary | ICD-10-CM | POA: Diagnosis not present

## 2017-09-18 DIAGNOSIS — S82852D Displaced trimalleolar fracture of left lower leg, subsequent encounter for closed fracture with routine healing: Secondary | ICD-10-CM | POA: Diagnosis not present

## 2017-09-18 DIAGNOSIS — R2681 Unsteadiness on feet: Secondary | ICD-10-CM | POA: Diagnosis not present

## 2017-09-19 DIAGNOSIS — R2681 Unsteadiness on feet: Secondary | ICD-10-CM | POA: Diagnosis not present

## 2017-09-19 DIAGNOSIS — S82852D Displaced trimalleolar fracture of left lower leg, subsequent encounter for closed fracture with routine healing: Secondary | ICD-10-CM | POA: Diagnosis not present

## 2017-09-20 DIAGNOSIS — R2681 Unsteadiness on feet: Secondary | ICD-10-CM | POA: Diagnosis not present

## 2017-09-20 DIAGNOSIS — S82852D Displaced trimalleolar fracture of left lower leg, subsequent encounter for closed fracture with routine healing: Secondary | ICD-10-CM | POA: Diagnosis not present

## 2017-09-22 DIAGNOSIS — R2681 Unsteadiness on feet: Secondary | ICD-10-CM | POA: Diagnosis not present

## 2017-09-22 DIAGNOSIS — S82852D Displaced trimalleolar fracture of left lower leg, subsequent encounter for closed fracture with routine healing: Secondary | ICD-10-CM | POA: Diagnosis not present

## 2017-09-26 DIAGNOSIS — Z961 Presence of intraocular lens: Secondary | ICD-10-CM | POA: Diagnosis not present

## 2017-09-26 DIAGNOSIS — S82852D Displaced trimalleolar fracture of left lower leg, subsequent encounter for closed fracture with routine healing: Secondary | ICD-10-CM | POA: Diagnosis not present

## 2017-09-26 DIAGNOSIS — H11441 Conjunctival cysts, right eye: Secondary | ICD-10-CM | POA: Diagnosis not present

## 2017-09-26 DIAGNOSIS — R2681 Unsteadiness on feet: Secondary | ICD-10-CM | POA: Diagnosis not present

## 2017-09-26 DIAGNOSIS — H2511 Age-related nuclear cataract, right eye: Secondary | ICD-10-CM | POA: Diagnosis not present

## 2017-09-27 DIAGNOSIS — S82852D Displaced trimalleolar fracture of left lower leg, subsequent encounter for closed fracture with routine healing: Secondary | ICD-10-CM | POA: Diagnosis not present

## 2017-09-28 DIAGNOSIS — R2681 Unsteadiness on feet: Secondary | ICD-10-CM | POA: Diagnosis not present

## 2017-09-28 DIAGNOSIS — H61001 Unspecified perichondritis of right external ear: Secondary | ICD-10-CM | POA: Diagnosis not present

## 2017-09-28 DIAGNOSIS — S82852D Displaced trimalleolar fracture of left lower leg, subsequent encounter for closed fracture with routine healing: Secondary | ICD-10-CM | POA: Diagnosis not present

## 2017-10-03 DIAGNOSIS — M25572 Pain in left ankle and joints of left foot: Secondary | ICD-10-CM | POA: Diagnosis not present

## 2017-10-05 DIAGNOSIS — M25572 Pain in left ankle and joints of left foot: Secondary | ICD-10-CM | POA: Diagnosis not present

## 2017-10-09 DIAGNOSIS — M25572 Pain in left ankle and joints of left foot: Secondary | ICD-10-CM | POA: Diagnosis not present

## 2017-10-12 DIAGNOSIS — M25572 Pain in left ankle and joints of left foot: Secondary | ICD-10-CM | POA: Diagnosis not present

## 2017-10-20 DIAGNOSIS — M25572 Pain in left ankle and joints of left foot: Secondary | ICD-10-CM | POA: Diagnosis not present

## 2017-10-23 DIAGNOSIS — M25572 Pain in left ankle and joints of left foot: Secondary | ICD-10-CM | POA: Diagnosis not present

## 2017-10-26 DIAGNOSIS — M25572 Pain in left ankle and joints of left foot: Secondary | ICD-10-CM | POA: Diagnosis not present

## 2017-10-30 DIAGNOSIS — M25572 Pain in left ankle and joints of left foot: Secondary | ICD-10-CM | POA: Diagnosis not present

## 2017-11-02 DIAGNOSIS — M25572 Pain in left ankle and joints of left foot: Secondary | ICD-10-CM | POA: Diagnosis not present

## 2017-11-06 DIAGNOSIS — M25572 Pain in left ankle and joints of left foot: Secondary | ICD-10-CM | POA: Diagnosis not present

## 2017-11-09 DIAGNOSIS — M25572 Pain in left ankle and joints of left foot: Secondary | ICD-10-CM | POA: Diagnosis not present

## 2017-11-10 DIAGNOSIS — S82852D Displaced trimalleolar fracture of left lower leg, subsequent encounter for closed fracture with routine healing: Secondary | ICD-10-CM | POA: Diagnosis not present

## 2017-11-15 DIAGNOSIS — M25572 Pain in left ankle and joints of left foot: Secondary | ICD-10-CM | POA: Diagnosis not present

## 2017-11-16 DIAGNOSIS — Z1231 Encounter for screening mammogram for malignant neoplasm of breast: Secondary | ICD-10-CM | POA: Diagnosis not present

## 2017-11-16 LAB — HM MAMMOGRAPHY

## 2017-11-17 DIAGNOSIS — M25572 Pain in left ankle and joints of left foot: Secondary | ICD-10-CM | POA: Diagnosis not present

## 2017-11-18 ENCOUNTER — Encounter: Payer: Self-pay | Admitting: *Deleted

## 2017-11-20 DIAGNOSIS — M25572 Pain in left ankle and joints of left foot: Secondary | ICD-10-CM | POA: Diagnosis not present

## 2017-11-22 DIAGNOSIS — M25572 Pain in left ankle and joints of left foot: Secondary | ICD-10-CM | POA: Diagnosis not present

## 2017-11-28 DIAGNOSIS — M25572 Pain in left ankle and joints of left foot: Secondary | ICD-10-CM | POA: Diagnosis not present

## 2017-11-29 DIAGNOSIS — Z01419 Encounter for gynecological examination (general) (routine) without abnormal findings: Secondary | ICD-10-CM | POA: Diagnosis not present

## 2017-12-04 DIAGNOSIS — M25572 Pain in left ankle and joints of left foot: Secondary | ICD-10-CM | POA: Diagnosis not present

## 2017-12-05 ENCOUNTER — Ambulatory Visit (INDEPENDENT_AMBULATORY_CARE_PROVIDER_SITE_OTHER): Payer: Medicare HMO | Admitting: Family Medicine

## 2017-12-05 DIAGNOSIS — Z23 Encounter for immunization: Secondary | ICD-10-CM

## 2017-12-11 DIAGNOSIS — Z1279 Encounter for screening for malignant neoplasm of other genitourinary organs: Secondary | ICD-10-CM | POA: Diagnosis not present

## 2018-01-08 ENCOUNTER — Encounter: Payer: Self-pay | Admitting: Family Medicine

## 2018-01-08 ENCOUNTER — Ambulatory Visit (INDEPENDENT_AMBULATORY_CARE_PROVIDER_SITE_OTHER): Payer: Medicare HMO | Admitting: Family Medicine

## 2018-01-08 VITALS — BP 130/82 | HR 80 | Temp 98.6°F | Resp 16 | Ht 64.5 in | Wt 165.0 lb

## 2018-01-08 DIAGNOSIS — Z0000 Encounter for general adult medical examination without abnormal findings: Secondary | ICD-10-CM

## 2018-01-08 DIAGNOSIS — F5101 Primary insomnia: Secondary | ICD-10-CM | POA: Diagnosis not present

## 2018-01-08 DIAGNOSIS — R69 Illness, unspecified: Secondary | ICD-10-CM | POA: Diagnosis not present

## 2018-01-08 DIAGNOSIS — G4730 Sleep apnea, unspecified: Secondary | ICD-10-CM | POA: Diagnosis not present

## 2018-01-08 DIAGNOSIS — Z13220 Encounter for screening for lipoid disorders: Secondary | ICD-10-CM | POA: Diagnosis not present

## 2018-01-08 MED ORDER — ZOLPIDEM TARTRATE 10 MG PO TABS: 10.0000 mg | ORAL_TABLET | Freq: Every evening | ORAL | 1 refills | Status: DC | PRN

## 2018-01-08 NOTE — Progress Notes (Signed)
Subjective:    Patient ID: Lynn Estes, female    DOB: [birth year], 73 y.o.   MRN: [id number]  HPI Patient is a very pleasant 73 year old Caucasian female who is here for CPE. Mammogram was performed in 11/2017 and was normal.  She had a bone density test performed in 2018 that per her report was normal.  She believes her colonoscopy is up-to-date however I have no records to review at the present time.  She gets a Pap smear every 3 years performed by her gynecologist.  Her immunizations are listed below: Immunization History  Administered Date(s) Administered  . Influenza Split 11/10/2011, 10/03/2013  . Influenza, High Dose Seasonal PF 12/05/2017  . Influenza,inj,Quad PF,6+ Mos 11/22/2012  . Pneumococcal Polysaccharide-23 09/10/2011, 10/03/2012  . Tdap 08/29/2011   She believes she has also had Prevnar 13.  She declines a shingles vaccine.  She does report osteoarthritic changes in both hands.  She suffered a trimalleolar fracture of the left lower leg s/p ORIF by Dr. Doran Durand.  After her surgery, she was told that she quit breathing several times while in recovery.  She also snores loudly.  Her husband has heard her stop breathing at night.  She was told that they were concerned that she may have sleep apnea.  She does report feeling sleepy the majority of the time.  She can easily fall asleep throughout the day.  She never sleeps more than 1 or 2 hours straight each night.  She finds herself frequently awakening.  Often she will awaken gasping for air.  She is interested in being evaluated for sleep apnea.  She is also interested in something to help her sleep.  Is receiving physical therapy for the weakness in her left leg after her fracture due to prolonged deconditioning.  She believes she is due for colon cancer screening and is interested in Cologuard.  Her bone density test showed osteopenia in 2018 and is not due again until 2021.  The remainder of her preventative care is  up-to-date Past Medical History:  Diagnosis Date  . Asthma   . Cataract   . Diverticulitis   . History of shingles   . Hypertension   . Lung nodules    Nodules noted on CT scan July 2017  . OSA (obstructive sleep apnea)   . Partial small bowel obstruction [facility])    Past Surgical History:  Procedure Laterality Date  . CATARACT EXTRACTION W/PHACO Left 06/08/2015   Procedure: CATARACT EXTRACTION PHACO AND INTRAOCULAR LENS PLACEMENT LEFT EYE CDE=7.18;  Surgeon: Williams Che, MD;  Location: [facility];  Service: Ophthalmology;  Laterality: Left;  . DILATION AND CURETTAGE OF UTERUS     X 2  . EXERCISE TOLERANCE TEST (GXT)  11/2016   Low Exercise Tolerance - 6:24 min (dyspnea, fatigue), upsloping ST segment depression noted.  Nondiagnostic.  HR 150 bpm = 97% MPHR). Hypertensive response to exercise. NEGATIVE test.  No arrhythmias.  . right rotator cuff surgery    . TRANSTHORACIC ECHOCARDIOGRAM  11/2016   Normal LV size and function.  GR 1 DD.  Moderate concentric hypertrophy.  Vigorous LV function with EF of 65-70%.   Current Outpatient Medications on File Prior to Visit  Medication Sig Dispense Refill  . Cholecalciferol (VITAMIN D PO) Take by mouth.    . Cyanocobalamin (VITAMIN B 12 PO) Take by mouth.    . diclofenac (VOLTAREN) 75 MG EC tablet Take 1 tablet (75 mg total) by mouth 2 (two) times daily. 30 tablet  0  . Fluocinolone Acetonide Scalp 0.01 % OIL Apply topically.    . hydrochlorothiazide 25 MG tablet Take 12.5 mg by mouth daily.     Marland Kitchen losartan (COZAAR) 50 MG tablet Take 50 mg daily by mouth.    . Melatonin 5 MG TABS Take by mouth.    . metoprolol tartrate (LOPRESSOR) 25 MG tablet Take 1 tablet (25 mg total) by mouth daily. 90 tablet 3  . Omega-3 Fatty Acids (FISH OIL) 1000 MG CAPS Fish Oil     No current facility-administered medications on file prior to visit.    No Known Allergies Social History   Socioeconomic History  . Marital status: Married    Spouse name: Not on  file  . Number of children: 2  . Years of education: 24  . Highest education level: Not on file  Occupational History  . 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 Financial resource strain: Not on file  . Food insecurity:    Worry: Not on file    Inability: Not on file  . Transportation needs:    Medical: Not on file    Non-medical: Not on file  Tobacco Use  . Smoking status: Never Smoker  . Smokeless tobacco: Never Used  Substance and Sexual Activity  . Alcohol use: Yes    Alcohol/week: 5.0 standard drinks    Types: 5 Glasses of wine per week  . Drug use: No  . Sexual activity: Not on file  Lifestyle  . Physical activity:    Days per week: 5 days    Minutes per session: 40 min  . Stress: Not on file  Relationships  . Social connections:    Talks on phone: Not on file    Gets together: Not on file    Attends religious service: Not on file    Active member of club or organization: Not on file    Attends meetings of clubs or organizations: Not on file    Relationship status: Not on file  . Intimate partner violence:    Fear of current or ex partner: Not on file    Emotionally abused: Not on file    Physically abused: Not on file    Forced sexual activity: Not on file  Other Topics Concern  . Not on file  Social History Narrative   PFT---08-20-2009   fev1-2.04, 92%, fev1/fvc 0.79   Widowed, remarried.  2 kids   Works part time [employer] architect    Family History  Problem Relation Age of Onset  . Hypertension Brother       Review of Systems  All other systems reviewed and are negative.      Objective:   Physical Exam Vitals signs reviewed.  Constitutional:      General: She is not in acute distress.    Appearance: She is well-developed. She is not diaphoretic.  HENT:     Head: Normocephalic and atraumatic.     Right Ear: External ear normal.     Left Ear: External ear normal.     Nose: Nose normal.      Mouth/Throat:     Pharynx: No oropharyngeal exudate.  Eyes:     General: No scleral icterus.       Right eye: No discharge.        Left eye: No discharge.     Conjunctiva/sclera: Conjunctivae normal.     Pupils: Pupils are equal, round, and reactive to  light.  Neck:     Musculoskeletal: Normal range of motion.     Thyroid: No thyromegaly.     Vascular: No JVD.     Trachea: No tracheal deviation.  Cardiovascular:     Rate and Rhythm: Normal rate and regular rhythm.     Heart sounds: Normal heart sounds. No murmur. No friction rub. No gallop.   Pulmonary:     Effort: Pulmonary effort is normal. No respiratory distress.     Breath sounds: Normal breath sounds. No stridor. No wheezing or rales.  Chest:     Chest wall: No tenderness.  Abdominal:     General: Bowel sounds are normal. There is no distension.     Palpations: Abdomen is soft. There is no mass.     Tenderness: There is no abdominal tenderness. There is no guarding or rebound.     Hernia: No hernia is present.  Musculoskeletal:     Lumbar back: She exhibits normal range of motion, no tenderness, no pain and no spasm.     Right hand: She exhibits decreased range of motion, tenderness, bony tenderness and deformity.     Left hand: She exhibits decreased range of motion, tenderness, bony tenderness and deformity.  Lymphadenopathy:     Cervical: No cervical adenopathy.           Assessment & Plan:  Screening cholesterol level - Plan: CBC with Differential/Platelet, COMPLETE METABOLIC PANEL WITH GFR, Lipid panel  General medical exam  Primary insomnia  Sleep apnea, unspecified type - Plan: Ambulatory referral to Sleep Studies  I would like the patient to return fasting for a CBC, CMP, fasting lipid panel as a cholesterol screen as this is not been done in several years.  Her mammogram is up-to-date.  She gets her Pap smear performed at her gynecologist.  She believes she may be due for a colonoscopy.  We  discussed it at length and she is decided to get Cologuard screening.  Therefore I will schedule her for this.  I am concerned that she may have obstructive sleep apnea based on some of the story she is telling me.  I will schedule the patient for a sleep study.  She is also battling insomnia and we will try Ambien 5 mg p.o. nightly as needed insomnia.  Her immunizations are up-to-date.  Her bone density is up-to-date.  She denies any problems with depression.  She did have one major fall this year where she tripped and sustained a fracture however she denies any problems following on a daily basis.

## 2018-01-09 ENCOUNTER — Other Ambulatory Visit: Payer: Medicare HMO

## 2018-01-09 DIAGNOSIS — E785 Hyperlipidemia, unspecified: Secondary | ICD-10-CM | POA: Diagnosis not present

## 2018-01-09 DIAGNOSIS — Z13220 Encounter for screening for lipoid disorders: Secondary | ICD-10-CM | POA: Diagnosis not present

## 2018-01-10 LAB — COMPLETE METABOLIC PANEL WITH GFR
AG RATIO: 1.8 (calc) (ref 1.0–2.5)
ALT: 23 U/L (ref 6–29)
AST: 15 U/L (ref 10–35)
Albumin: 4.3 g/dL (ref 3.6–5.1)
Alkaline phosphatase (APISO): 77 U/L (ref 33–130)
BILIRUBIN TOTAL: 0.5 mg/dL (ref 0.2–1.2)
BUN/Creatinine Ratio: 38 (calc) — ABNORMAL HIGH (ref 6–22)
BUN: 21 mg/dL (ref 7–25)
CHLORIDE: 101 mmol/L (ref 98–110)
CO2: 26 mmol/L (ref 20–32)
Calcium: 9.7 mg/dL (ref 8.6–10.4)
Creat: 0.56 mg/dL — ABNORMAL LOW (ref 0.60–0.93)
GFR, EST AFRICAN AMERICAN: 109 mL/min/{1.73_m2} (ref >=60)
GFR, Est Non African American: 94 mL/min/{1.73_m2} (ref >=60)
Globulin: 2.4 g/dL (calc) (ref 1.9–3.7)
Glucose, Bld: 90 mg/dL (ref 65–99)
Potassium: 4.9 mmol/L (ref 3.5–5.3)
Sodium: 137 mmol/L (ref 135–146)
Total Protein: 6.7 g/dL (ref 6.1–8.1)

## 2018-01-10 LAB — CBC WITH DIFFERENTIAL/PLATELET
ABSOLUTE MONOCYTES: 382 {cells}/uL (ref 200–950)
Basophils Absolute: 63 cells/uL (ref 0–200)
Basophils Relative: 1.1 %
EOS PCT: 2.8 %
Eosinophils Absolute: 160 cells/uL (ref 15–500)
HCT: 40.4 % (ref 35.0–45.0)
Hemoglobin: 13.6 g/dL (ref 11.7–15.5)
Lymphs Abs: 2662 cells/uL (ref 850–3900)
MCH: 29.5 pg (ref 27.0–33.0)
MCHC: 33.7 g/dL (ref 32.0–36.0)
MCV: 87.6 fL (ref 80.0–100.0)
MONOS PCT: 6.7 %
MPV: 9.6 fL (ref 7.5–12.5)
Neutro Abs: 2434 cells/uL (ref 1500–7800)
Neutrophils Relative %: 42.7 %
PLATELETS: 300 10*3/uL (ref 140–400)
RBC: 4.61 10*6/uL (ref 3.80–5.10)
RDW: 12.7 % (ref 11.0–15.0)
TOTAL LYMPHOCYTE: 46.7 %
WBC: 5.7 10*3/uL (ref 3.8–10.8)

## 2018-01-10 LAB — LIPID PANEL
Cholesterol: 206 mg/dL — ABNORMAL HIGH (ref <200)
HDL: 64 mg/dL (ref >50)
LDL Cholesterol (Calc): 116 mg/dL (calc) — ABNORMAL HIGH
Non-HDL Cholesterol (Calc): 142 mg/dL (calc) — ABNORMAL HIGH (ref <130)
TRIGLYCERIDES: 143 mg/dL (ref <150)
Total CHOL/HDL Ratio: 3.2 (calc) (ref <5.0)

## 2018-02-08 ENCOUNTER — Institutional Professional Consult (permissible substitution): Payer: Medicare HMO | Admitting: Neurology

## 2018-02-15 ENCOUNTER — Ambulatory Visit (INDEPENDENT_AMBULATORY_CARE_PROVIDER_SITE_OTHER): Payer: Medicare HMO | Admitting: Neurology

## 2018-02-15 ENCOUNTER — Encounter: Payer: Self-pay | Admitting: Neurology

## 2018-02-15 VITALS — BP 128/84 | HR 96 | Ht 64.0 in | Wt 179.0 lb

## 2018-02-15 DIAGNOSIS — I9788 Other intraoperative complications of the circulatory system, not elsewhere classified: Secondary | ICD-10-CM | POA: Diagnosis not present

## 2018-02-15 DIAGNOSIS — R0683 Snoring: Secondary | ICD-10-CM

## 2018-02-15 DIAGNOSIS — J3489 Other specified disorders of nose and nasal sinuses: Secondary | ICD-10-CM | POA: Diagnosis not present

## 2018-02-15 DIAGNOSIS — F5104 Psychophysiologic insomnia: Secondary | ICD-10-CM

## 2018-02-15 DIAGNOSIS — R6889 Other general symptoms and signs: Secondary | ICD-10-CM | POA: Diagnosis not present

## 2018-02-15 DIAGNOSIS — R0989 Other specified symptoms and signs involving the circulatory and respiratory systems: Secondary | ICD-10-CM

## 2018-02-15 DIAGNOSIS — R69 Illness, unspecified: Secondary | ICD-10-CM | POA: Diagnosis not present

## 2018-02-15 DIAGNOSIS — R9431 Abnormal electrocardiogram [ECG] [EKG]: Secondary | ICD-10-CM | POA: Diagnosis not present

## 2018-02-15 DIAGNOSIS — R0902 Hypoxemia: Secondary | ICD-10-CM

## 2018-02-15 DIAGNOSIS — I10 Essential (primary) hypertension: Secondary | ICD-10-CM

## 2018-02-15 MED ORDER — TRIAMCINOLONE ACETONIDE 55 MCG/ACT NA AERO: 2.0000 | INHALATION_SPRAY | Freq: Every day | NASAL | 12 refills | Status: DC

## 2018-02-15 NOTE — Patient Instructions (Signed)

## 2018-02-15 NOTE — Progress Notes (Signed)
SLEEP MEDICINE CLINIC   Provider:  Larey Seat, MD    Primary Care Physician:  Susy Frizzle, MD   Referring Provider: Susy Frizzle, MD    Chief Complaint  Patient presents with  . New Patient (Initial Visit)    pt with husband, rm 45. pt states that when she had surgery on her broken ankle she showed signs of sleep apnea. pt for several years she has had difficulty with waking up frequently snoring and gasping for air. she has seen ENT before and there was some blockage noted. pt states that she is a mouth breather    HPI:  Lynn Estes is a 73 y.o. female patient, seen here on 02-15-2018 in a referral  from Dr. Dennard Schaumann for a sleep apnea evaluation.   Chief complaint according to patient : I have not slept well for years and after a surgery was told I had apnea ".  Lynn Estes - [name] is seen here today in the presence of her husband, she is a 31 year old Caucasian right-handed female .  Sleep relevant medical history: She suffered an ankle and foot fracture in summer 2019.  These multiple fractures had to be treated surgically and during and after the procedure it became evident to her surgeon and anesthesiologist that she had prolonged hypoxemia and likely suffers from sleep apnea.  Earlier that year she had been evaluated in cardiology for an atypical chest pain, but stress test and echocardiogram as well as EKG were normal.    The patient had undergone a sleep test almost 7 years ago at [facility] sleep lab, she had no significant apnea only an AHI of 2.4/h, she was breathing through her mouth for most of the night, during REM sleep her apnea hypopnea index increased drastically to 19.7 and she was snoring moderately loud.  The total time of oxygen saturation was not indicated.  She had sinus rhythm by EKG was occasional PACs.    She has lifelong nasal septal deviation, bothering her for over 20 years. She has a very nasal voice. She has notable left eye droop,  left mouth droop, but this has been present for years.   Sleep habits are as follows: Dinnertime for the patient is around 7 PM, she drinks one glass of red wine,  Takes Prn Melatonin usually retreats to bed around 10 PM- but  she may not be asleep for a long time and has recently started to watch TV in the bedroom.  However it has only given her a fragmented sleep pattern.   She reports that after she falls asleep she is awake about every 60-120 minutes.  Nocturia is not a problem.  She is a mouth breather she wakes with a dry mouth and she does sip water during the night.  She prefers sleeping on her side on one pillow. She rises any time between 5 and 7.30, has one coffee in AM. She doesn't nap.    Family history: orphaned at age 24 ( father ) and 23 ( mother died) - not available    Social history:  Married, retired from Herbalist at [employer].    Review of Systems: Out of a complete 14 system review, the patient complains of only the following symptoms, and all other reviewed systems are negative.   Epworth score 2 , Fatigue severity score: 56/ 63   , depression score    Social History   Socioeconomic History  . Marital status: Married    Spouse  name: Not on file  . Number of children: 2  . Years of education: 69  . Highest education level: Not on file  Occupational History  . 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 Financial resource strain: Not on file  . Food insecurity:    Worry: Not on file    Inability: Not on file  . Transportation needs:    Medical: Not on file    Non-medical: Not on file  Tobacco Use  . Smoking status: Never Smoker  . Smokeless tobacco: Never Used  Substance and Sexual Activity  . Alcohol use: Yes    Alcohol/week: 5.0 standard drinks    Types: 5 Glasses of wine per week  . Drug use: No  . Sexual activity: Not on file  Lifestyle  . Physical activity:    Days per week: 5 days    Minutes per  session: 40 min  . Stress: Not on file  Relationships  . Social connections:    Talks on phone: Not on file    Gets together: Not on file    Attends religious service: Not on file    Active member of club or organization: Not on file    Attends meetings of clubs or organizations: Not on file    Relationship status: Not on file  . Intimate partner violence:    Fear of current or ex partner: Not on file    Emotionally abused: Not on file    Physically abused: Not on file    Forced sexual activity: Not on file  Other Topics Concern  . Not on file  Social History Narrative   PFT---08-20-2009   fev1-2.04, 92%, fev1/fvc 0.79   Widowed, remarried.  2 kids   Works part time Naval architect     Family History  Problem Relation Age of Onset  . Hypertension Brother     Past Medical History:  Diagnosis Date  . Asthma   . Cataract   . Diverticulitis   . History of shingles   . Hypertension   . Lung nodules    Nodules noted on CT scan July 2017  . OSA (obstructive sleep apnea)   . Partial small bowel obstruction [facility])     Past Surgical History:  Procedure Laterality Date  . ANKLE SURGERY    . CATARACT EXTRACTION W/PHACO Left 06/08/2015   Procedure: CATARACT EXTRACTION PHACO AND INTRAOCULAR LENS PLACEMENT LEFT EYE CDE=7.18;  Surgeon: Williams Che, MD;  Location: [facility];  Service: Ophthalmology;  Laterality: Left;  . DILATION AND CURETTAGE OF UTERUS     X 2  . EXERCISE TOLERANCE TEST (GXT)  11/2016   Low Exercise Tolerance - 6:24 min (dyspnea, fatigue), upsloping ST segment depression noted.  Nondiagnostic.  HR 150 bpm = 97% MPHR). Hypertensive response to exercise. NEGATIVE test.  No arrhythmias.  . right rotator cuff surgery    . TRANSTHORACIC ECHOCARDIOGRAM  11/2016   Normal LV size and function.  GR 1 DD.  Moderate concentric hypertrophy.  Vigorous LV function with EF of 65-70%.    Current Outpatient Medications  Medication Sig Dispense Refill  .  Cholecalciferol (VITAMIN D PO) Take by mouth.    . Cyanocobalamin (VITAMIN B 12 PO) Take by mouth.    . diclofenac (VOLTAREN) 75 MG EC tablet Take 1 tablet (75 mg total) by mouth 2 (two) times daily. 30 tablet 0  . Fluocinolone Acetonide Scalp 0.01 % OIL Apply topically.    Marland Kitchen  hydrochlorothiazide 25 MG tablet Take 12.5 mg by mouth daily.     Marland Kitchen losartan (COZAAR) 50 MG tablet Take 50 mg daily by mouth.    . Melatonin 5 MG TABS Take by mouth.    . metoprolol tartrate (LOPRESSOR) 25 MG tablet Take 1 tablet (25 mg total) by mouth daily. 90 tablet 3  . Omega-3 Fatty Acids (FISH OIL) 1000 MG CAPS Fish Oil    . zolpidem (AMBIEN) 10 MG tablet Take 1 tablet (10 mg total) by mouth at bedtime as needed for sleep. 15 tablet 1   No current facility-administered medications for this visit.     Allergies as of 02/15/2018  . (No Known Allergies)    Vitals: BP 128/84   Pulse 96   Ht 5\' 4"  (1.626 m)   Wt 179 lb (81.2 kg)   BMI 30.73 kg/m  Last Weight:  Wt Readings from Last 1 Encounters:  02/15/18 179 lb (81.2 kg)   IWP:YKDX mass index is 30.73 kg/m.     Last Height:   Ht Readings from Last 1 Encounters:  02/15/18 5\' 4"  (1.626 m)    Physical exam:  General: The patient is awake, alert and appears not in acute distress. The patient is well groomed. Head: Normocephalic, atraumatic. Neck is supple. Mallampati 3  neck circumference 15" . Nasal airflow congested, septum deviated. Bruxism marks on teeth. , .high grade retrognathia is seen.  Cardiovascular:  Regular rate and rhythm , without  murmurs or carotid bruit, and without distended neck veins. Respiratory: Lungs are clear to auscultation. Skin:  Without evidence of edema, or rash Trunk: BMI is 31.   Neurologic exam : The patient is awake and alert, oriented to place and time.   Attention span & concentration ability appears normal.  Speech is fluent,  without dysarthria, dysphonia or aphasia.  Mood and affect are appropriate.  Cranial  nerves: Pupils are equal and briskly reactive to light. Extraocular movements  in vertical and horizontal planes intact and without nystagmus. Visual fields by finger perimetry are intact. Hearing to finger rub intact.  Facial sensation intact to fine touch.Facial motor strength is symmetric and tongue and uvula move midline. Shoulder shrug was symmetrical.   Motor exam:  Normal tone, muscle bulk and symmetric strength in upper extremities.  Sensory:  Fine touch, pinprick and vibration were tested in all extremities. Proprioception tested in the upper extremities was normal.  Coordination: Rapid alternating movements in the fingers/hands was normal. Finger-to-nose maneuver  normal without evidence of ataxia, dysmetria or tremor.  Gait and station: Patient walks without assistive device and she has limited ROM in the left ankle.  Turns with 4 Steps.  Deep tendon reflexes: in the  upper and lower extremities are symmetric and intact.    Assessment:  After physical and neurologic examination, review of laboratory studies,  Personal review of imaging studies, reports of other /same  Imaging studies, results of polysomnography and / or neurophysiology testing and pre-existing records as far as provided in visit., my assessment is   1) Insomnia. We discussed about the bedroom needing to be cool, quiet and dark.  She may listen to a meditation tape or some kind of some machine in the background but I would like her to eliminate all screen night.  She may also do better if she turns the clocks around.   2) she had apnea and is a snorer, we will need a new sleep study.  PSG Split .   3) nasal  patency issues- she has a very deviated nasal septum and is often congested, she has not used nasal spray. I ordered Nasacort.    The patient was advised of the nature of the diagnosed disorder , the treatment options and the  risks for general health and wellness arising from not treating the condition.   I  spent more than 50 minutes of face to face time with the patient.  Greater than 50% of time was spent in counseling and coordination of care. We have discussed the diagnosis and differential and I answered the patient's questions.     Larey Seat, MD 06/30/3660, 9:47 PM  Certified in Neurology by ABPN Certified in [place] by [facility] [address], [place], [place] [address]

## 2018-02-20 ENCOUNTER — Telehealth: Payer: Self-pay

## 2018-02-20 ENCOUNTER — Other Ambulatory Visit: Payer: Self-pay | Admitting: Neurology

## 2018-02-20 DIAGNOSIS — R9431 Abnormal electrocardiogram [ECG] [EKG]: Secondary | ICD-10-CM

## 2018-02-20 DIAGNOSIS — R0683 Snoring: Secondary | ICD-10-CM

## 2018-02-20 DIAGNOSIS — I10 Essential (primary) hypertension: Secondary | ICD-10-CM

## 2018-02-20 DIAGNOSIS — I9788 Other intraoperative complications of the circulatory system, not elsewhere classified: Secondary | ICD-10-CM

## 2018-02-20 DIAGNOSIS — R0989 Other specified symptoms and signs involving the circulatory and respiratory systems: Secondary | ICD-10-CM

## 2018-02-20 DIAGNOSIS — R6889 Other general symptoms and signs: Secondary | ICD-10-CM

## 2018-02-20 DIAGNOSIS — F5104 Psychophysiologic insomnia: Secondary | ICD-10-CM

## 2018-02-20 DIAGNOSIS — J3489 Other specified disorders of nose and nasal sinuses: Secondary | ICD-10-CM

## 2018-02-20 DIAGNOSIS — R0902 Hypoxemia: Principal | ICD-10-CM

## 2018-02-20 NOTE — Telephone Encounter (Signed)
[employer] denied in lab sleep study. Does not meet criteria. Need HST order

## 2018-02-20 NOTE — Telephone Encounter (Signed)
Order placed

## 2018-03-12 ENCOUNTER — Ambulatory Visit: Payer: Medicare HMO | Admitting: Neurology

## 2018-03-12 DIAGNOSIS — R0989 Other specified symptoms and signs involving the circulatory and respiratory systems: Secondary | ICD-10-CM

## 2018-03-12 DIAGNOSIS — G4733 Obstructive sleep apnea (adult) (pediatric): Secondary | ICD-10-CM

## 2018-03-12 DIAGNOSIS — R9431 Abnormal electrocardiogram [ECG] [EKG]: Secondary | ICD-10-CM

## 2018-03-12 DIAGNOSIS — I10 Essential (primary) hypertension: Secondary | ICD-10-CM

## 2018-03-12 DIAGNOSIS — J3489 Other specified disorders of nose and nasal sinuses: Secondary | ICD-10-CM

## 2018-03-12 DIAGNOSIS — R0683 Snoring: Secondary | ICD-10-CM

## 2018-03-12 DIAGNOSIS — F5104 Psychophysiologic insomnia: Secondary | ICD-10-CM

## 2018-03-12 DIAGNOSIS — R6889 Other general symptoms and signs: Secondary | ICD-10-CM

## 2018-03-12 DIAGNOSIS — I9788 Other intraoperative complications of the circulatory system, not elsewhere classified: Secondary | ICD-10-CM

## 2018-03-12 DIAGNOSIS — R0902 Hypoxemia: Principal | ICD-10-CM

## 2018-03-15 ENCOUNTER — Institutional Professional Consult (permissible substitution): Payer: Medicare HMO | Admitting: Neurology

## 2018-03-20 NOTE — Procedures (Signed)
NAME:   Lynn Estes                                                            DOB:[birth year] MEDICAL RECORD no:   [id number]                                              DOS: 03/12/2018 REFERRING PHYSICIAN: Jenna Luo, MD STUDY PERFORMED: [facility] on [facility] HISTORY: [name] is a 73 y.o. female patient, seen on 02-15-2018 in a referral from Dr. Dennard Schaumann for a sleep apnea evaluation.   Chief complaint according to patient:" I have not slept well for years and after a surgery was told I had apnea ". Sleep relevant medical history: She suffered an ankle and foot fracture in summer 2019.  These multiple fractures had to be treated surgically and during and after the procedure it became evident to her surgeon and anesthesiologist that she had prolonged hypoxemia and likely suffers from sleep apnea.  Earlier that year she had been evaluated in cardiology for an atypical chest pain, but stress test and echocardiogram as well as EKG were normal.   The patient had undergone a sleep test almost 7 years ago at [facility] lab, she had no significant apnea only an AHI of 2.4/h, she was breathing through her mouth for most of the night, during REM sleep her apnea hypopnea index increased drastically to 19.7 and she was snoring moderately loud.  The total time of oxygen saturation was not indicated.  She had sinus rhythm by EKG was occasional PACs.    She has lifelong nasal septal deviation, bothering her for over 20 years. She has a very nasal voice. She has notable left eye droop, left mouth droop, but this has been present for years.    Epworth score 2 points, Fatigue severity score: 56/ 63 points, BMI: 30.5 kg/m2.   STUDY RESULTS:  Total Recording Time:  10 h 4 mins; Total Sleep Time: 8 h 5 mins. Total Apnea/Hypopnea Index (AHI): 16.8 /h; RDI:  17.4/h; REM AHI: 57.9 /h. Average Oxygen Saturation:  93 %; Lowest Oxygen Saturation:  79 %.  Total Time in Oxygen Saturation Below or at 88 %: 5.9  minutes. Average Heart Rate:   72 bpm (between 54 and 97 bpm). IMPRESSION: Mild to Moderate OSA, Moderate- Loud Snoring, severe REM sleep accentuation.  RECOMMENDATION: The REM sleep dependency of this apnea make CPAP therapy the best solution. AUTO CPAP 5-15 cm water, 2 cm EPR, heated humidity and mask of patient's choice.  I certify that I have reviewed the raw data recording prior to the issuance of this report in accordance with the standards of the American Academy of Sleep Medicine (AASM). Larey Seat, M.D.   03-20-2018   Medical Director of [facility] at [facility], accredited by the AASM. Diplomat of the ABPN and ABSM.

## 2018-03-20 NOTE — Addendum Note (Signed)
Addended by: Larey Seat on: 03/20/2018 01:38 PM   Modules accepted: Orders

## 2018-03-21 ENCOUNTER — Telehealth: Payer: Self-pay | Admitting: Neurology

## 2018-03-21 NOTE — Telephone Encounter (Signed)
-----   Message from Larey Seat, MD sent at 03/20/2018  1:38 PM EDT ----- IMPRESSION: Mild to Moderate OSA, Moderate- Loud Snoring, severe  REM sleep accentuation.  RECOMMENDATION: The REM sleep dependency of this apnea makes CPAP  therapy the best solution. AUTO CPAP 5-15 cm water, 2 cm EPR,  heated humidity and mask of patient's choice.

## 2018-03-21 NOTE — Telephone Encounter (Signed)
I called Lynn Estes. I advised Lynn Estes that Dr. Brett Fairy reviewed their sleep study results and found that Lynn Estes mild to moderate . Dr. Brett Fairy recommends that Lynn Estes starts auto CPAP. I reviewed PAP compliance expectations with the Lynn Estes. Lynn Estes is agreeable to starting a CPAP. I advised Lynn Estes that an order will be sent to a DME, [facility], and [facility] will call the Lynn Estes within about one week after they file with the Lynn Estes's insurance. [facility] will show the Lynn Estes how to use the machine, fit for masks, and troubleshoot the CPAP if needed. A follow up appt was made for insurance purposes with Debbora Presto on May 18,2020 at 10:30 am. Lynn Estes verbalized understanding to arrive 15 minutes early and bring their CPAP. A letter with all of this information in it will be mailed to the Lynn Estes as a reminder. I verified with the Lynn Estes that the address we have on file is correct. Lynn Estes verbalized understanding of results. Lynn Estes had no questions at this time but was encouraged to call back if questions arise. I have sent the order to [facility] and have received confirmation that they have received the order.

## 2018-03-29 ENCOUNTER — Other Ambulatory Visit: Payer: Self-pay | Admitting: Family Medicine

## 2018-03-29 DIAGNOSIS — G4733 Obstructive sleep apnea (adult) (pediatric): Secondary | ICD-10-CM | POA: Diagnosis not present

## 2018-04-29 DIAGNOSIS — G4733 Obstructive sleep apnea (adult) (pediatric): Secondary | ICD-10-CM | POA: Diagnosis not present

## 2018-05-17 ENCOUNTER — Telehealth: Payer: Self-pay

## 2018-05-17 NOTE — Telephone Encounter (Signed)
Spoke with the patient and she has given verbal consent do a telephone visit. She states that she has no one to help her with a virtual visit.

## 2018-05-17 NOTE — Telephone Encounter (Signed)
She has given verbal consent to file her insurance as well.

## 2018-05-20 DIAGNOSIS — Z9989 Dependence on other enabling machines and devices: Secondary | ICD-10-CM | POA: Insufficient documentation

## 2018-05-20 DIAGNOSIS — G4733 Obstructive sleep apnea (adult) (pediatric): Secondary | ICD-10-CM | POA: Insufficient documentation

## 2018-05-20 NOTE — Progress Notes (Signed)
PATIENT: Lynn Estes DOB: 24-Sep-1945  REASON FOR VISIT: follow up HISTORY FROM: patient  Virtual Visit via Telephone Note  I connected with Keltie Estes on 05/21/18 at 10:30 AM EDT by telephone and verified that I am speaking with the correct person using two identifiers.   I discussed the limitations, risks, security and privacy concerns of performing an evaluation and management service by telephone and the availability of in person appointments. I also discussed with the patient that there may be a patient responsible charge related to this service. The patient expressed understanding and agreed to proceed.   History of Present Illness:  05/21/18 Lynn Estes is a 73 y.o. female for follow up of OSA on CPAP. She has used CPAP every night but does mention that she has moisture buildup in mask that runs down her cheek. She is using full face mask. She has noted benefit with increased energy but is concerned about leak.   Download report as follows:  04/21/2018 - 05/20/2018 20 - 05/20/2018 Usage days 30/30 days (100%) >= 4 hours 29 days (97%) < 4 hours 1 days (3%) Usage hours 188 hours 8 minutes Average usage (total days) 6 hours 16 minutes Average usage (days used) 6 hours 16 minutes Median usage (days used) 6 hours 13 minutes Total used hours (value since last reset - 05/20/2018) 314 hours AirSense 10 AutoSet Serial number [id number] Mode AutoSet Min Pressure 5 cmH2O Max Pressure 15 cmH2O EPR Fulltime EPR level 3 Response Soft Therapy Pressure - cmH2O Median: 8.1 95th percentile: 10.6 Maximum: 11.6 Leaks - L/min Median: 7.0 95th percentile: 30.9 Maximum: 46.8 Events per hour AI: 0.0 HI: 0.2 AHI: 0.2 Apnea Index Central: 0.0 Obstructive: 0.0 Unknown: 0.0 RERA Index 0.3 Cheyne-Stokes respiration (average duration per night) 0 minutes (0%)   History (copied from Dr Edwena Felty note on 02/15/2018)  HPI:  Lynn Estes is a 73 y.o. female  patient, seen here on 02-15-2018 in a referral  from Dr. Dennard Schaumann for a sleep apnea evaluation.   Chief complaint according to patient : I have not slept well for years and after a surgery was told I had apnea ".  [name] - Lynn Estes is seen here today in the presence of her husband, she is a 57 year old Caucasian right-handed female .  Sleep relevant medical history: She suffered an ankle and foot fracture in summer 2019.  These multiple fractures had to be treated surgically and during and after the procedure it became evident to her surgeon and anesthesiologist that she had prolonged hypoxemia and likely suffers from sleep apnea.  Earlier that year she had been evaluated in cardiology for an atypical chest pain, but stress test and echocardiogram as well as EKG were normal.    The patient had undergone a sleep test almost 7 years ago at [facility] sleep lab, she had no significant apnea only an AHI of 2.4/h, she was breathing through her mouth for most of the night, during REM sleep her apnea hypopnea index increased drastically to 19.7 and she was snoring moderately loud.  The total time of oxygen saturation was not indicated.  She had sinus rhythm by EKG was occasional PACs.    She has lifelong nasal septal deviation, bothering her for over 20 years. She has a very nasal voice. She has notable left eye droop, left mouth droop, but this has been present for years.   Sleep habits are as follows: Dinnertime for the patient is around 7 PM, she drinks one glass of  red wine,  Takes Prn Melatonin usually retreats to bed around 10 PM- but  she may not be asleep for a long time and has recently started to watch TV in the bedroom.  However it has only given her a fragmented sleep pattern.   She reports that after she falls asleep she is awake about every 60-120 minutes.  Nocturia is not a problem.  She is a mouth breather she wakes with a dry mouth and she does sip water during the night.  She prefers  sleeping on her side on one pillow. She rises any time between 5 and 7.30, has one coffee in AM. She doesn't nap.    Family history: orphaned at age 29 ( father ) and 73 ( mother died) - not available    Social history:  Married, retired from Herbalist at [employer].   Observations/Objective:  Generalized: Well developed, in no acute distress  Mentation: Alert oriented to time, place, history taking. Follows all commands speech and language fluent   Assessment and Plan:  73 y.o. year old female  has a past medical history of Asthma, Cataract, Diverticulitis, History of shingles, Hypertension, Lung nodules, OSA (obstructive sleep apnea), and Partial small bowel obstruction ([place]). with    ICD-10-CM   1. OSA on CPAP G47.33 For home use only DME continuous positive airway pressure (CPAP)   Z99.89    CPAP download shows compliance. She is doing well with CPAP therapy with the exception of a noted leak. We will send orders for mask refitting for correction of leak. She was educated on need for nightly use of CPAP therapy and for at least 4 hours each night. We have discussed risks of untreated sleep apnea. She verbalizes understanding. We will follow up in 3 months to ensure leak is corrected and continued compliance.   Orders Placed This Encounter  Procedures  . For home use only DME continuous positive airway pressure (CPAP)    Mask refit please d/t leak on download report    Order Specific Question:   Patient has OSA or probable OSA    Answer:   Yes    Order Specific Question:   Is the patient currently using CPAP in the home    Answer:   Yes    Order Specific Question:   Settings    Answer:   Other see comments    Order Specific Question:   CPAP supplies needed    Answer:   Mask, headgear, cushions, filters, heated tubing and water chamber    No orders of the defined types were placed in this encounter.    Follow Up Instructions:  I discussed the assessment and  treatment plan with the patient. The patient was provided an opportunity to ask questions and all were answered. The patient agreed with the plan and demonstrated an understanding of the instructions.   The patient was advised to call back or seek an in-person evaluation if the symptoms worsen or if the condition fails to improve as anticipated.  I provided 25 minutes of non-face-to-face time during this encounter. Patient is located at her place of residence. Provider is located at her place of residence. Liane Comber, RN helped to facilitate visit.    Debbora Presto, NP

## 2018-05-21 ENCOUNTER — Other Ambulatory Visit: Payer: Self-pay

## 2018-05-21 ENCOUNTER — Encounter: Payer: Self-pay | Admitting: Family Medicine

## 2018-05-21 ENCOUNTER — Ambulatory Visit (INDEPENDENT_AMBULATORY_CARE_PROVIDER_SITE_OTHER): Payer: Medicare HMO | Admitting: Family Medicine

## 2018-05-21 DIAGNOSIS — G4733 Obstructive sleep apnea (adult) (pediatric): Secondary | ICD-10-CM | POA: Diagnosis not present

## 2018-05-21 DIAGNOSIS — Z9989 Dependence on other enabling machines and devices: Secondary | ICD-10-CM

## 2018-05-29 DIAGNOSIS — G4733 Obstructive sleep apnea (adult) (pediatric): Secondary | ICD-10-CM | POA: Diagnosis not present

## 2018-06-29 DIAGNOSIS — G4733 Obstructive sleep apnea (adult) (pediatric): Secondary | ICD-10-CM | POA: Diagnosis not present

## 2018-07-05 DIAGNOSIS — M1712 Unilateral primary osteoarthritis, left knee: Secondary | ICD-10-CM | POA: Diagnosis not present

## 2018-07-16 IMAGING — CR DG KNEE COMPLETE 4+V*R*
4 series · 4 of 4 positions shown · non-contrast
Comparison: None

CLINICAL DATA: RIGHT knee pain for 3 weeks, no trauma, pain
primarily popliteal and radiating down calf

EXAM:
RIGHT KNEE - COMPLETE 4+ VIEW

[w knee ap right]
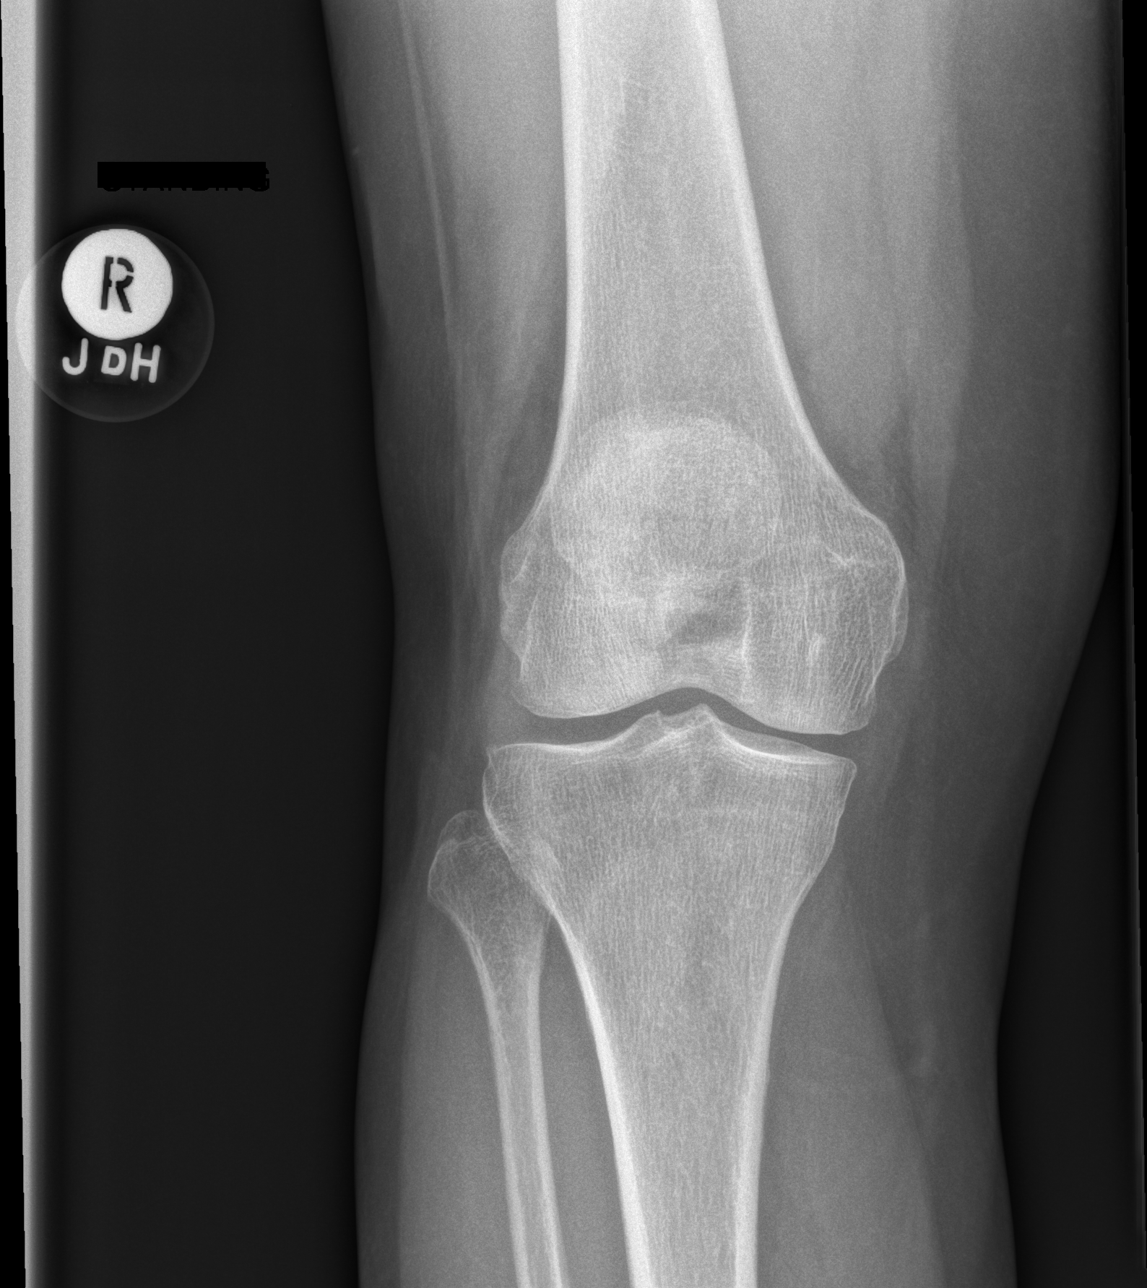

[w knee lat right]
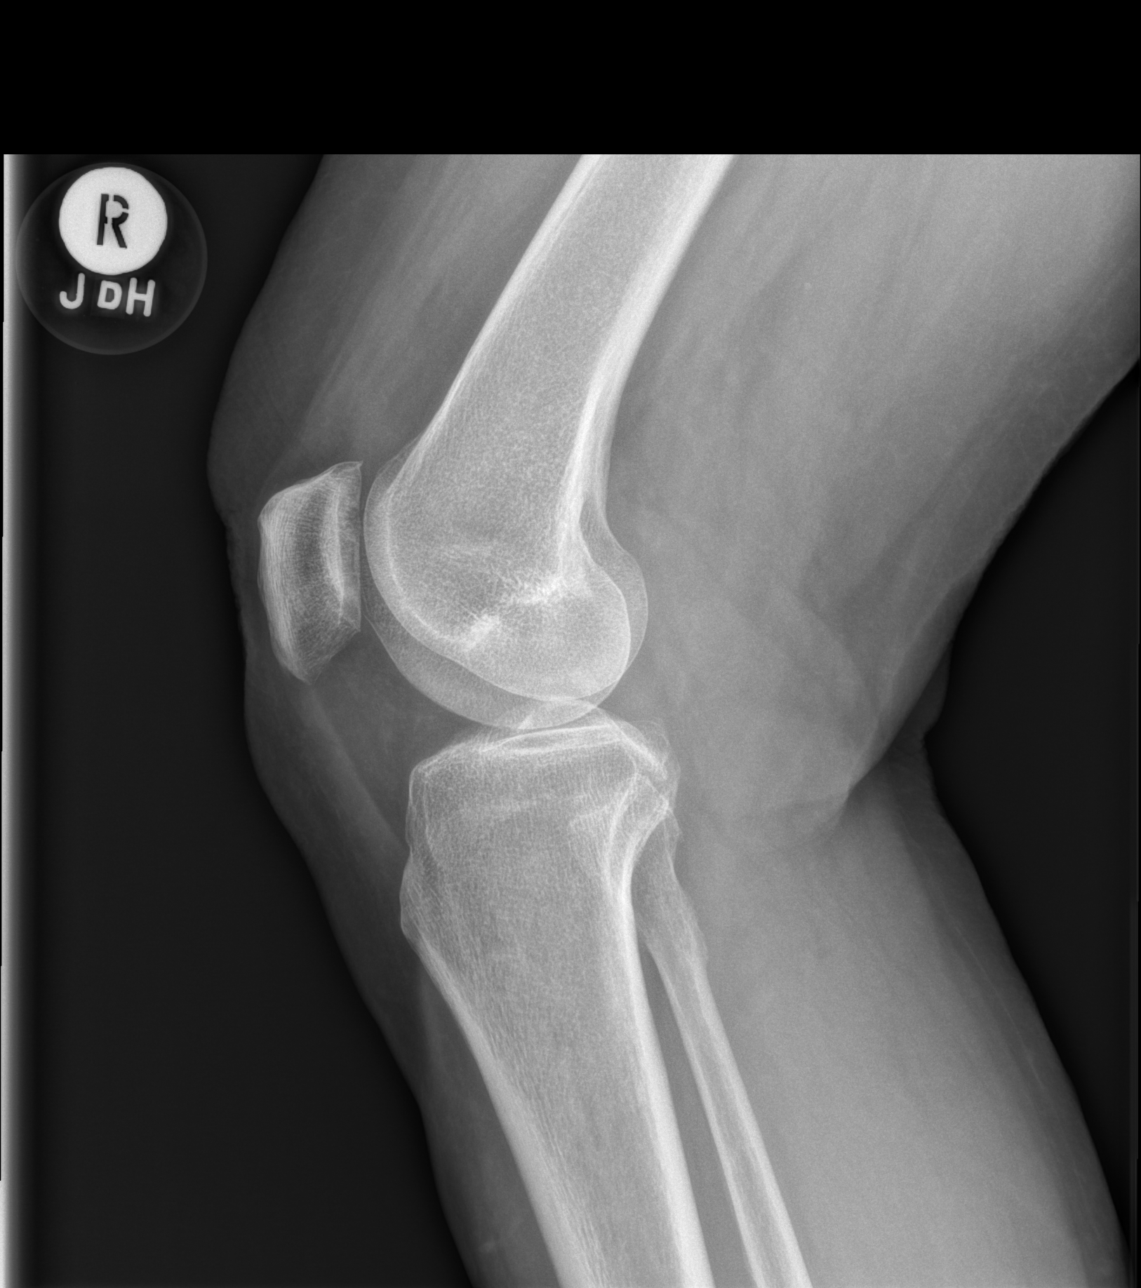

[x knee tunnel right]
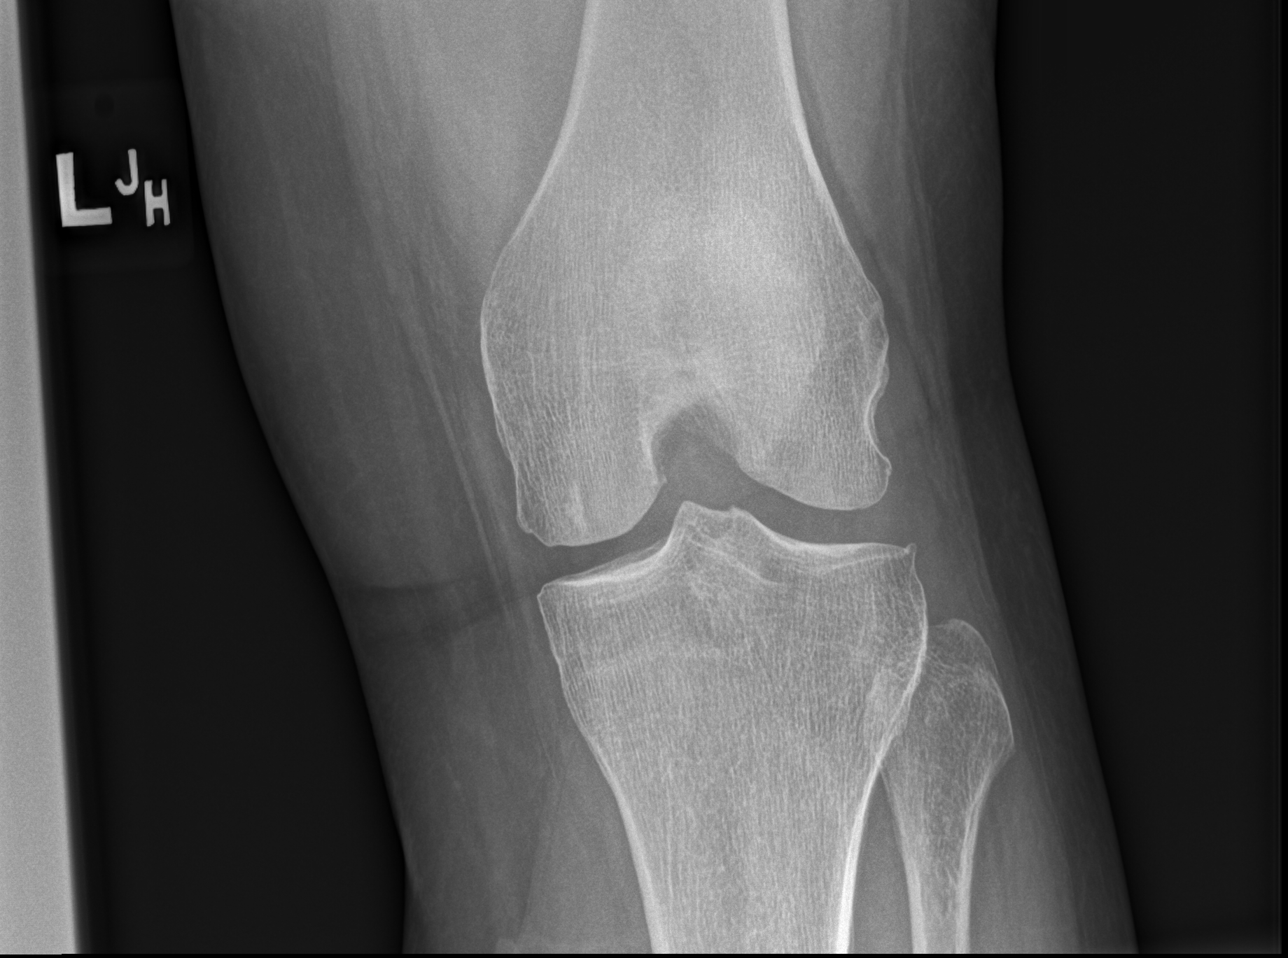

[x knee sunrise right]
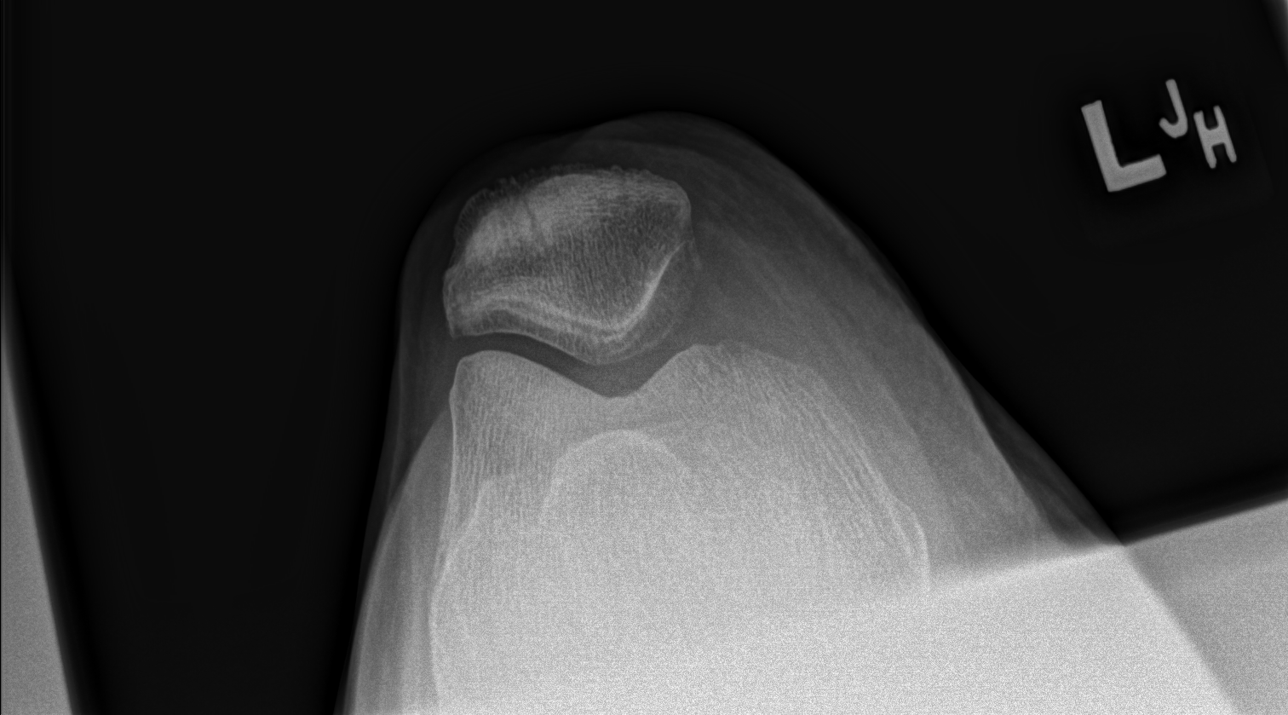

[4 of 4 positions shown; findings below may reference images not displayed]

FINDINGS: Osseous mineralization normal.

Joint spaces preserved.

No fracture, dislocation, or bone destruction.

No joint effusion.
IMPRESSION: No acute osseous abnormalities.

## 2018-07-29 DIAGNOSIS — G4733 Obstructive sleep apnea (adult) (pediatric): Secondary | ICD-10-CM | POA: Diagnosis not present

## 2018-08-13 ENCOUNTER — Other Ambulatory Visit: Payer: Self-pay | Admitting: Family Medicine

## 2018-08-29 DIAGNOSIS — G4733 Obstructive sleep apnea (adult) (pediatric): Secondary | ICD-10-CM | POA: Diagnosis not present

## 2018-09-11 DIAGNOSIS — G4733 Obstructive sleep apnea (adult) (pediatric): Secondary | ICD-10-CM | POA: Diagnosis not present

## 2018-09-20 DIAGNOSIS — Z7689 Persons encountering health services in other specified circumstances: Secondary | ICD-10-CM | POA: Diagnosis not present

## 2018-09-20 DIAGNOSIS — H2511 Age-related nuclear cataract, right eye: Secondary | ICD-10-CM | POA: Diagnosis not present

## 2018-09-20 DIAGNOSIS — H11441 Conjunctival cysts, right eye: Secondary | ICD-10-CM | POA: Diagnosis not present

## 2018-09-20 DIAGNOSIS — H02403 Unspecified ptosis of bilateral eyelids: Secondary | ICD-10-CM | POA: Diagnosis not present

## 2018-09-20 DIAGNOSIS — Z961 Presence of intraocular lens: Secondary | ICD-10-CM | POA: Diagnosis not present

## 2018-09-29 DIAGNOSIS — G4733 Obstructive sleep apnea (adult) (pediatric): Secondary | ICD-10-CM | POA: Diagnosis not present

## 2018-10-03 ENCOUNTER — Ambulatory Visit (INDEPENDENT_AMBULATORY_CARE_PROVIDER_SITE_OTHER): Payer: Medicare HMO

## 2018-10-03 ENCOUNTER — Other Ambulatory Visit: Payer: Self-pay

## 2018-10-03 DIAGNOSIS — G4733 Obstructive sleep apnea (adult) (pediatric): Secondary | ICD-10-CM | POA: Diagnosis not present

## 2018-10-03 DIAGNOSIS — Z23 Encounter for immunization: Secondary | ICD-10-CM | POA: Diagnosis not present

## 2018-10-22 DIAGNOSIS — R69 Illness, unspecified: Secondary | ICD-10-CM | POA: Diagnosis not present

## 2018-10-23 DIAGNOSIS — H2511 Age-related nuclear cataract, right eye: Secondary | ICD-10-CM | POA: Diagnosis not present

## 2018-10-23 DIAGNOSIS — Z961 Presence of intraocular lens: Secondary | ICD-10-CM | POA: Diagnosis not present

## 2018-10-23 DIAGNOSIS — H11441 Conjunctival cysts, right eye: Secondary | ICD-10-CM | POA: Diagnosis not present

## 2018-10-23 DIAGNOSIS — H02403 Unspecified ptosis of bilateral eyelids: Secondary | ICD-10-CM | POA: Diagnosis not present

## 2018-10-29 DIAGNOSIS — G4733 Obstructive sleep apnea (adult) (pediatric): Secondary | ICD-10-CM | POA: Diagnosis not present

## 2018-10-30 DIAGNOSIS — H02413 Mechanical ptosis of bilateral eyelids: Secondary | ICD-10-CM | POA: Diagnosis not present

## 2018-10-30 DIAGNOSIS — H02423 Myogenic ptosis of bilateral eyelids: Secondary | ICD-10-CM | POA: Diagnosis not present

## 2018-10-30 DIAGNOSIS — H0279 Other degenerative disorders of eyelid and periocular area: Secondary | ICD-10-CM | POA: Diagnosis not present

## 2018-10-30 DIAGNOSIS — H02831 Dermatochalasis of right upper eyelid: Secondary | ICD-10-CM | POA: Diagnosis not present

## 2018-10-30 DIAGNOSIS — H02832 Dermatochalasis of right lower eyelid: Secondary | ICD-10-CM | POA: Diagnosis not present

## 2018-10-30 DIAGNOSIS — H02834 Dermatochalasis of left upper eyelid: Secondary | ICD-10-CM | POA: Diagnosis not present

## 2018-10-30 DIAGNOSIS — H53483 Generalized contraction of visual field, bilateral: Secondary | ICD-10-CM | POA: Diagnosis not present

## 2018-10-30 DIAGNOSIS — H02835 Dermatochalasis of left lower eyelid: Secondary | ICD-10-CM | POA: Diagnosis not present

## 2018-11-19 DIAGNOSIS — H02413 Mechanical ptosis of bilateral eyelids: Secondary | ICD-10-CM | POA: Diagnosis not present

## 2018-11-19 DIAGNOSIS — H02834 Dermatochalasis of left upper eyelid: Secondary | ICD-10-CM | POA: Diagnosis not present

## 2018-11-19 DIAGNOSIS — H02835 Dermatochalasis of left lower eyelid: Secondary | ICD-10-CM | POA: Diagnosis not present

## 2018-11-19 DIAGNOSIS — H02831 Dermatochalasis of right upper eyelid: Secondary | ICD-10-CM | POA: Diagnosis not present

## 2018-11-19 DIAGNOSIS — H53483 Generalized contraction of visual field, bilateral: Secondary | ICD-10-CM | POA: Diagnosis not present

## 2018-11-19 DIAGNOSIS — H02423 Myogenic ptosis of bilateral eyelids: Secondary | ICD-10-CM | POA: Diagnosis not present

## 2018-11-19 DIAGNOSIS — H0279 Other degenerative disorders of eyelid and periocular area: Secondary | ICD-10-CM | POA: Diagnosis not present

## 2018-11-19 DIAGNOSIS — H02832 Dermatochalasis of right lower eyelid: Secondary | ICD-10-CM | POA: Diagnosis not present

## 2018-11-29 DIAGNOSIS — G4733 Obstructive sleep apnea (adult) (pediatric): Secondary | ICD-10-CM | POA: Diagnosis not present

## 2018-12-17 DIAGNOSIS — Z1231 Encounter for screening mammogram for malignant neoplasm of breast: Secondary | ICD-10-CM | POA: Diagnosis not present

## 2018-12-17 DIAGNOSIS — Z803 Family history of malignant neoplasm of breast: Secondary | ICD-10-CM | POA: Diagnosis not present

## 2018-12-17 LAB — HM MAMMOGRAPHY

## 2018-12-18 ENCOUNTER — Encounter: Payer: Self-pay | Admitting: *Deleted

## 2018-12-21 DIAGNOSIS — G4733 Obstructive sleep apnea (adult) (pediatric): Secondary | ICD-10-CM | POA: Diagnosis not present

## 2018-12-25 DIAGNOSIS — R69 Illness, unspecified: Secondary | ICD-10-CM | POA: Diagnosis not present

## 2018-12-29 DIAGNOSIS — G4733 Obstructive sleep apnea (adult) (pediatric): Secondary | ICD-10-CM | POA: Diagnosis not present

## 2019-02-20 ENCOUNTER — Other Ambulatory Visit: Payer: Self-pay | Admitting: Family Medicine

## 2019-02-20 MED ORDER — HYDROCHLOROTHIAZIDE 25 MG PO TABS: 12.5000 mg | ORAL_TABLET | Freq: Every day | ORAL | 2 refills | Status: DC

## 2019-03-22 ENCOUNTER — Other Ambulatory Visit: Payer: Self-pay | Admitting: Family Medicine

## 2019-03-22 ENCOUNTER — Other Ambulatory Visit: Payer: Self-pay

## 2019-03-22 ENCOUNTER — Other Ambulatory Visit: Payer: Medicare Other

## 2019-03-22 DIAGNOSIS — Z0000 Encounter for general adult medical examination without abnormal findings: Secondary | ICD-10-CM

## 2019-03-23 LAB — COMPREHENSIVE METABOLIC PANEL
AG Ratio: 1.7 (calc) (ref 1.0–2.5)
ALT: 19 U/L (ref 6–29)
AST: 15 U/L (ref 10–35)
Albumin: 4.3 g/dL (ref 3.6–5.1)
Alkaline phosphatase (APISO): 75 U/L (ref 37–153)
BUN: 15 mg/dL (ref 7–25)
CO2: 24 mmol/L (ref 20–32)
Calcium: 9.2 mg/dL (ref 8.6–10.4)
Chloride: 99 mmol/L (ref 98–110)
Creat: 0.63 mg/dL (ref 0.60–0.93)
Globulin: 2.6 g/dL (calc) (ref 1.9–3.7)
Glucose, Bld: 93 mg/dL (ref 65–99)
Potassium: 4.4 mmol/L (ref 3.5–5.3)
Sodium: 134 mmol/L — ABNORMAL LOW (ref 135–146)
Total Bilirubin: 0.5 mg/dL (ref 0.2–1.2)
Total Protein: 6.9 g/dL (ref 6.1–8.1)

## 2019-03-23 LAB — CBC WITH DIFFERENTIAL/PLATELET
Absolute Monocytes: 410 cells/uL (ref 200–950)
Basophils Absolute: 63 cells/uL (ref 0–200)
Basophils Relative: 1 %
Eosinophils Absolute: 208 cells/uL (ref 15–500)
Eosinophils Relative: 3.3 %
HCT: 39.9 % (ref 35.0–45.0)
Hemoglobin: 13.6 g/dL (ref 11.7–15.5)
Lymphs Abs: 2381 cells/uL (ref 850–3900)
MCH: 30.6 pg (ref 27.0–33.0)
MCHC: 34.1 g/dL (ref 32.0–36.0)
MCV: 89.7 fL (ref 80.0–100.0)
MPV: 9.8 fL (ref 7.5–12.5)
Monocytes Relative: 6.5 %
Neutro Abs: 3238 cells/uL (ref 1500–7800)
Neutrophils Relative %: 51.4 %
Platelets: 280 10*3/uL (ref 140–400)
RBC: 4.45 10*6/uL (ref 3.80–5.10)
RDW: 12.6 % (ref 11.0–15.0)
Total Lymphocyte: 37.8 %
WBC: 6.3 10*3/uL (ref 3.8–10.8)

## 2019-03-23 LAB — LIPID PANEL
Cholesterol: 194 mg/dL (ref <200)
HDL: 60 mg/dL (ref >=50)
LDL Cholesterol (Calc): 97 mg/dL (calc)
Non-HDL Cholesterol (Calc): 134 mg/dL (calc) — ABNORMAL HIGH (ref <130)
Total CHOL/HDL Ratio: 3.2 (calc) (ref <5.0)
Triglycerides: 243 mg/dL — ABNORMAL HIGH (ref <150)

## 2019-03-23 LAB — VITAMIN D 25 HYDROXY (VIT D DEFICIENCY, FRACTURES): Vit D, 25-Hydroxy: 28 ng/mL — ABNORMAL LOW (ref 30–100)

## 2019-03-26 ENCOUNTER — Encounter: Payer: Self-pay | Admitting: Family Medicine

## 2019-03-26 ENCOUNTER — Other Ambulatory Visit: Payer: Self-pay

## 2019-03-26 ENCOUNTER — Ambulatory Visit (INDEPENDENT_AMBULATORY_CARE_PROVIDER_SITE_OTHER): Payer: Medicare Other | Admitting: Family Medicine

## 2019-03-26 VITALS — BP 136/88 | HR 70 | Temp 97.6°F | Resp 16 | Ht 64.5 in | Wt 195.0 lb

## 2019-03-26 DIAGNOSIS — M19041 Primary osteoarthritis, right hand: Secondary | ICD-10-CM

## 2019-03-26 DIAGNOSIS — R2681 Unsteadiness on feet: Secondary | ICD-10-CM | POA: Diagnosis not present

## 2019-03-26 DIAGNOSIS — M19042 Primary osteoarthritis, left hand: Secondary | ICD-10-CM

## 2019-03-26 DIAGNOSIS — Z13220 Encounter for screening for lipoid disorders: Secondary | ICD-10-CM

## 2019-03-26 DIAGNOSIS — Z0000 Encounter for general adult medical examination without abnormal findings: Secondary | ICD-10-CM

## 2019-03-26 DIAGNOSIS — Z0001 Encounter for general adult medical examination with abnormal findings: Secondary | ICD-10-CM

## 2019-03-26 MED ORDER — VITAMIN D (ERGOCALCIFEROL) 1.25 MG (50000 UNIT) PO CAPS: 50000.0000 [IU] | ORAL_CAPSULE | ORAL | 5 refills | Status: DC

## 2019-03-26 NOTE — Progress Notes (Signed)
Subjective:    Patient ID: Lynn Estes, female    DOB: Dec 20, 1945, 74 y.o.   MRN: [id number]  HPI Patient is here today for complete physical exam.  She suffers from osteoarthritis in her hands and her knees and her ankles and also has some residual pain in her ankle after her ankle fracture surgical repair.  As result she has been more inactive and sedentary coupled with the Covid pandemic.  This is resulted in weight gain.  She admits to over indulging in junk food.  She is interested in methods to achieve weight loss.  She denies any memory loss.  She denies any falls.  She denies any depression.  Overall she is doing extremely well with no concerns.  Her mammogram is up-to-date.  She gets her Pap smear performed at her gynecologist.  Her last colonoscopy was 9 years ago and is due again next year.  Her last bone density showed mild osteopenia in 2019 and is not due again until next year.  Otherwise she is doing well with no concerns. Past Medical History:  Diagnosis Date  . Asthma   . Cataract   . Diverticulitis   . History of shingles   . Hypertension   . Lung nodules    Nodules noted on CT scan July 2017  . OSA (obstructive sleep apnea)   . Partial small bowel obstruction [facility])    Past Surgical History:  Procedure Laterality Date  . ANKLE SURGERY    . CATARACT EXTRACTION W/PHACO Left 06/08/2015   Procedure: CATARACT EXTRACTION PHACO AND INTRAOCULAR LENS PLACEMENT LEFT EYE CDE=7.18;  Surgeon: Williams Che, MD;  Location: [facility];  Service: Ophthalmology;  Laterality: Left;  . DILATION AND CURETTAGE OF UTERUS     X 2  . EXERCISE TOLERANCE TEST (GXT)  11/2016   Low Exercise Tolerance - 6:24 min (dyspnea, fatigue), upsloping ST segment depression noted.  Nondiagnostic.  HR 150 bpm = 97% MPHR). Hypertensive response to exercise. NEGATIVE test.  No arrhythmias.  . right rotator cuff surgery    . TRANSTHORACIC ECHOCARDIOGRAM  11/2016   Normal LV size and function.  GR 1 DD.   Moderate concentric hypertrophy.  Vigorous LV function with EF of 65-70%.   Current Outpatient Medications on File Prior to Visit  Medication Sig Dispense Refill  . Cholecalciferol (VITAMIN D PO) Take by mouth.    . Cyanocobalamin (VITAMIN B 12 PO) Take by mouth.    . diclofenac (VOLTAREN) 75 MG EC tablet TAKE ONE TABLET BY MOUTH TWICE DAILY  30 tablet 0  . Fluocinolone Acetonide Scalp 0.01 % OIL Apply topically.    . hydrochlorothiazide (HYDRODIURIL) 25 MG tablet Take 0.5 tablets (12.5 mg total) by mouth daily. 90 tablet 2  . losartan (COZAAR) 50 MG tablet Take 50 mg daily by mouth.    . Melatonin 5 MG TABS Take by mouth.    . metoprolol tartrate (LOPRESSOR) 25 MG tablet TAKE ONE TABLET BY MOUTH ONE TIME DAILY  90 tablet 2  . Omega-3 Fatty Acids (FISH OIL) 1000 MG CAPS Fish Oil    . triamcinolone (NASACORT) 55 MCG/ACT AERO nasal inhaler Place 2 sprays into the nose daily. 1 Inhaler 12  . zolpidem (AMBIEN) 10 MG tablet Take 1 tablet (10 mg total) by mouth at bedtime as needed for sleep. 15 tablet 1   No current facility-administered medications on file prior to visit.   No Known Allergies Social History   Socioeconomic History  . Marital status:  Married    Spouse name: Not on file  . Number of children: 2  . Years of education: 6  . Highest education level: Not on file  Occupational History  . Occupation: Medical illustrator    Comment: Chief Executive Officer  Tobacco Use  . Smoking status: Never Smoker  . Smokeless tobacco: Never Used  Substance and Sexual Activity  . Alcohol use: Yes    Alcohol/week: 5.0 standard drinks    Types: 5 Glasses of wine per week  . Drug use: No  . Sexual activity: Not on file  Other Topics Concern  . Not on file  Social History Narrative   PFT---08-20-2009   fev1-2.04, 92%, fev1/fvc 0.79   Widowed, remarried.  2 kids   Works part time Naval architect    Social Determinants of Radio broadcast assistant Strain:   .  Difficulty of Paying Living Expenses:   Food Insecurity:   . Worried About Charity fundraiser in the Last Year:   . Arboriculturist in the Last Year:   Transportation Needs:   . Film/video editor (Medical):   Marland Kitchen Lack of Transportation (Non-Medical):   Physical Activity:   . Days of Exercise per Week:   . Minutes of Exercise per Session:   Stress:   . Feeling of Stress :   Social Connections:   . Frequency of Communication with Friends and Family:   . Frequency of Social Gatherings with Friends and Family:   . Attends Religious Services:   . Active Member of Clubs or Organizations:   . Attends Archivist Meetings:   Marland Kitchen Marital Status:   Intimate Partner Violence:   . Fear of Current or Ex-Partner:   . Emotionally Abused:   Marland Kitchen Physically Abused:   . Sexually Abused:    Family History  Problem Relation Age of Onset  . Hypertension Brother       Review of Systems  All other systems reviewed and are negative.      Objective:   Physical Exam Vitals reviewed.  Constitutional:      General: She is not in acute distress.    Appearance: She is well-developed. She is not diaphoretic.  HENT:     Head: Normocephalic and atraumatic.     Right Ear: External ear normal.     Left Ear: External ear normal.     Nose: Nose normal.     Mouth/Throat:     Pharynx: No oropharyngeal exudate.  Eyes:     General: No scleral icterus.       Right eye: No discharge.        Left eye: No discharge.     Conjunctiva/sclera: Conjunctivae normal.     Pupils: Pupils are equal, round, and reactive to light.  Neck:     Thyroid: No thyromegaly.     Vascular: No JVD.     Trachea: No tracheal deviation.  Cardiovascular:     Rate and Rhythm: Normal rate and regular rhythm.     Heart sounds: Normal heart sounds. No murmur. No friction rub. No gallop.   Pulmonary:     Effort: Pulmonary effort is normal. No respiratory distress.     Breath sounds: Normal breath sounds. No stridor. No  wheezing or rales.  Chest:     Chest wall: No tenderness.  Abdominal:     General: Bowel sounds are normal. There is no distension.     Palpations: Abdomen is soft. There is  no mass.     Tenderness: There is no abdominal tenderness. There is no guarding or rebound.     Hernia: No hernia is present.  Musculoskeletal:     Cervical back: Normal range of motion.     Lumbar back: No spasms or tenderness. Normal range of motion.     Right lower leg: No edema.     Left lower leg: No edema.  Lymphadenopathy:     Cervical: No cervical adenopathy.  Skin:    General: Skin is warm.     Coloration: Skin is not jaundiced or pale.     Findings: No bruising, erythema, lesion or rash.  Neurological:     General: No focal deficit present.     Mental Status: She is oriented to person, place, and time. Mental status is at baseline.     Cranial Nerves: No cranial nerve deficit.     Sensory: No sensory deficit.     Motor: No weakness.     Coordination: Coordination normal.     Gait: Gait normal.     Deep Tendon Reflexes: Reflexes normal.  Psychiatric:        Mood and Affect: Mood normal.        Behavior: Behavior normal.        Thought Content: Thought content normal.        Judgment: Judgment normal.           Assessment & Plan:  General medical exam  Gait instability  Screening cholesterol level  Primary osteoarthritis of both hands  Lab on 03/22/2019  Component Date Value Ref Range Status  . Glucose, Bld 03/22/2019 93  65 - 99 mg/dL Final   Comment: .            Fasting reference interval .   . BUN 03/22/2019 15  7 - 25 mg/dL Final  . Creat 03/22/2019 0.63  0.60 - 0.93 mg/dL Final   Comment: For patients >47 years of age, the reference limit for Creatinine is approximately 13% higher for people identified as African-American. .   Havery Moros Ratio [id number] NOT APPLICABLE  6 - 22 (calc) Final  . Sodium 03/22/2019 134* 135 - 146 mmol/L Final  . Potassium 03/22/2019  4.4  3.5 - 5.3 mmol/L Final  . Chloride 03/22/2019 99  98 - 110 mmol/L Final  . CO2 03/22/2019 24  20 - 32 mmol/L Final  . Calcium 03/22/2019 9.2  8.6 - 10.4 mg/dL Final  . Total Protein 03/22/2019 6.9  6.1 - 8.1 g/dL Final  . Albumin 03/22/2019 4.3  3.6 - 5.1 g/dL Final  . Globulin 03/22/2019 2.6  1.9 - 3.7 g/dL (calc) Final  . AG Ratio 03/22/2019 1.7  1.0 - 2.5 (calc) Final  . Total Bilirubin 03/22/2019 0.5  0.2 - 1.2 mg/dL Final  . Alkaline phosphatase (APISO) 03/22/2019 75  37 - 153 U/L Final  . AST 03/22/2019 15  10 - 35 U/L Final  . ALT 03/22/2019 19  6 - 29 U/L Final  . WBC 03/22/2019 6.3  3.8 - 10.8 Thousand/uL Final  . RBC 03/22/2019 4.45  3.80 - 5.10 Million/uL Final  . Hemoglobin 03/22/2019 13.6  11.7 - 15.5 g/dL Final  . HCT 03/22/2019 39.9  35.0 - 45.0 % Final  . MCV 03/22/2019 89.7  80.0 - 100.0 fL Final  . MCH 03/22/2019 30.6  27.0 - 33.0 pg Final  . MCHC 03/22/2019 34.1  32.0 - 36.0 g/dL Final  . RDW 03/22/2019 12.6  11.0 - 15.0 % Final  . Platelets 03/22/2019 280  140 - 400 Thousand/uL Final  . MPV 03/22/2019 9.8  7.5 - 12.5 fL Final  . Neutro Abs 03/22/2019 3,238  1,500 - 7,800 cells/uL Final  . Lymphs Abs 03/22/2019 2,381  850 - 3,900 cells/uL Final  . Absolute Monocytes 03/22/2019 410  200 - 950 cells/uL Final  . Eosinophils Absolute 03/22/2019 208  15 - 500 cells/uL Final  . Basophils Absolute 03/22/2019 63  0 - 200 cells/uL Final  . Neutrophils Relative % 03/22/2019 51.4  % Final  . Total Lymphocyte 03/22/2019 37.8  % Final  . Monocytes Relative 03/22/2019 6.5  % Final  . Eosinophils Relative 03/22/2019 3.3  % Final  . Basophils Relative 03/22/2019 1.0  % Final  . Cholesterol 03/22/2019 194  <200 mg/dL Final  . HDL 03/22/2019 60  > OR = 50 mg/dL Final  . Triglycerides 03/22/2019 243* <150 mg/dL Final   Comment: . If a non-fasting specimen was collected, consider repeat triglyceride testing on a fasting specimen if clinically indicated.  Yates Decamp et al. J.  of Clin. Lipidol. [id number]. .   . LDL Cholesterol (Calc) 03/22/2019 97  mg/dL (calc) Final   Comment: Reference range: <100 . Desirable range <100 mg/dL for primary prevention;   <70 mg/dL for patients with CHD or diabetic patients  with > or = 2 CHD risk factors. Marland Kitchen LDL-C is now calculated using the Martin-Hopkins  calculation, which is a validated novel method providing  better accuracy than the Friedewald equation in the  estimation of LDL-C.  Cresenciano Genre et al. Annamaria Helling. [id number]): 2061-2068  (http://education.[facility].com/faq/FAQ164)   . Total CHOL/HDL Ratio 03/22/2019 3.2  <5.0 (calc) Final  . Non-HDL Cholesterol (Calc) 03/22/2019 134* <130 mg/dL (calc) Final   Comment: For patients with diabetes plus 1 major ASCVD risk  factor, treating to a non-HDL-C goal of <100 mg/dL  (LDL-C of <70 mg/dL) is considered a therapeutic  option.   . Vit D, 25-Hydroxy 03/22/2019 28* 30 - 100 ng/mL Final   Comment: Vitamin D Status         25-OH Vitamin D: . Deficiency:                    <20 ng/mL Insufficiency:             20 - 29 ng/mL Optimal:                 > or = 30 ng/mL . For 25-OH Vitamin D testing on patients on  D2-supplementation and patients for whom quantitation  of D2 and D3 fractions is required, the [facility]ure[facility](TM) 25-OH VIT D, (D2,D3), LC/MS/MS is recommended: order  code [phone] (patients >58yrs). See Note 1 . Note 1 . For additional information, please refer to  http://education.[facility].com/faq/FAQ199  (This link is being provided for informational/ educational purposes only.)    Lab work today is outstanding aside from some mild elevations in her triglycerides.  I recommended Weight Watchers or Nutrisystem or some type of dietary plan to help her achieve a 1500-calorie a day diet.  I recommended discontinuation of junk food and fatty foods and eating more fruits and vegetables.  Also recommended 30 minutes a day 5 days a week of aerobic  exercise.  Given her severe osteoarthritis and gait instability, I have recommended water aerobics through the [facility] as I believe this would be the safest option for the patient.  I believe that this would remove the weight from  her joints preventing further pain and also help her build up strength and balance in her lower extremity muscles and help facilitate weight loss.  Mammogram, colonoscopy, and Pap smear are all up-to-date.  Immunizations are up-to-date.  She denies any falls, depression, or memory loss.

## 2019-05-13 ENCOUNTER — Other Ambulatory Visit: Payer: Self-pay | Admitting: Family Medicine

## 2019-05-13 MED ORDER — VITAMIN D (ERGOCALCIFEROL) 1.25 MG (50000 UNIT) PO CAPS: 50000.0000 [IU] | ORAL_CAPSULE | ORAL | 4 refills | Status: DC

## 2019-05-27 ENCOUNTER — Other Ambulatory Visit: Payer: Self-pay | Admitting: Family Medicine

## 2019-06-25 ENCOUNTER — Telehealth: Payer: Self-pay | Admitting: *Deleted

## 2019-06-25 NOTE — Telephone Encounter (Signed)
Called and lmvm for pt that needs appt for cpap supplies possible 06-26-19 at 2:30pm with AL/NP.

## 2019-06-28 ENCOUNTER — Other Ambulatory Visit: Payer: Self-pay | Admitting: Family Medicine

## 2019-07-11 NOTE — Telephone Encounter (Signed)
Phone rep checked office voicemail's at 4:35 pt left message asking to schedule an appointment so she is able to go about ordering her CPAP supplies.

## 2019-07-24 ENCOUNTER — Ambulatory Visit (INDEPENDENT_AMBULATORY_CARE_PROVIDER_SITE_OTHER): Payer: Medicare Other | Admitting: Adult Health

## 2019-07-24 ENCOUNTER — Other Ambulatory Visit: Payer: Self-pay

## 2019-07-24 VITALS — BP 158/86 | HR 84 | Ht 65.0 in | Wt 194.0 lb

## 2019-07-24 DIAGNOSIS — G4733 Obstructive sleep apnea (adult) (pediatric): Secondary | ICD-10-CM

## 2019-07-24 DIAGNOSIS — Z9989 Dependence on other enabling machines and devices: Secondary | ICD-10-CM

## 2019-07-24 NOTE — Progress Notes (Signed)
PATIENT: Lynn Estes DOB: Oct 24, 1945  REASON FOR VISIT: follow up HISTORY FROM: patient  HISTORY OF PRESENT ILLNESS: Today 07/24/19:  [name] is a 74 year old female with a history of obstructive sleep apnea on CPAP.  Her download indicates that she use her machine nightly for compliance of 100%.  She used her machine greater than 4 hours each night.  On average she uses her machine 7 hours and 47 minutes.  Her residual AHI is 0.4 on 5 to 15 cm of water with EPR 3.  Leak in the 95th percentile is 27 L/min.  She states that she does have some fatigue but feels this is related to the pandemic.  She states that sometimes she will wake up at night but she is typically able to go back to sleep in approximately 1 hour.  HISTORY 05/21/18 Lynn Estes is a 74 y.o. female for follow up of OSA on CPAP. She has used CPAP every night but does mention that she has moisture buildup in mask that runs down her cheek. She is using full face mask. She has noted benefit with increased energy but is concerned about leak.   Download report as follows:  04/21/2018 - 05/20/2018 20 - 05/20/2018 Usage days 30/30 days (100%) >= 4 hours 29 days (97%) < 4 hours 1 days (3%) Usage hours 188 hours 8 minutes Average usage (total days) 6 hours 16 minutes Average usage (days used) 6 hours 16 minutes Median usage (days used) 6 hours 13 minutes Total used hours (value since last reset - 05/20/2018) 314 hours AirSense 10 AutoSet Serial number [id number] Mode AutoSet Min Pressure 5 cmH2O Max Pressure 15 cmH2O EPR Fulltime EPR level 3 Response Soft Therapy Pressure - cmH2O Median: 8.1 95th percentile: 10.6 Maximum: 11.6 Leaks - L/min Median: 7.0 95th percentile: 30.9 Maximum: 46.8 Events per hour AI: 0.0 HI: 0.2 AHI: 0.2 Apnea Index Central: 0.0 Obstructive: 0.0 Unknown: 0.0 RERA Index 0.3 Cheyne-Stokes respiration (average duration per night) 0 minutes (0%)  REVIEW OF SYSTEMS: Out of a  complete 14 system review of symptoms, the patient complains only of the following symptoms, and all other reviewed systems are negative.  FSS 48 ESS 4  ALLERGIES: No Known Allergies  HOME MEDICATIONS: Outpatient Medications Prior to Visit  Medication Sig Dispense Refill  . Cholecalciferol (VITAMIN D PO) Take by mouth.    . Cyanocobalamin (VITAMIN B 12 PO) Take by mouth.    . diclofenac (VOLTAREN) 75 MG EC tablet TAKE ONE TABLET BY MOUTH TWICE DAILY  30 tablet 0  . Fluocinolone Acetonide Scalp 0.01 % OIL Apply topically.    . hydrochlorothiazide (HYDRODIURIL) 25 MG tablet Take 0.5 tablets (12.5 mg total) by mouth daily. 90 tablet 2  . Melatonin 5 MG TABS Take by mouth.    . metoprolol tartrate (LOPRESSOR) 25 MG tablet TAKE ONE TABLET BY MOUTH ONE TIME DAILY 90 tablet 2  . Omega-3 Fatty Acids (FISH OIL) 1000 MG CAPS Fish Oil    . triamcinolone (NASACORT) 55 MCG/ACT AERO nasal inhaler Place 2 sprays into the nose daily. 1 Inhaler 12  . Vitamin D, Ergocalciferol, (DRISDOL) 1.25 MG (50000 UNIT) CAPS capsule Take 1 capsule (50,000 Units total) by mouth every 7 (seven) days. 5 capsule 4  . zolpidem (AMBIEN) 10 MG tablet Take 1 tablet (10 mg total) by mouth at bedtime as needed for sleep. 15 tablet 1   No facility-administered medications prior to visit.    PAST MEDICAL HISTORY: Past Medical History:  Diagnosis Date  . Asthma   . Cataract   . Diverticulitis   . History of shingles   . Hypertension   . Lung nodules    Nodules noted on CT scan July 2017  . OSA (obstructive sleep apnea)   . Partial small bowel obstruction ([place])     PAST SURGICAL HISTORY: Past Surgical History:  Procedure Laterality Date  . ANKLE SURGERY    . CATARACT EXTRACTION W/PHACO Left 06/08/2015   Procedure: CATARACT EXTRACTION PHACO AND INTRAOCULAR LENS PLACEMENT LEFT EYE CDE=7.18;  Surgeon: Williams Che, MD;  Location: [facility];  Service: Ophthalmology;  Laterality: Left;  . DILATION AND CURETTAGE OF  UTERUS     X 2  . EXERCISE TOLERANCE TEST (GXT)  11/2016   Low Exercise Tolerance - 6:24 min (dyspnea, fatigue), upsloping ST segment depression noted.  Nondiagnostic.  HR 150 bpm = 97% MPHR). Hypertensive response to exercise. NEGATIVE test.  No arrhythmias.  . right rotator cuff surgery    . TRANSTHORACIC ECHOCARDIOGRAM  11/2016   Normal LV size and function.  GR 1 DD.  Moderate concentric hypertrophy.  Vigorous LV function with EF of 65-70%.    FAMILY HISTORY: Family History  Problem Relation Age of Onset  . Hypertension Brother     SOCIAL HISTORY: Social History   Socioeconomic History  . Marital status: Married    Spouse name: Not on file  . Number of children: 2  . Years of education: 34  . Highest education level: Not on file  Occupational History  . Occupation: Medical illustrator    Comment: Chief Executive Officer  Tobacco Use  . Smoking status: Never Smoker  . Smokeless tobacco: Never Used  Vaping Use  . Vaping Use: Never used  Substance and Sexual Activity  . Alcohol use: Yes    Alcohol/week: 5.0 standard drinks    Types: 5 Glasses of wine per week  . Drug use: No  . Sexual activity: Not on file  Other Topics Concern  . Not on file  Social History Narrative   PFT---08-20-2009   fev1-2.04, 92%, fev1/fvc 0.79   Widowed, remarried.  2 kids   Works part time Naval architect    Social Determinants of Radio broadcast assistant Strain:   . Difficulty of Paying Living Expenses:   Food Insecurity:   . Worried About Charity fundraiser in the Last Year:   . Arboriculturist in the Last Year:   Transportation Needs:   . Film/video editor (Medical):   Marland Kitchen Lack of Transportation (Non-Medical):   Physical Activity:   . Days of Exercise per Week:   . Minutes of Exercise per Session:   Stress:   . Feeling of Stress :   Social Connections:   . Frequency of Communication with Friends and Family:   . Frequency of Social Gatherings with Friends  and Family:   . Attends Religious Services:   . Active Member of Clubs or Organizations:   . Attends Archivist Meetings:   Marland Kitchen Marital Status:   Intimate Partner Violence:   . Fear of Current or Ex-Partner:   . Emotionally Abused:   Marland Kitchen Physically Abused:   . Sexually Abused:       PHYSICAL EXAM  Vitals:   07/24/19 1435  BP: (!) 158/86  Pulse: 84  Weight: 194 lb (88 kg)  Height: 5\' 5"  (1.651 m)   Body mass index is 32.28 kg/m.  Generalized: Well developed, in  no acute distress  Chest: Lungs clear to auscultation bilaterally  Neurological examination  Mentation: Alert oriented to time, place, history taking. Follows all commands speech and language fluent Cranial nerve II-XII: Extraocular movements were full, visual field were full on confrontational test Head turning and shoulder shrug  were normal and symmetric. Motor: The motor testing reveals 5 over 5 strength of all 4 extremities. Good symmetric motor tone is noted throughout.  Sensory: Sensory testing is intact to soft touch on all 4 extremities. No evidence of extinction is noted.  Gait and station: Gait is normal.    DIAGNOSTIC DATA (LABS, IMAGING, TESTING) - I reviewed patient records, labs, notes, testing and imaging myself where available.  Lab Results  Component Value Date   WBC 6.3 03/22/2019   HGB 13.6 03/22/2019   HCT 39.9 03/22/2019   MCV 89.7 03/22/2019   PLT 280 03/22/2019      Component Value Date/Time   NA 134 (L) 03/22/2019 0920   K 4.4 03/22/2019 0920   CL 99 03/22/2019 0920   CO2 24 03/22/2019 0920   GLUCOSE 93 03/22/2019 0920   BUN 15 03/22/2019 0920   CREATININE 0.63 03/22/2019 0920   CALCIUM 9.2 03/22/2019 0920   PROT 6.9 03/22/2019 0920   ALBUMIN 4.0 07/30/2014 2013   AST 15 03/22/2019 0920   ALT 19 03/22/2019 0920   ALKPHOS 67 07/30/2014 2013   BILITOT 0.5 03/22/2019 0920   GFRNONAA 94 01/09/2018 0827   GFRAA 109 01/09/2018 0827   Lab Results  Component Value Date     CHOL 194 03/22/2019   HDL 60 03/22/2019   LDLCALC 97 03/22/2019   TRIG 243 (H) 03/22/2019   CHOLHDL 3.2 03/22/2019   No results found for: HGBA1C No results found for: VITAMINB12     ASSESSMENT AND PLAN 74 y.o. year old female  has a past medical history of Asthma, Cataract, Diverticulitis, History of shingles, Hypertension, Lung nodules, OSA (obstructive sleep apnea), and Partial small bowel obstruction ([place]). here with:  1. OSA on CPAP  - CPAP compliance excellent - Good treatment of AHI  - Encourage patient to use CPAP nightly and > 4 hours each night - F/U in 1 year or sooner if needed   I spent 20 minutes of face-to-face and non-face-to-face time with patient.  This included previsit chart review, lab review, study review, order entry, electronic health record documentation, patient education.  Ward Givens, MSN, NP-C 07/24/2019, 3:01 PM [facility] [address], [place], [place] [address] [phone]

## 2019-11-29 ENCOUNTER — Other Ambulatory Visit: Payer: Self-pay | Admitting: Family Medicine

## 2019-12-02 ENCOUNTER — Other Ambulatory Visit: Payer: Self-pay | Admitting: Family Medicine

## 2020-02-21 ENCOUNTER — Encounter: Payer: Self-pay | Admitting: Internal Medicine

## 2020-02-28 ENCOUNTER — Other Ambulatory Visit: Payer: Self-pay | Admitting: Family Medicine

## 2020-03-03 ENCOUNTER — Other Ambulatory Visit: Payer: Self-pay | Admitting: Obstetrics and Gynecology

## 2020-03-03 DIAGNOSIS — R928 Other abnormal and inconclusive findings on diagnostic imaging of breast: Secondary | ICD-10-CM

## 2020-03-18 ENCOUNTER — Ambulatory Visit
Admission: RE | Admit: 2020-03-18 | Discharge: 2020-03-18 | Disposition: A | Discharge status: Home / Self Care | Payer: Medicare Other | Source: Ambulatory Visit | Attending: Obstetrics and Gynecology | Admitting: Obstetrics and Gynecology

## 2020-03-18 ENCOUNTER — Other Ambulatory Visit: Payer: Self-pay | Admitting: Obstetrics and Gynecology

## 2020-03-18 ENCOUNTER — Other Ambulatory Visit: Payer: Self-pay

## 2020-03-18 DIAGNOSIS — N6001 Solitary cyst of right breast: Secondary | ICD-10-CM

## 2020-03-18 DIAGNOSIS — R928 Other abnormal and inconclusive findings on diagnostic imaging of breast: Secondary | ICD-10-CM

## 2020-03-23 ENCOUNTER — Other Ambulatory Visit: Payer: Self-pay

## 2020-03-23 ENCOUNTER — Ambulatory Visit (AMBULATORY_SURGERY_CENTER): Payer: Medicare Other

## 2020-03-23 VITALS — Ht 65.0 in | Wt 193.0 lb

## 2020-03-23 DIAGNOSIS — Z1211 Encounter for screening for malignant neoplasm of colon: Secondary | ICD-10-CM

## 2020-03-23 MED ORDER — PLENVU 140 G PO SOLR: 1.0000 | ORAL | 0 refills | Status: DC

## 2020-03-23 NOTE — Progress Notes (Signed)
Pre visit completed via phone call;  Patient verified name, DOB, and address;  No egg or soy allergy known to patient  No issues with past sedation with any surgeries or procedures Patient denies ever being told they had issues or difficulty with intubation  No FH of Malignant Hyperthermia No diet pills per patient No home 02 use per patient  No blood thinners per patient  Pt denies issues with constipation  No A fib or A flutter  COVID 19 guidelines implemented in PV today with Pt and RN  Coupon given to pt in PV today , Code to Pharmacy and  NO PA's for preps discussed with pt In PV today  Discussed with pt there will be an out-of-pocket cost for prep and that varies from $0 to 70 dollars  Due to the COVID-19 pandemic we are asking patients to follow certain guidelines.   Pt aware of COVID protocols and [facility] guidelines

## 2020-03-27 ENCOUNTER — Encounter: Payer: Medicare Other | Admitting: Family Medicine

## 2020-03-30 ENCOUNTER — Ambulatory Visit (INDEPENDENT_AMBULATORY_CARE_PROVIDER_SITE_OTHER): Payer: Medicare Other | Admitting: Family Medicine

## 2020-03-30 ENCOUNTER — Other Ambulatory Visit: Payer: Self-pay

## 2020-03-30 ENCOUNTER — Encounter: Payer: Self-pay | Admitting: Family Medicine

## 2020-03-30 VITALS — BP 130/74 | HR 76 | Temp 98.4°F | Resp 16 | Ht 65.0 in | Wt 196.0 lb

## 2020-03-30 DIAGNOSIS — E559 Vitamin D deficiency, unspecified: Secondary | ICD-10-CM

## 2020-03-30 DIAGNOSIS — Z13220 Encounter for screening for lipoid disorders: Secondary | ICD-10-CM | POA: Diagnosis not present

## 2020-03-30 DIAGNOSIS — Z0000 Encounter for general adult medical examination without abnormal findings: Secondary | ICD-10-CM

## 2020-03-30 DIAGNOSIS — G245 Blepharospasm: Secondary | ICD-10-CM

## 2020-03-30 DIAGNOSIS — M5431 Sciatica, right side: Secondary | ICD-10-CM

## 2020-03-30 DIAGNOSIS — Z0001 Encounter for general adult medical examination with abnormal findings: Secondary | ICD-10-CM | POA: Diagnosis not present

## 2020-03-30 MED ORDER — PREDNISONE 20 MG PO TABS: ORAL_TABLET | ORAL | 0 refills | Status: DC

## 2020-03-30 NOTE — Progress Notes (Signed)
Subjective:    Patient ID: Lynn Estes, female    DOB: 1945/10/22, 75 y.o.   MRN: [id number]  HPI Patient is a very sweet 75 year old Caucasian female here today for a physical exam.  She has a few concerns.  First she is complaining of pain radiating down her right leg.  The pain begins around the anterior right hip and then radiates all the way down the leg into her right ankle.  Is a shooting burning type pain.  It sounds neuropathic in nature.  She is also having increasing blinking in both eyes.  This appears to be blepharospasms.  This is per tickly worse in her right eye.  She has no control over it.  Is becoming embarrassing to her.  Third she reports wheezing.  She had significant secondhand smoke exposure from her parents growing up and then her first husband.  She never smoked.  On exam today, she is not wheezing.  However she states that she can hear it occasionally.  She does have a history of COPD on her past medical history although I have never heard her wheeze.  I suspect that she may have some mild bronchospasm secondary to secondhand smoke exposure that may be getting irritated due to seasonal change.  Otherwise she is doing well.  Her mammogram was performed recently.  They recommended a repeat pitcher in 6 months to monitor the cyst.  She is having her colonoscopy next week.  Due to her age she does not require Pap smear.  Her last bone density was in 2019.  Her immunizations are completely up-to-date except for her shingles vaccine which she hesitates to get given the fact she has had shingles twice. Past Medical History:  Diagnosis Date  . Arthritis    generalized-bilateral hands  . Asthma    hx of  . Cataract    bilateral-LEFT eye  . COPD (chronic obstructive pulmonary disease) ([place])    not being tx'd at this time (03/23/2020)  . Diverticulitis   . History of shingles   . Hypertension    on meds  . Lung nodules    Nodules noted on CT scan July 2017  . OSA  (obstructive sleep apnea)   . Partial small bowel obstruction [facility])    Past Surgical History:  Procedure Laterality Date  . ANKLE SURGERY  2019  . CATARACT EXTRACTION W/PHACO Left 06/08/2015   Procedure: CATARACT EXTRACTION PHACO AND INTRAOCULAR LENS PLACEMENT LEFT EYE CDE=7.18;  Surgeon: Williams Che, MD;  Location: [facility];  Service: Ophthalmology;  Laterality: Left;  . DILATION AND CURETTAGE OF UTERUS     X 2  . EXERCISE TOLERANCE TEST (GXT)  11/2016   Low Exercise Tolerance - 6:24 min (dyspnea, fatigue), upsloping ST segment depression noted.  Nondiagnostic.  HR 150 bpm = 97% MPHR). Hypertensive response to exercise. NEGATIVE test.  No arrhythmias.  . right rotator cuff surgery    . TRANSTHORACIC ECHOCARDIOGRAM  11/2016   Normal LV size and function.  GR 1 DD.  Moderate concentric hypertrophy.  Vigorous LV function with EF of 65-70%.  . WISDOM TOOTH EXTRACTION     Current Outpatient Medications on File Prior to Visit  Medication Sig Dispense Refill  . Calcium 500 MG tablet Take 600 mg by mouth 2 (two) times daily.    . Cholecalciferol (VITAMIN D PO) Take 2,000 Units by mouth daily.    . Cyanocobalamin (VITAMIN B 12 PO) Take by mouth.    . diclofenac (VOLTAREN)  75 MG EC tablet TAKE ONE TABLET BY MOUTH TWICE DAILY  (Patient taking differently: daily as needed.) 30 tablet 0  . hydrochlorothiazide (HYDRODIURIL) 25 MG tablet TAKE HALF TABLET BY MOUTH DAILY 90 tablet 0  . Melatonin 5 MG TABS Take by mouth at bedtime as needed.    . metoprolol tartrate (LOPRESSOR) 25 MG tablet TAKE ONE TABLET BY MOUTH ONE TIME DAILY 90 tablet 2  . Omega-3 Fatty Acids (FISH OIL) 1000 MG CAPS Fish Oil    . TURMERIC PO Take by mouth.    . Zinc 50 MG TABS Take by mouth.    . Fluocinolone Acetonide Scalp 0.01 % OIL Apply topically. (Patient not taking: Reported on 03/30/2020)     No current facility-administered medications on file prior to visit.   No Known Allergies Social History   Socioeconomic  History  . Marital status: Married    Spouse name: Not on file  . Number of children: 2  . Years of education: 37  . Highest education level: Not on file  Occupational History  . Occupation: Medical illustrator    Comment: Chief Executive Officer  Tobacco Use  . Smoking status: Never Smoker  . Smokeless tobacco: Never Used  Vaping Use  . Vaping Use: Never used  Substance and Sexual Activity  . Alcohol use: Yes    Alcohol/week: 14.0 standard drinks    Types: 14 Standard drinks or equivalent per week  . Drug use: No  . Sexual activity: Not on file  Other Topics Concern  . Not on file  Social History Narrative   PFT---08-20-2009   fev1-2.04, 92%, fev1/fvc 0.79   Widowed, remarried.  2 kids   Works part time -Doctor, general practice    Social Determinants of Radio broadcast assistant Strain: Not on [employer] Insecurity: Not on file  Transportation Needs: Not on file  Physical Activity: Not on file  Stress: Not on file  Social Connections: Not on file  Intimate Partner Violence: Not on file   Family History  Problem Relation Age of Onset  . Hypertension Brother   . Colon polyps Neg Hx   . Colon cancer Neg Hx   . Esophageal cancer Neg Hx   . Rectal cancer Neg Hx   . Stomach cancer Neg Hx       Review of Systems  All other systems reviewed and are negative.      Objective:   Physical Exam Vitals reviewed.  Constitutional:      General: She is not in acute distress.    Appearance: She is well-developed. She is not diaphoretic.  HENT:     Head: Normocephalic and atraumatic.     Right Ear: External ear normal.     Left Ear: External ear normal.     Nose: Nose normal.     Mouth/Throat:     Pharynx: No oropharyngeal exudate.  Eyes:     General: No scleral icterus.       Right eye: No discharge.        Left eye: No discharge.     Conjunctiva/sclera: Conjunctivae normal.     Pupils: Pupils are equal, round, and reactive to light.  Neck:     Thyroid:  No thyromegaly.     Vascular: No JVD.     Trachea: No tracheal deviation.  Cardiovascular:     Rate and Rhythm: Normal rate and regular rhythm.     Heart sounds: Normal heart sounds. No murmur heard. No friction  rub. No gallop.   Pulmonary:     Effort: Pulmonary effort is normal. No respiratory distress.     Breath sounds: Normal breath sounds. No stridor. No wheezing or rales.  Chest:     Chest wall: No tenderness.  Abdominal:     General: Bowel sounds are normal. There is no distension.     Palpations: Abdomen is soft. There is no mass.     Tenderness: There is no abdominal tenderness. There is no guarding or rebound.     Hernia: No hernia is present.  Musculoskeletal:     Cervical back: Normal range of motion.     Lumbar back: No spasms or tenderness. Normal range of motion.     Right lower leg: No edema.     Left lower leg: No edema.  Lymphadenopathy:     Cervical: No cervical adenopathy.  Skin:    General: Skin is warm.     Coloration: Skin is not jaundiced or pale.     Findings: No bruising, erythema, lesion or rash.  Neurological:     General: No focal deficit present.     Mental Status: She is oriented to person, place, and time. Mental status is at baseline.     Cranial Nerves: No cranial nerve deficit.     Sensory: No sensory deficit.     Motor: No weakness.     Coordination: Coordination normal.     Gait: Gait normal.     Deep Tendon Reflexes: Reflexes normal.  Psychiatric:        Mood and Affect: Mood normal.        Behavior: Behavior normal.        Thought Content: Thought content normal.        Judgment: Judgment normal.           Assessment & Plan:  Screening cholesterol level - Plan: CBC with Differential/Platelet, COMPLETE METABOLIC PANEL WITH GFR, Lipid panel  Vitamin D deficiency - Plan: VITAMIN D 25 Hydroxy (Vit-D Deficiency, Fractures)  General medical exam  Blepharospasm of both eyes  Right sided sciatica  Physical exam today is  normal.  She does not have any pain with range of motion in her right hip joint or her right knee joint.  I am unable to reproduce the pain in her right leg simply with palpation along the femur.  Therefore I suspect that this is neuropathic pain likely secondary to a pinched nerve due to lumbar degenerative disc disease.  We will try empiric dose of prednisone to see if this helps.  If not I would next obtain x-rays of the lumbar spine as well as the femur.  Regarding her blepharospasm, we have delayed treatment for this until after she takes the prednisone.  We can certainly try muscle relaxers to see if this helps her maybe even primidone.  If not improving we could also try Botox injections.  I do not appreciate any wheezing on her exam today however I do suspect that she may have some secondhand smoke exposure lung damage may be even mild COPD.  If the wheezing worsens, she could use albuterol as needed.  I will check a CBC today, CMP, lipid panel, and vitamin D level.  Mammogram, Pap smear, colon cancer screening are all up-to-date or about to be performed.  Bone density will be deferred at the present time.

## 2020-03-31 LAB — CBC WITH DIFFERENTIAL/PLATELET
Absolute Monocytes: 696 cells/uL (ref 200–950)
Basophils Absolute: 94 cells/uL (ref 0–200)
Basophils Relative: 1 %
Eosinophils Absolute: 310 cells/uL (ref 15–500)
Eosinophils Relative: 3.3 %
HCT: 41.3 % (ref 35.0–45.0)
Hemoglobin: 13.8 g/dL (ref 11.7–15.5)
Lymphs Abs: 2275 cells/uL (ref 850–3900)
MCH: 30.1 pg (ref 27.0–33.0)
MCHC: 33.4 g/dL (ref 32.0–36.0)
MCV: 90.2 fL (ref 80.0–100.0)
MPV: 9.7 fL (ref 7.5–12.5)
Monocytes Relative: 7.4 %
Neutro Abs: 6025 cells/uL (ref 1500–7800)
Neutrophils Relative %: 64.1 %
Platelets: 282 10*3/uL (ref 140–400)
RBC: 4.58 10*6/uL (ref 3.80–5.10)
RDW: 12.7 % (ref 11.0–15.0)
Total Lymphocyte: 24.2 %
WBC: 9.4 10*3/uL (ref 3.8–10.8)

## 2020-03-31 LAB — LIPID PANEL
Cholesterol: 184 mg/dL (ref <200)
HDL: 66 mg/dL (ref >=50)
LDL Cholesterol (Calc): 91 mg/dL (calc)
Non-HDL Cholesterol (Calc): 118 mg/dL (calc) (ref <130)
Total CHOL/HDL Ratio: 2.8 (calc) (ref <5.0)
Triglycerides: 168 mg/dL — ABNORMAL HIGH (ref <150)

## 2020-03-31 LAB — COMPLETE METABOLIC PANEL WITH GFR
AG Ratio: 1.6 (calc) (ref 1.0–2.5)
ALT: 26 U/L (ref 6–29)
AST: 16 U/L (ref 10–35)
Albumin: 4.4 g/dL (ref 3.6–5.1)
Alkaline phosphatase (APISO): 72 U/L (ref 37–153)
BUN/Creatinine Ratio: 31 (calc) — ABNORMAL HIGH (ref 6–22)
BUN: 18 mg/dL (ref 7–25)
CO2: 26 mmol/L (ref 20–32)
Calcium: 9.4 mg/dL (ref 8.6–10.4)
Chloride: 100 mmol/L (ref 98–110)
Creat: 0.59 mg/dL — ABNORMAL LOW (ref 0.60–0.93)
GFR, Est African American: 105 mL/min/{1.73_m2} (ref >=60)
GFR, Est Non African American: 91 mL/min/{1.73_m2} (ref >=60)
Globulin: 2.7 g/dL (calc) (ref 1.9–3.7)
Glucose, Bld: 100 mg/dL — ABNORMAL HIGH (ref 65–99)
Potassium: 4.4 mmol/L (ref 3.5–5.3)
Sodium: 136 mmol/L (ref 135–146)
Total Bilirubin: 0.6 mg/dL (ref 0.2–1.2)
Total Protein: 7.1 g/dL (ref 6.1–8.1)

## 2020-03-31 LAB — VITAMIN D 25 HYDROXY (VIT D DEFICIENCY, FRACTURES): Vit D, 25-Hydroxy: 29 ng/mL — ABNORMAL LOW (ref 30–100)

## 2020-04-01 ENCOUNTER — Encounter: Payer: Self-pay | Admitting: *Deleted

## 2020-04-07 ENCOUNTER — Encounter: Payer: Self-pay | Admitting: Internal Medicine

## 2020-04-07 ENCOUNTER — Ambulatory Visit (AMBULATORY_SURGERY_CENTER): Payer: Medicare Other | Admitting: Internal Medicine

## 2020-04-07 ENCOUNTER — Other Ambulatory Visit: Payer: Self-pay

## 2020-04-07 VITALS — BP 134/75 | HR 75 | Temp 96.8°F | Resp 18 | Ht 65.0 in | Wt 193.0 lb

## 2020-04-07 DIAGNOSIS — Z1211 Encounter for screening for malignant neoplasm of colon: Secondary | ICD-10-CM

## 2020-04-07 MED ORDER — SODIUM CHLORIDE 0.9 % IV SOLN: 500.0000 mL | INTRAVENOUS | Status: DC

## 2020-04-07 NOTE — Op Note (Signed)
[place] Patient Name: Lynn Estes Procedure Date: 04/07/2020 11:30 AM MRN: [id number] Endoscopist: Jerene Bears , MD Age: 75 Referring MD:  Date of Birth: 1945-06-01 Gender: Female Account #: [id number] Procedure:                Colonoscopy Indications:              Screening for colorectal malignant neoplasm, Last                            colonoscopy 10 years ago Medicines:                Monitored Anesthesia Care Procedure:                Pre-Anesthesia Assessment:                           - Prior to the procedure, a History and Physical                            was performed, and patient medications and                            allergies were reviewed. The patient's tolerance of                            previous anesthesia was also reviewed. The risks                            and benefits of the procedure and the sedation                            options and risks were discussed with the patient.                            All questions were answered, and informed consent                            was obtained. Prior Anticoagulants: The patient has                            taken no previous anticoagulant or antiplatelet                            agents. ASA Grade Assessment: III - A patient with                            severe systemic disease. After reviewing the risks                            and benefits, the patient was deemed in                            satisfactory condition to undergo the procedure.  After obtaining informed consent, the colonoscope                            was passed under direct vision. Throughout the                            procedure, the patient's blood pressure, pulse, and                            oxygen saturations were monitored continuously. The                            Olympus PCF-[id number] (#[id number]) Colonoscope was                            introduced through the anus and  advanced to the                            cecum, identified by appendiceal orifice and                            ileocecal valve. The colonoscopy was performed                            without difficulty. The patient tolerated the                            procedure well. The quality of the bowel                            preparation was good. The ileocecal valve,                            appendiceal orifice, and rectum were photographed. Scope In: 11:40:14 AM Scope Out: 11:53:35 AM Scope Withdrawal Time: 0 hours 8 minutes 39 seconds  Total Procedure Duration: 0 hours 13 minutes 21 seconds  Findings:                 The digital rectal exam was normal.                           Multiple small and large-mouthed diverticula were                            found from cecum to sigmoid colon.                           Internal hemorrhoids were found during                            retroflexion. The hemorrhoids were small.                           The exam was otherwise without abnormality. Complications:            No immediate complications.  Estimated Blood Loss:     Estimated blood loss: none. Impression:               - Diverticulosis from cecum to sigmoid colon                            (severe in left colon).                           - Internal hemorrhoids.                           - The examination was otherwise normal.                           - No specimens collected. Recommendation:           - Patient has a contact number available for                            emergencies. The signs and symptoms of potential                            delayed complications were discussed with the                            patient. Return to normal activities tomorrow.                            Written discharge instructions were provided to the                            patient.                           - Resume previous diet.                           - Continue present  medications.                           - No repeat colonoscopy due to age at next                            screening interval (in 10 years). Jerene Bears, MD 04/07/2020 11:59:49 AM This report has been signed electronically.

## 2020-04-07 NOTE — Progress Notes (Signed)
PT taken to PACU. Monitors in place. VSS. Report given to RN. 

## 2020-04-07 NOTE — Patient Instructions (Signed)
   Handouts on diverticulosis ,hemorrhoids ,and hemorrhoid banding given to you today    YOU HAD AN ENDOSCOPIC PROCEDURE TODAY AT [place]:   Refer to the procedure report that was given to you for any specific questions about what was found during the examination.  If the procedure report does not answer your questions, please call your gastroenterologist to clarify.  If you requested that your care partner not be given the details of your procedure findings, then the procedure report has been included in a sealed envelope for you to review at your convenience later.  YOU SHOULD EXPECT: Some feelings of bloating in the abdomen. Passage of more gas than usual.  Walking can help get rid of the air that was put into your GI tract during the procedure and reduce the bloating. If you had a lower endoscopy (such as a colonoscopy or flexible sigmoidoscopy) you may notice spotting of blood in your stool or on the toilet paper. If you underwent a bowel prep for your procedure, you may not have a normal bowel movement for a few days.  Please Note:  You might notice some irritation and congestion in your nose or some drainage.  This is from the oxygen used during your procedure.  There is no need for concern and it should clear up in a day or so.  SYMPTOMS TO REPORT IMMEDIATELY:   Following lower endoscopy (colonoscopy or flexible sigmoidoscopy):  Excessive amounts of blood in the stool  Significant tenderness or worsening of abdominal pains  Swelling of the abdomen that is new, acute  Fever of 100F or higher    For urgent or emergent issues, a gastroenterologist can be reached at any hour by calling [phone]. Do not use MyChart messaging for urgent concerns.    DIET:  We do recommend a small meal at first, but then you may proceed to your regular diet.  Drink plenty of fluids but you should avoid alcoholic beverages for 24 hours.  ACTIVITY:  You should plan to take it  easy for the rest of today and you should NOT DRIVE or use heavy machinery until tomorrow (because of the sedation medicines used during the test).    FOLLOW UP: Our staff will call the number listed on your records 48-72 hours following your procedure to check on you and address any questions or concerns that you may have regarding the information given to you following your procedure. If we do not reach you, we will leave a message.  We will attempt to reach you two times.  During this call, we will ask if you have developed any symptoms of COVID 19. If you develop any symptoms (ie: fever, flu-like symptoms, shortness of breath, cough etc.) before then, please call [phone].  If you test positive for Covid 19 in the 2 weeks post procedure, please call and report this information to [place].    If any biopsies were taken you will be contacted by phone or by letter within the next 1-3 weeks.  Please call us at [phone] if you have not heard about the biopsies in 3 weeks.    SIGNATURES/CONFIDENTIALITY: You and/or your care partner have signed paperwork which will be entered into your electronic medical record.  These signatures attest to the fact that that the information above on your After Visit Summary has been reviewed and is understood.  Full responsibility of the confidentiality of this discharge information lies with you and/or your care-partner.

## 2020-04-07 NOTE — Progress Notes (Signed)
Vitals by KW. Pt's states no medical or surgical changes since previsit or office visit. 

## 2020-04-09 ENCOUNTER — Telehealth: Payer: Self-pay

## 2020-04-09 ENCOUNTER — Telehealth: Payer: Self-pay | Admitting: *Deleted

## 2020-04-09 NOTE — Telephone Encounter (Signed)
  Follow up Call-  Call back number 04/07/2020  Post procedure Call Back phone  # [phone]  Permission to leave phone message Yes  Some recent data might be hidden     Patient questions:  Do you have a fever, pain , or abdominal swelling? No. Pain Score  0 *  Have you tolerated food without any problems? Yes.    Have you been able to return to your normal activities? Yes.    Do you have any questions about your discharge instructions: Diet   No. Medications  No. Follow up visit  No.  Do you have questions or concerns about your Care? No.  Actions: * If pain score is 4 or above: No action needed, pain <4.   1. Have you developed a fever since your procedure? no  2.   Have you had an respiratory symptoms (SOB or cough) since your procedure? no  3.   Have you tested positive for COVID 19 since your procedure no  4.   Have you had any family members/close contacts diagnosed with the COVID 19 since your procedure?  no   If yes to any of these questions please route to Joylene John, RN and Joella Prince, RN

## 2020-04-09 NOTE — Telephone Encounter (Signed)
Attempted F/U call. No answer left VM.

## 2020-04-27 ENCOUNTER — Other Ambulatory Visit: Payer: Self-pay

## 2020-04-27 MED ORDER — HYDROCHLOROTHIAZIDE 25 MG PO TABS: 25.0000 mg | ORAL_TABLET | Freq: Every day | ORAL | 0 refills | Status: DC

## 2020-05-13 ENCOUNTER — Telehealth: Payer: Self-pay | Admitting: *Deleted

## 2020-05-13 NOTE — Telephone Encounter (Signed)
Received VM from patient.   Reports that she has continued to have twitching in eye- R>L. States that she would like to proceed with Botox.   Per chart notes: Regarding her blepharospasm, we have delayed treatment for this until after she takes the prednisone.  We can certainly try muscle relaxers to see if this helps her maybe even primidone.  If not improving we could also try Botox injections.  Please advise.

## 2020-05-14 ENCOUNTER — Other Ambulatory Visit: Payer: Self-pay | Admitting: Family Medicine

## 2020-05-14 DIAGNOSIS — G514 Facial myokymia: Secondary | ICD-10-CM

## 2020-05-14 NOTE — Telephone Encounter (Signed)
I will place order to opthalmology

## 2020-05-14 NOTE — Telephone Encounter (Signed)
Call placed to patient and patient made aware.  

## 2020-05-28 ENCOUNTER — Telehealth: Payer: Self-pay | Admitting: Family Medicine

## 2020-05-28 ENCOUNTER — Other Ambulatory Visit: Payer: Self-pay | Admitting: Family Medicine

## 2020-05-28 NOTE — Telephone Encounter (Signed)
Pt called in stating she received a bill from [facility] to retrieve some info about the bill, but had to lvm.   Cb#:[phone]

## 2020-06-09 ENCOUNTER — Other Ambulatory Visit: Payer: Self-pay

## 2020-06-09 ENCOUNTER — Telehealth: Payer: Self-pay

## 2020-06-09 DIAGNOSIS — G514 Facial myokymia: Secondary | ICD-10-CM

## 2020-06-09 NOTE — Telephone Encounter (Signed)
Pt called  To chk on eye referral. Pt did not receive calls. OG referral closed. Placed another referral for pt with note to contact home line only

## 2020-06-16 NOTE — Telephone Encounter (Signed)
Sent email to [facility] [place] to get some help on bill.

## 2020-07-22 ENCOUNTER — Telehealth: Payer: Self-pay | Admitting: Adult Health

## 2020-07-22 NOTE — Telephone Encounter (Signed)
Returned pt call, waiting on advice from [facility]

## 2020-07-22 NOTE — Telephone Encounter (Signed)
Patient requesting call back regarding some CPAP questions she would like answered before her appointment tomorrow. Please advise.

## 2020-07-22 NOTE — Telephone Encounter (Signed)
Contacted pt back, informed her that Lynn Estes will still see her although she has not been using CPAP. They will discuss other options for CPAP at appointment.

## 2020-07-23 ENCOUNTER — Ambulatory Visit (INDEPENDENT_AMBULATORY_CARE_PROVIDER_SITE_OTHER): Payer: Medicare Other | Admitting: Adult Health

## 2020-07-23 ENCOUNTER — Other Ambulatory Visit: Payer: Self-pay

## 2020-07-23 ENCOUNTER — Encounter: Payer: Self-pay | Admitting: Adult Health

## 2020-07-23 VITALS — BP 150/78 | HR 74 | Ht 65.0 in | Wt 192.0 lb

## 2020-07-23 DIAGNOSIS — G4733 Obstructive sleep apnea (adult) (pediatric): Secondary | ICD-10-CM

## 2020-07-23 DIAGNOSIS — Z9989 Dependence on other enabling machines and devices: Secondary | ICD-10-CM | POA: Diagnosis not present

## 2020-07-23 NOTE — Progress Notes (Signed)
PATIENT: Lynn Estes DOB: [birth year]  REASON FOR VISIT: follow up HISTORY FROM: patient Primary neurologist: Dr. Brett Fairy  HISTORY OF PRESENT ILLNESS: Today 07/23/20:  [name] is a 75 year old female with a history of obstructive sleep apnea on CPAP.  She returns today for follow-up.  She states that she has not used the CPAP since October.  She states that she was struggling with the mask leaking and it was waking her husband up.  She states that she had a full facemask.  She does want to get restarted on the CPAP.  Reports daytime sleepiness.  She returns today for an evaluation.  07/24/19: [name] is a 75 year old female with a history of obstructive sleep apnea on CPAP.  Her download indicates that she use her machine nightly for compliance of 100%.  She used her machine greater than 4 hours each night.  On average she uses her machine 7 hours and 47 minutes.  Her residual AHI is 0.4 on 5 to 15 cm of water with EPR 3.  Leak in the 95th percentile is 27 L/min.  She states that she does have some fatigue but feels this is related to the pandemic.  She states that sometimes she will wake up at night but she is typically able to go back to sleep in approximately 1 hour.  HISTORY 05/21/18 Lynn Estes is a 75 y.o. female for follow up of OSA on CPAP. She has used CPAP every night but does mention that she has moisture buildup in mask that runs down her cheek. She is using full face mask. She has noted benefit with increased energy but is concerned about leak.    Download report as follows:   04/21/2018 - 05/20/2018 20 - 05/20/2018 Usage days 30/30 days (100%) >= 4 hours 29 days (97%) < 4 hours 1 days (3%) Usage hours 188 hours 8 minutes Average usage (total days) 6 hours 16 minutes Average usage (days used) 6 hours 16 minutes Median usage (days used) 6 hours 13 minutes Total used hours (value since last reset - 05/20/2018) 314 hours AirSense 10 AutoSet Serial  number [id number] Mode AutoSet Min Pressure 5 cmH2O Max Pressure 15 cmH2O EPR Fulltime EPR level 3 Response Soft Therapy Pressure - cmH2O Median: 8.1 95th percentile: 10.6 Maximum: 11.6 Leaks - L/min Median: 7.0 95th percentile: 30.9 Maximum: 46.8 Events per hour AI: 0.0 HI: 0.2 AHI: 0.2 Apnea Index Central: 0.0 Obstructive: 0.0 Unknown: 0.0 RERA Index 0.3 Cheyne-Stokes respiration (average duration per night) 0 minutes (0%)  REVIEW OF SYSTEMS: Out of a complete 14 system review of symptoms, the patient complains only of the following symptoms, and all other reviewed systems are negative.  FSS 48 ESS 4  ALLERGIES: No Known Allergies  HOME MEDICATIONS: Outpatient Medications Prior to Visit  Medication Sig Dispense Refill   Calcium 500 MG tablet Take 600 mg by mouth 2 (two) times daily.     Cholecalciferol (VITAMIN D PO) Take 2,000 Units by mouth daily.     Cyanocobalamin (VITAMIN B 12 PO) Take by mouth.     diclofenac (VOLTAREN) 75 MG EC tablet TAKE ONE TABLET BY MOUTH TWICE DAILY  (Patient taking differently: daily as needed.) 30 tablet 0   Fluocinolone Acetonide Scalp 0.01 % OIL Apply topically.     hydrochlorothiazide (HYDRODIURIL) 25 MG tablet Take 1 tablet (25 mg total) by mouth daily. 90 tablet 0   Melatonin 5 MG TABS Take by mouth at bedtime as needed.  metoprolol tartrate (LOPRESSOR) 25 MG tablet TAKE ONE TABLET BY MOUTH ONE TIME DAILY 90 tablet 3   Omega-3 Fatty Acids (FISH OIL) 1000 MG CAPS Fish Oil     TURMERIC PO Take by mouth.     Zinc 50 MG TABS Take by mouth.     predniSONE (DELTASONE) 20 MG tablet 3 tabs poqday 1-2, 2 tabs poqday 3-4, 1 tab poqday 5-6 (Patient not taking: No sig reported) 12 tablet 0   No facility-administered medications prior to visit.    PAST MEDICAL HISTORY: Past Medical History:  Diagnosis Date   Arthritis    generalized-bilateral hands   Asthma    hx of   Cataract    bilateral-LEFT eye   COPD (chronic obstructive pulmonary  disease) ([place])    not being tx'd at this time (03/23/2020)   Diverticulitis    History of shingles    Hypertension    on meds   Lung nodules    Nodules noted on CT scan July 2017   OSA (obstructive sleep apnea)    Partial small bowel obstruction ([place])     PAST SURGICAL HISTORY: Past Surgical History:  Procedure Laterality Date   ANKLE SURGERY  2019   CATARACT EXTRACTION W/PHACO Left 06/08/2015   Procedure: CATARACT EXTRACTION PHACO AND INTRAOCULAR LENS PLACEMENT LEFT EYE CDE=7.18;  Surgeon: Williams Che, MD;  Location: [facility];  Service: Ophthalmology;  Laterality: Left;   DILATION AND CURETTAGE OF UTERUS     X 2   EXERCISE TOLERANCE TEST (GXT)  11/2016   Low Exercise Tolerance - 6:24 min (dyspnea, fatigue), upsloping ST segment depression noted.  Nondiagnostic.  HR 150 bpm = 97% MPHR). Hypertensive response to exercise. NEGATIVE test.  No arrhythmias.   right rotator cuff surgery     TRANSTHORACIC ECHOCARDIOGRAM  11/2016   Normal LV size and function.  GR 1 DD.  Moderate concentric hypertrophy.  Vigorous LV function with EF of 65-70%.   WISDOM TOOTH EXTRACTION      FAMILY HISTORY: Family History  Problem Relation Age of Onset   Hypertension Brother    Colon polyps Neg Hx    Colon cancer Neg Hx    Esophageal cancer Neg Hx    Rectal cancer Neg Hx    Stomach cancer Neg Hx     SOCIAL HISTORY: Social History   Socioeconomic History   Marital status: Married    Spouse name: Not on file   Number of children: 2   Years of education: 13   Highest education level: Not on file  Occupational History   Occupation: Medical illustrator    Comment: Chief Executive Officer  Tobacco Use   Smoking status: Never   Smokeless tobacco: Never  Vaping Use   Vaping Use: Never used  Substance and Sexual Activity   Alcohol use: Yes    Alcohol/week: 14.0 standard drinks    Types: 14 Standard drinks or equivalent per week   Drug use: No   Sexual activity: Not on file  Other Topics  Concern   Not on file  Social History Narrative   PFT---08-20-2009   fev1-2.04, 92%, fev1/fvc 0.79   Widowed, remarried.  2 kids   Works part time Naval architect    Social Determinants of Radio broadcast assistant Strain: Not on [employer] Insecurity: Not on file  Transportation Needs: Not on file  Physical Activity: Not on file  Stress: Not on file  Social Connections: Not on file  Intimate Partner  Violence: Not on file      PHYSICAL EXAM  Vitals:   07/23/20 1306  BP: (!) 150/78  Pulse: 74  Weight: 192 lb (87.1 kg)  Height: 5\' 5"  (1.651 m)   Body mass index is 31.95 kg/m.  Generalized: Well developed, in no acute distress  Chest: Lungs clear to auscultation bilaterally  Neurological examination  Mentation: Alert oriented to time, place, history taking. Follows all commands speech and language fluent Cranial nerve II-XII: Extraocular movements were full, visual field were full on confrontational test Head turning and shoulder shrug  were normal and symmetric. Motor: The motor testing reveals 5 over 5 strength of all 4 extremities. Good symmetric motor tone is noted throughout.  Sensory: Sensory testing is intact to soft touch on all 4 extremities. No evidence of extinction is noted.  Gait and station: Gait is normal.    DIAGNOSTIC DATA (LABS, IMAGING, TESTING) - I reviewed patient records, labs, notes, testing and imaging myself where available.  Lab Results  Component Value Date   WBC 9.4 03/30/2020   HGB 13.8 03/30/2020   HCT 41.3 03/30/2020   MCV 90.2 03/30/2020   PLT 282 03/30/2020      Component Value Date/Time   NA 136 03/30/2020 0908   K 4.4 03/30/2020 0908   CL 100 03/30/2020 0908   CO2 26 03/30/2020 0908   GLUCOSE 100 (H) 03/30/2020 0908   BUN 18 03/30/2020 0908   CREATININE 0.59 (L) 03/30/2020 0908   CALCIUM 9.4 03/30/2020 0908   PROT 7.1 03/30/2020 0908   ALBUMIN 4.0 07/30/2014 2013   AST 16 03/30/2020 0908   ALT 26  03/30/2020 0908   ALKPHOS 67 07/30/2014 2013   BILITOT 0.6 03/30/2020 0908   GFRNONAA 91 03/30/2020 0908   GFRAA 105 03/30/2020 0908   Lab Results  Component Value Date   CHOL 184 03/30/2020   HDL 66 03/30/2020   LDLCALC 91 03/30/2020   TRIG 168 (H) 03/30/2020   CHOLHDL 2.8 03/30/2020       ASSESSMENT AND PLAN 75 y.o. year old female  has a past medical history of Arthritis, Asthma, Cataract, COPD (chronic obstructive pulmonary disease) ([place]), Diverticulitis, History of shingles, Hypertension, Lung nodules, OSA (obstructive sleep apnea), and Partial small bowel obstruction ([place]). here with:  OSA on CPAP  -Patient will be restarted on CPAP order sent to her DME company.  She still has her old machine but needs it evaluated -Mask refitting ordered -Discussed inspire with the patient as she had questions-we will not proceed with this option for now -Encourage patient to restart CPAP, use nightly and greater than 4 hours each night -Follow-up in 6 months or sooner if needed    Ward Givens, MSN, NP-C 07/23/2020, 1:27 PM [facility] [address], [place], [place] [address] [phone]

## 2020-07-23 NOTE — Patient Instructions (Signed)
Your Plan:  Restart CPAP Mask refitting ordered If your symptoms worsen or you develop new symptoms please let us know.   Thank you for coming to see [place] at [facility]. I hope we have been able to provide you high quality care today.  You may receive a patient satisfaction survey over the next few weeks. We would appreciate your feedback and comments so that we may continue to improve ourselves and the health of our patients.

## 2020-07-29 ENCOUNTER — Telehealth: Payer: Self-pay | Admitting: *Deleted

## 2020-07-29 DIAGNOSIS — G514 Facial myokymia: Secondary | ICD-10-CM

## 2020-07-29 NOTE — Telephone Encounter (Signed)
Received call from patient.   Reports that she is having increased eyelid jumping and people are noticing it more lately.   Requested advice on possible treatment.   Please advise.

## 2020-07-30 NOTE — Telephone Encounter (Signed)
Call placed to patient. LMTRC.  

## 2020-07-31 NOTE — Telephone Encounter (Signed)
Call placed to patient and patient made aware.   Agreeable to plan.  

## 2020-07-31 NOTE — Telephone Encounter (Signed)
Call placed to patient. LMTRC.  

## 2020-08-14 ENCOUNTER — Other Ambulatory Visit: Payer: Self-pay | Admitting: Family Medicine

## 2020-08-19 NOTE — Addendum Note (Signed)
Addended by: Sheral Flow on: 08/19/2020 04:31 PM   Modules accepted: Orders

## 2020-08-19 NOTE — Telephone Encounter (Signed)
Received call from patient.   Reports that Quinine tonic water has not resolved issues.   Referral placed for neurology.

## 2020-08-22 ENCOUNTER — Encounter (HOSPITAL_COMMUNITY): Payer: Self-pay | Admitting: Emergency Medicine

## 2020-08-22 ENCOUNTER — Emergency Department (HOSPITAL_COMMUNITY)
Admission: EM | Admit: 2020-08-22 | Discharge: 2020-08-22 | Disposition: A | Discharge status: Home / Self Care | Payer: Medicare Other | Attending: Emergency Medicine | Admitting: Emergency Medicine

## 2020-08-22 ENCOUNTER — Other Ambulatory Visit: Payer: Self-pay

## 2020-08-22 ENCOUNTER — Emergency Department (HOSPITAL_COMMUNITY): Payer: Medicare Other

## 2020-08-22 DIAGNOSIS — J45909 Unspecified asthma, uncomplicated: Secondary | ICD-10-CM | POA: Diagnosis not present

## 2020-08-22 DIAGNOSIS — Z79899 Other long term (current) drug therapy: Secondary | ICD-10-CM | POA: Insufficient documentation

## 2020-08-22 DIAGNOSIS — I10 Essential (primary) hypertension: Secondary | ICD-10-CM | POA: Insufficient documentation

## 2020-08-22 DIAGNOSIS — R0789 Other chest pain: Secondary | ICD-10-CM | POA: Insufficient documentation

## 2020-08-22 DIAGNOSIS — J449 Chronic obstructive pulmonary disease, unspecified: Secondary | ICD-10-CM | POA: Diagnosis not present

## 2020-08-22 DIAGNOSIS — R079 Chest pain, unspecified: Secondary | ICD-10-CM | POA: Diagnosis present

## 2020-08-22 LAB — BASIC METABOLIC PANEL
Anion gap: 9 (ref 5–15)
BUN: 13 mg/dL (ref 8–23)
CO2: 23 mmol/L (ref 22–32)
Calcium: 8.9 mg/dL (ref 8.9–10.3)
Chloride: 98 mmol/L (ref 98–111)
Creatinine, Ser: 0.58 mg/dL (ref 0.44–1.00)
GFR, Estimated: >60 mL/min (ref >60)
Glucose, Bld: 105 mg/dL — ABNORMAL HIGH (ref 70–99)
Potassium: 3.9 mmol/L (ref 3.5–5.1)
Sodium: 130 mmol/L — ABNORMAL LOW (ref 135–145)

## 2020-08-22 LAB — CBC
HCT: 42.1 % (ref 36.0–46.0)
Hemoglobin: 14.1 g/dL (ref 12.0–15.0)
MCH: 30.6 pg (ref 26.0–34.0)
MCHC: 33.5 g/dL (ref 30.0–36.0)
MCV: 91.3 fL (ref 80.0–100.0)
Platelets: 286 10*3/uL (ref 150–400)
RBC: 4.61 MIL/uL (ref 3.87–5.11)
RDW: 13.1 % (ref 11.5–15.5)
WBC: 7.4 10*3/uL (ref 4.0–10.5)
nRBC: 0 % (ref 0.0–0.2)

## 2020-08-22 LAB — TROPONIN I (HIGH SENSITIVITY)
Troponin I (High Sensitivity): 10 ng/L (ref <18)
Troponin I (High Sensitivity): 9 ng/L (ref <18)

## 2020-08-22 MED ORDER — ALUM & MAG HYDROXIDE-SIMETH 200-200-20 MG/5ML PO SUSP
30.0000 mL | Freq: Once | ORAL | Status: AC
  Administered 2020-08-22: 30 mL via ORAL
  Filled 2020-08-22: qty 30

## 2020-08-22 MED ORDER — LIDOCAINE VISCOUS HCL 2 % MT SOLN
15.0000 mL | Freq: Once | OROMUCOSAL | Status: AC
  Administered 2020-08-22: 15 mL via ORAL
  Filled 2020-08-22: qty 15

## 2020-08-22 NOTE — ED Notes (Signed)
Pt states she feels more of a discomfort, states not painful.

## 2020-08-22 NOTE — ED Provider Notes (Signed)
[facility] EMERGENCY DEPARTMENT Provider Note   CSN: [id number] Arrival date & time: 08/22/20  [id number]     History No chief complaint on file.   Lynn Estes is a 75 y.o. female presenting for evaluation of left-sided chest pain.  Patient states she was awakened from sleep with left-sided chest discomfort.  She states it has been constant since.  She states it is a dull ache, but since it was not getting better she came to be evaluated.  She denies associated fever, cough, shortness of breath, diaphoresis, nausea, vomiting, abdominal pain, urinary symptoms, normal bowel movements.  She denies previous history of heart problems.  She has been evaluated once by cardiology many years ago due to tachycardia.  She has never had similar symptoms as today.  She denies recent change in activity.  No change in diet.  No history of GERD or heartburn.  No recent medication changes.  Additional history obtained from chart review.  History of hypertension, palpitations, OSA, COPD.  HPI     Past Medical History:  Diagnosis Date   Arthritis    generalized-bilateral hands   Asthma    hx of   Cataract    bilateral-LEFT eye   COPD (chronic obstructive pulmonary disease) ([place])    not being tx'd at this time (03/23/2020)   Diverticulitis    History of shingles    Hypertension    on meds   Lung nodules    Nodules noted on CT scan July 2017   OSA (obstructive sleep apnea)    Partial small bowel obstruction [facility])     Patient Active Problem List   Diagnosis Date Noted   OSA on CPAP 05/20/2018   Loud snoring 02/15/2018   Hypoxemia during surgery 02/15/2018   Nasal obstruction 02/15/2018   Vasovagal syncope 11/17/2016   Rapid palpitations 11/17/2016   Abnormal EKG 11/17/2016   Chest pain with low risk for cardiac etiology 11/17/2016   Asthma, mild intermittent, well-controlled 02/02/2014   Cough due to ACE inhibitor 02/02/2012   Leg pain, left 11/15/2011   Chronic  insomnia 05/13/2011   Throat tightness 04/15/2011   Seasonal allergic rhinitis 03/02/2010   Essential hypertension 08/18/2009   Lung nodules 08/13/2009    Past Surgical History:  Procedure Laterality Date   ANKLE SURGERY  2019   CATARACT EXTRACTION W/PHACO Left 06/08/2015   Procedure: CATARACT EXTRACTION PHACO AND INTRAOCULAR LENS PLACEMENT LEFT EYE CDE=7.18;  Surgeon: Williams Che, MD;  Location: [facility];  Service: Ophthalmology;  Laterality: Left;   DILATION AND CURETTAGE OF UTERUS     X 2   EXERCISE TOLERANCE TEST (GXT)  11/2016   Low Exercise Tolerance - 6:24 min (dyspnea, fatigue), upsloping ST segment depression noted.  Nondiagnostic.  HR 150 bpm = 97% MPHR). Hypertensive response to exercise. NEGATIVE test.  No arrhythmias.   right rotator cuff surgery     TRANSTHORACIC ECHOCARDIOGRAM  11/2016   Normal LV size and function.  GR 1 DD.  Moderate concentric hypertrophy.  Vigorous LV function with EF of 65-70%.   WISDOM TOOTH EXTRACTION       OB History   No obstetric history on file.     Family History  Problem Relation Age of Onset   Hypertension Brother    Colon polyps Neg Hx    Colon cancer Neg Hx    Esophageal cancer Neg Hx    Rectal cancer Neg Hx    Stomach cancer Neg Hx  Social History   Tobacco Use   Smoking status: Never   Smokeless tobacco: Never  Vaping Use   Vaping Use: Never used  Substance Use Topics   Alcohol use: Yes    Alcohol/week: 14.0 standard drinks    Types: 14 Standard drinks or equivalent per week   Drug use: No    Home Medications Prior to Admission medications   Medication Sig Start Date End Date Taking? Authorizing Provider  Calcium 500 MG tablet Take 600 mg by mouth 2 (two) times daily.    [provider]  Cholecalciferol (VITAMIN D PO) Take 2,000 Units by mouth daily.    [provider]  Cyanocobalamin (VITAMIN B 12 PO) Take by mouth.    [provider]  diclofenac (VOLTAREN) 75 MG EC tablet TAKE  ONE TABLET BY MOUTH TWICE DAILY  Patient taking differently: daily as needed. 03/29/18   Susy Frizzle, MD  Fluocinolone Acetonide Scalp 0.01 % OIL Apply topically.    [provider]  hydrochlorothiazide (HYDRODIURIL) 25 MG tablet TAKE ONE TABLET BY MOUTH ONE TIME DAILY 08/14/20   Susy Frizzle, MD  Melatonin 5 MG TABS Take by mouth at bedtime as needed.    [provider]  metoprolol tartrate (LOPRESSOR) 25 MG tablet TAKE ONE TABLET BY MOUTH ONE TIME DAILY 05/28/20   Susy Frizzle, MD  Omega-3 Fatty Acids (FISH OIL) 1000 MG CAPS Fish Oil    [provider]  TURMERIC PO Take by mouth.    [provider]  Zinc 50 MG TABS Take by mouth.    [provider]    Allergies    Patient has no known allergies.  Review of Systems   Review of Systems  Cardiovascular:  Positive for chest pain.  All other systems reviewed and are negative.  Physical Exam Updated Vital Signs BP (!) 149/95   Pulse 69   Temp 98.6 F (37 C) (Oral)   Resp 19   Ht '5\' 5"'$  (1.651 m)   Wt 86.2 kg   SpO2 95%   BMI 31.62 kg/m   Physical Exam Vitals and nursing note reviewed.  Constitutional:      General: She is not in acute distress.    Appearance: Normal appearance.     Comments: Resting in the bed in NAD  HENT:     Head: Normocephalic and atraumatic.  Eyes:     Conjunctiva/sclera: Conjunctivae normal.     Pupils: Pupils are equal, round, and reactive to light.  Cardiovascular:     Rate and Rhythm: Normal rate and regular rhythm.     Pulses: Normal pulses.  Pulmonary:     Effort: Pulmonary effort is normal. No respiratory distress.     Breath sounds: Normal breath sounds. No wheezing.     Comments: Speaking in full sentences.  Clear lung sounds in all fields. No rash on the chest Chest:     Chest wall: No tenderness.  Abdominal:     General: There is no distension.     Palpations: Abdomen is soft. There is no mass.     Tenderness: There is no  abdominal tenderness. There is no guarding or rebound.  Musculoskeletal:        General: Normal range of motion.     Cervical back: Normal range of motion and neck supple.     Right lower leg: No edema.     Left lower leg: No edema.  Skin:    General: Skin is  warm and dry.     Capillary Refill: Capillary refill takes less than 2 seconds.  Neurological:     Mental Status: She is alert and oriented to person, place, and time.  Psychiatric:        Mood and Affect: Mood and affect normal.        Speech: Speech normal.        Behavior: Behavior normal.    ED Results / Procedures / Treatments   Labs (all labs ordered are listed, but only abnormal results are displayed) Labs Reviewed  BASIC METABOLIC PANEL - Abnormal; Notable for the following components:      Result Value   Sodium 130 (*)    Glucose, Bld 105 (*)    All other components within normal limits  CBC  TROPONIN I (HIGH SENSITIVITY)  TROPONIN I (HIGH SENSITIVITY)    EKG EKG Interpretation  Date/Time:  Saturday August 22 2020 07:24:20 EDT Ventricular Rate:  68 PR Interval:  170 QRS Duration: 82 QT Interval:  416 QTC Calculation: 442 R Axis:   -43 Text Interpretation: Normal sinus rhythm Left axis deviation Septal infarct , age undetermined Abnormal ECG No significant change since last tracing Confirmed by Isla Pence [id number]) on 08/22/2020 8:23:35 AM  Radiology DG Chest 2 View  Result Date: 08/22/2020 CLINICAL DATA:  Chest pain, shortness of breath EXAM: CHEST - 2 VIEW COMPARISON:  Chest radiograph dated 01/30/2014 and CT chest dated 08/03/2015. FINDINGS: The heart size and mediastinal contours are within normal limits. Both lungs are clear. Degenerative changes are seen in the spine. IMPRESSION: No active cardiopulmonary disease. Electronically Signed   By: Zerita Boers M.D.   On: 08/22/2020 09:12    Procedures Procedures   Medications Ordered in ED Medications  alum & mag hydroxide-simeth (MAALOX/MYLANTA)  200-200-20 MG/5ML suspension 30 mL (30 mLs Oral Given 08/22/20 0904)    And  lidocaine (XYLOCAINE) 2 % viscous mouth solution 15 mL (15 mLs Oral Given 08/22/20 [id number])    ED Course  I have reviewed the triage vital signs and the nursing notes.  Pertinent labs & imaging results that were available during my care of the patient were reviewed by me and considered in my medical decision making (see chart for details).    MDM Rules/Calculators/A&P                           Patient presenting for evaluation of left-sided chest pain.  On exam, patient appears nontoxic.  History is very atypical, however considering patient's age, will obtain cardiac work-up.  Also consider other causes such as GI causes.  Patient states symptoms are slightly improved when she sits up, however no history of heartburn.  No calf pain or swelling and vitals are reassuring, doubt PE.  EKG obtained from triage nonischemic.  Case discussed with attending, Dr. Gilford Raid evaluated the patient.  Agrees that this is very noncardiac.  If work-up is negative, plan for discharge and close follow-up with PCP.  Labs interpreted by me, overall reassuring.  Initial troponin negative.  Electrolytes are stable.  Hemoglobin stable.  Chest x-ray viewed and independently interpreted by me, no pneumonia pneumothorax or effusion.  EKG nonischemic.  As pain began today, will obtain delta troponin.  Repeat troponin negative.  On reassessment after GI cocktail, patient reports complete resolution of symptoms.  Discussed findings with patient.  Discussed that at this time, there is not appear to be an acute or life-threatening condition requiring  hospitalization.  Case discussed with attending, Dr. Gilford Raid evaluate the patient.  At this time, patient appear safe for discharge.  Return precautions given.  Patient states she understands and agrees to plan.  Final Clinical Impression(s) / ED Diagnoses Final diagnoses:  Atypical chest pain    Rx / DC  Orders ED Discharge Orders     None        Franchot Heidelberg, PA-C 08/22/20 1200    Isla Pence, MD 08/22/20 1211

## 2020-08-22 NOTE — ED Notes (Signed)
Pt verbalizes understanding of discharge instructions. Opportunity for questions and answers were provided. Pt discharged from the ED.   ?

## 2020-08-22 NOTE — Discharge Instructions (Addendum)
Your work-up today was overall reassuring. I recommend you follow-up with a primary care doctor for recheck of your symptoms. Your symptoms may be due to increasing stomach acid.  Be careful when you are having spicy or acidic foods, this may increase your symptoms. Return to the emergency room with any new, worsening, concerning symptoms

## 2020-08-22 NOTE — ED Triage Notes (Signed)
Reports pain under L breast that woke her up at 5:30am.  Took 1 baby ASA.  Denies sob, nausea, vomiting, or any other associated symptoms.

## 2020-08-22 NOTE — ED Notes (Signed)
Pt states some pain relief after medication. Resting comfortably at this time. VS WNL.

## 2020-08-25 ENCOUNTER — Telehealth: Payer: Self-pay | Admitting: Family Medicine

## 2020-08-25 NOTE — Telephone Encounter (Signed)
Patient left voicemail message to follow up on referral to neurologist to address frequent blinking. Placed outbound call to patient and left message on voicemail for patient to call referral coordinator Vaughan Basta to follow up.

## 2020-09-02 ENCOUNTER — Ambulatory Visit (INDEPENDENT_AMBULATORY_CARE_PROVIDER_SITE_OTHER): Payer: Medicare Other | Admitting: Neurology

## 2020-09-02 ENCOUNTER — Encounter: Payer: Self-pay | Admitting: Neurology

## 2020-09-02 VITALS — BP 166/83 | HR 71 | Ht 65.0 in | Wt 189.5 lb

## 2020-09-02 DIAGNOSIS — G5139 Clonic hemifacial spasm, unspecified: Secondary | ICD-10-CM | POA: Diagnosis not present

## 2020-09-02 MED ORDER — CARBAMAZEPINE ER 100 MG PO TB12: ORAL_TABLET | ORAL | 1 refills | Status: DC

## 2020-09-02 NOTE — Patient Instructions (Signed)
Carbamazepine Chewable Tablets What is this medication? CARBAMAZEPINE (kar ba MAZ e peen) prevents and controls seizures in people with epilepsy. It may also be used to treat nerve pain. It works by calming overactive nerves in your body. This medicine may be used for other purposes; ask your health care provider or pharmacist if you have questions. COMMON BRAND NAME(S): Tegretol What should I tell my care team before I take this medication? They need to know if you have any of these conditions: Asian ancestry Bone marrow disease Glaucoma Heart disease or irregular heartbeat Kidney disease Liver disease Low blood counts, like low white cell, platelet, or red cell counts Porphyria Psychotic disorders Suicidal thoughts, plans, or attempt; a previous suicide attempt by you or a family member An unusual or allergic reaction to carbamazepine, tricyclic antidepressants, phenytoin, phenobarbital or other medications, foods, dyes, or preservatives Pregnant or trying to get pregnant Breast-feeding How should I use this medication? Take this medication by mouth. Chew it or swallow whole. Follow the directions on the prescription label. Take this medication with food. Take your doses at regular intervals. Do not take your medication more often than directed. Do not stop taking this medication except on the advice of your care team. A special MedGuide will be given to you by the pharmacist with each prescription and refill. Be sure to read this information carefully each time. Talk to your care team about the use of this medication in children. While this medication may be prescribed for children 47 years of age and younger for selected conditions, precautions do apply. Overdosage: If you think you have taken too much of this medicine contact a poison control center or emergency room at once. NOTE: This medicine is only for you. Do not share this medicine with others. What if I miss a dose? If you miss  a dose, take it as soon as you can. If it is almost time for your next dose, take only that dose. Do not take double or extra doses. What may interact with this medication? Do not take this medication with any of the following: Certain medications used to treat HIV infection or AIDS that are given in combination with cobicistat Delavirdine MAOIs like Carbex, Eldepryl, Marplan, Nardil, and Parnate Nefazodone Oxcarbazepine This medication may also interact with the following: Acetaminophen Acetazolamide Barbiturate medications for inducing sleep or treating seizures, like phenobarbital Certain antibiotics like clarithromycin, erythromycin or troleandomycin Cimetidine Cyclosporine Danazol Dicumarol Doxycycline Female hormones, including estrogens and birth control pills Grapefruit juice Isoniazid, INH Levothyroxine and other thyroid hormones Lithium and other medications to treat mood problems or psychotic disturbances Loratadine Medications for angina or high blood pressure Medications for cancer Medications for depression or anxiety Medications for sleep Medications to treat fungal infections, like fluconazole, itraconazole or ketoconazole Medications used to treat HIV infection or AIDS Methadone Niacinamide Praziquantel Propoxyphene Rifampin or rifabutin Seizure or epilepsy medication Steroid medications such as prednisone or cortisone Theophylline Tramadol Warfarin This list may not describe all possible interactions. Give your health care provider a list of all the medicines, herbs, non-prescription drugs, or dietary supplements you use. Also tell them if you smoke, drink alcohol, or use illegal drugs. Some items may interact with your medicine. What should I watch for while using this medication? Visit your care team for a regular check on your progress. Do not change brands or dosage forms of this medication without discussing the change with your care team. If you are  taking this medication for epilepsy (seizures),  do not stop taking it suddenly. This increases the risk of seizures. Wear a Probation officer or necklace. Carry an identification card with information about your condition, medications, and care team. You may get drowsy, dizzy, or have blurred vision. Do not drive, use machinery, or do anything that needs mental alertness until you know how this medication affects you. To reduce dizzy or fainting spells, do not sit or stand up quickly, especially if you are an older patient. Alcohol can increase drowsiness and dizziness. Avoid alcoholic drinks. This medication may cause serious skin reactions. They can happen weeks to months after starting the medication. Contact your care team right away if you notice fevers or flu-like symptoms with a rash. The rash may be red or purple and then turn into blisters or peeling of the skin. Or, you might notice a red rash with swelling of the face, lips or lymph nodes in your neck or under your arms. Birth control pills may not work properly while you are taking this medication. Talk to your care team about using an extra method of birth control. This medication can make you more sensitive to the sun. Keep out of the sun. If you cannot avoid being in the sun, wear protective clothing and use sunscreen. Do not use sun lamps or tanning beds/booths. The use of this medication may increase the chance of suicidal thoughts or actions. Pay special attention to how you are responding while on this medication. Any worsening of mood, or thoughts of suicide or dying should be reported to your care team right away. Women who become pregnant while using this medication may enroll in the [place] Pregnancy Registry by calling [phone]. This registry collects information about the safety of antiepileptic medication use during pregnancy. This medication may cause a decrease in vitamin D and folic acid. You  should make sure that you get enough vitamins while you are taking this medication. Discuss the foods you eat and the vitamins you take with your care team. What side effects may I notice from receiving this medication? Side effects that you should report to your care team as soon as possible: Allergic reactions-skin rash, itching, hives, swelling of the face, lips, tongue, or throat Aplastic anemia-unusual weakness or fatigue, dizziness, headache, trouble breathing, increased bleeding or bruising Change in vision Heart rhythm changes-fast or irregular heartbeat, dizziness, feeling faint or lightheaded, chest pain, trouble breathing Infection-fever, chills, cough, or sore throat Liver injury-right upper belly pain, loss of appetite, nausea, light-colored stool, dark yellow or brown urine, yellowing skin or eyes, unusual weakness or fatigue Low sodium level-muscle weakness, fatigue, dizziness, headache, confusion Rash, fever, and swollen lymph nodes Redness, blistering, peeling or loosening of the skin, including inside the mouth Thoughts of suicide or self-harm, worsening mood, feelings of depression Side effects that usually do not require medical attention (report to your care team if they continue or are bothersome): Dizziness-Drowsiness-Loss of balance or coordination-Nausea-Vomiting This list may not describe all possible side effects. Call your doctor for medical advice about side effects. You may report side effects to FDA at 1-800-FDA-1088. Where should I keep my medication? Keep out of reach of children. Store at room temperature below 30 degrees C (86 degrees F). Keep container tightly closed. Protect from moisture. Throw away any unused medication after the expiration date. NOTE: This sheet is a summary. It may not cover all possible information. If you have questions about this medicine, talk to your doctor, pharmacist, or health care  provider.  2022 Elsevier/Gold Standard  (2020-02-11 14:23:35)

## 2020-09-02 NOTE — Progress Notes (Signed)
PATIENT: Lynn Estes DOB: December 21, 1945  REASON FOR VISIT: eye movements, diplopia.   HISTORY FROM: patient Primary neurologist: Dr. Brett Fairy  NEW PRESENT ILLNESS:  Internal referral for eyelid myokymia. Last 7-8 months, blinking has been more constant. pt states she cant keep her eyes open, comes in different spells. R eye mainly. Causing problems w her driving.  RV  09/02/20: Lynn Estes is a 75 year old female who has ben seen here for obstructive sleep apnea on CPAP. The patient presents for a new medical problem, not sleep related. Lynn Estes is a 75 year old female  has a past medical history of Arthritis, Asthma, Cataract, COPD (chronic obstructive pulmonary disease) ([place]), Diverticulitis, History of shingles, Hypertension, Lung nodules, OSA (obstructive sleep apnea), and Partial small bowel obstruction ([place]). here with: uncontrollable eye movements, eye blinking.she denies diplopia. It bothers her to have to squint her eyes all the time without her control. Does not have dry eyes and is not wearing contact.  TIC ? Has seen Wyatt Portela, MD . Lynn Estes - Bobby Estes is a 75 year old female who has ben seen here for obstructive sleep apnea on CPAP.   She states that she has not used the CPAP since October.  She states that she was struggling with the mask leaking and it was waking her husband up.  She states that she had a full facemask. She does want to get restarted on the CPAP but  reports daytime sleepiness.  Air has not leaked into her eyes, she quit using it 4 month ago.   07/24/19: Lynn Estes is a 75 year old female with a history of obstructive sleep apnea on CPAP.  Her download indicates that she use her machine nightly for compliance of 100%.  She used her machine greater than 4 hours each night.  On average she uses her machine 7 hours and 47 minutes.  Her residual AHI is 0.4 on 5 to 15 cm of water with EPR 3.  Leak in the 95th percentile is 27 L/min.   She states that she does have some fatigue but feels this is related to the pandemic.  She states that sometimes she will wake up at night but she is typically able to go back to sleep in approximately 1 hour.  HISTORY 05/21/18 Lynn Estes is a 75 y.o. female for follow up of OSA on CPAP. She has used CPAP every night but does mention that she has moisture buildup in mask that runs down her cheek. She is using full face mask. She has noted benefit with increased energy but is concerned about leak.    Download report as follows:    REVIEW OF SYSTEMS: Out of a complete 14 system review of symptoms, the patient complains only of the following symptoms, and all other reviewed systems are negative.  Facial tic, compulsion to close her eyes, blinking, incessantly.  How likely are you to doze in the following situations: 0 = not likely, 1 = slight chance, 2 = moderate chance, 3 = high chance  Sitting and Reading? Watching Television? Sitting inactive in a public place (theater or meeting)? Lying down in the afternoon when circumstances permit? Sitting and talking to someone? Sitting quietly after lunch without alcohol? In a car, while stopped for a few minutes in traffic? As a passenger in a car for an hour without a break?  Total =  12/ 24  FSS at 45 / 63 points.  GDS 3/ 15 points   ALLERGIES:  No Known Allergies  HOME MEDICATIONS: Outpatient Medications Prior to Visit  Medication Sig Dispense Refill   Calcium 500 MG tablet Take 600 mg by mouth 2 (two) times daily.     Cholecalciferol (VITAMIN D PO) Take 2,000 Units by mouth daily.     Cyanocobalamin (VITAMIN B 12 PO) Take by mouth.     diclofenac (VOLTAREN) 75 MG EC tablet TAKE ONE TABLET BY MOUTH TWICE DAILY  (Patient taking differently: daily as needed.) 30 tablet 0   Fluocinolone Acetonide Scalp 0.01 % OIL Apply topically.     hydrochlorothiazide (HYDRODIURIL) 25 MG tablet TAKE ONE TABLET BY MOUTH ONE TIME DAILY 90 tablet  0   Melatonin 5 MG TABS Take by mouth at bedtime as needed.     metoprolol tartrate (LOPRESSOR) 25 MG tablet TAKE ONE TABLET BY MOUTH ONE TIME DAILY 90 tablet 3   Omega-3 Fatty Acids (FISH OIL) 1000 MG CAPS Fish Oil     TURMERIC PO Take by mouth.     Zinc 50 MG TABS Take by mouth.     No facility-administered medications prior to visit.    PAST MEDICAL HISTORY: Past Medical History:  Diagnosis Date   Arthritis    generalized-bilateral hands   Asthma    hx of   Cataract    bilateral-LEFT eye   COPD (chronic obstructive pulmonary disease) ([place])    not being tx'd at this time (03/23/2020)   Diverticulitis    History of shingles    Hypertension    on meds   Lung nodules    Nodules noted on CT scan July 2017   OSA (obstructive sleep apnea)    Partial small bowel obstruction ([place])     PAST SURGICAL HISTORY: Past Surgical History:  Procedure Laterality Date   ANKLE SURGERY  2019   CATARACT EXTRACTION W/PHACO Left 06/08/2015   Procedure: CATARACT EXTRACTION PHACO AND INTRAOCULAR LENS PLACEMENT LEFT EYE CDE=7.18;  Surgeon: Williams Che, MD;  Location: [facility];  Service: Ophthalmology;  Laterality: Left;   DILATION AND CURETTAGE OF UTERUS     X 2   EXERCISE TOLERANCE TEST (GXT)  11/2016   Low Exercise Tolerance - 6:24 min (dyspnea, fatigue), upsloping ST segment depression noted.  Nondiagnostic.  HR 150 bpm = 97% MPHR). Hypertensive response to exercise. NEGATIVE test.  No arrhythmias.   right rotator cuff surgery     TRANSTHORACIC ECHOCARDIOGRAM  11/2016   Normal LV size and function.  GR 1 DD.  Moderate concentric hypertrophy.  Vigorous LV function with EF of 65-70%.   WISDOM TOOTH EXTRACTION      FAMILY HISTORY: Family History  Problem Relation Age of Onset   Hypertension Brother    Stroke Maternal Grandmother    Colon polyps Neg Hx    Colon cancer Neg Hx    Esophageal cancer Neg Hx    Rectal cancer Neg Hx    Stomach cancer Neg Hx     SOCIAL HISTORY: Social  History   Socioeconomic History   Marital status: Married    Spouse name: Nicole Kindred   Number of children: 2   Years of education: 13   Highest education level: Not on file  Occupational History   Occupation: Medical illustrator    Comment: Chief Executive Officer  Tobacco Use   Smoking status: Never   Smokeless tobacco: Never  Vaping Use   Vaping Use: Never used  Substance and Sexual Activity   Alcohol use: Yes    Alcohol/week: 14.0 standard drinks  Types: 14 Standard drinks or equivalent per week   Drug use: No   Sexual activity: Not on file  Other Topics Concern   Not on file  Social History Narrative   PFT---08-20-2009   fev1-2.04, 92%, fev1/fvc 0.79   Widowed, remarried.  2 kids   Works part time [employer] architect       Lives with husband   Right handed   Caffeine: 2 cups of coffee in the AM   Social Determinants of Radio broadcast assistant Strain: Not on file  Food Insecurity: Not on file  Transportation Needs: Not on file  Physical Activity: Not on file  Stress: Not on file  Social Connections: Not on file  Intimate Partner Violence: Not on file      PHYSICAL EXAM  Vitals:   09/02/20 1404  BP: (!) 166/83  Pulse: 71  Weight: 189 lb 8 oz (86 kg)  Height: '5\' 5"'$  (1.651 m)   Body mass index is 31.53 kg/m.  Generalized: Well developed, in no acute distress  Chest: Lungs clear to auscultation bilaterally  Neurological examination  Mentation: Alert oriented to time, place, history taking. Follows all commands , normal unpressured speech and language fluent Cranial nerve : no loss of smell or taste .  Equal pupils, no proosis,  Extraocular movements were full, visual field were full on confrontational test . Head turning and shoulder shrug  were normal and symmetric. Motor:  symmetric motor tone is noted throughout., equal bulk and strength.  Sensory: decreased in toes, vibration is absent - but is present at the ankle.  Gait and station:  Gait is normal on even surface- has fallen on uneven surfaces.    DIAGNOSTIC DATA (LABS, IMAGING, TESTING) - I reviewed patient records, labs, notes, testing and imaging myself where available.  Lab Results  Component Value Date   WBC 7.4 08/22/2020   HGB 14.1 08/22/2020   HCT 42.1 08/22/2020   MCV 91.3 08/22/2020   PLT 286 08/22/2020      Component Value Date/Time   NA 130 (L) 08/22/2020 0738   K 3.9 08/22/2020 0738   CL 98 08/22/2020 0738   CO2 23 08/22/2020 0738   GLUCOSE 105 (H) 08/22/2020 0738   BUN 13 08/22/2020 0738   CREATININE 0.58 08/22/2020 0738   CREATININE 0.59 (L) 03/30/2020 0908   CALCIUM 8.9 08/22/2020 0738   PROT 7.1 03/30/2020 0908   ALBUMIN 4.0 07/30/2014 2013   AST 16 03/30/2020 0908   ALT 26 03/30/2020 0908   ALKPHOS 67 07/30/2014 2013   BILITOT 0.6 03/30/2020 0908   GFRNONAA >60 08/22/2020 0738   GFRNONAA 91 03/30/2020 0908   GFRAA 105 03/30/2020 0908   Lab Results  Component Value Date   CHOL 184 03/30/2020   HDL 66 03/30/2020   LDLCALC 91 03/30/2020   TRIG 168 (H) 03/30/2020   CHOLHDL 2.8 03/30/2020       ASSESSMENT AND PLAN  Patient with a form of facial tic, perhaps myochymia.   Will try low dose tegretol as a treatment.  Will consider facial botox, evaluation with dr Krista Blue.   Larey Seat, MD  09/02/2020, 2:23 PM [facility] [address] [place], [place] [address] [phone]

## 2020-09-21 ENCOUNTER — Ambulatory Visit
Admission: RE | Admit: 2020-09-21 | Discharge: 2020-09-21 | Disposition: A | Discharge status: Home / Self Care | Payer: Medicare Other | Source: Ambulatory Visit | Attending: Obstetrics and Gynecology | Admitting: Obstetrics and Gynecology

## 2020-09-21 ENCOUNTER — Other Ambulatory Visit: Payer: Self-pay

## 2020-09-21 ENCOUNTER — Other Ambulatory Visit: Payer: Self-pay | Admitting: Obstetrics and Gynecology

## 2020-09-21 DIAGNOSIS — N6001 Solitary cyst of right breast: Secondary | ICD-10-CM

## 2020-10-06 ENCOUNTER — Telehealth: Payer: Self-pay | Admitting: Neurology

## 2020-10-06 NOTE — Telephone Encounter (Signed)
LVM returning pt call. 

## 2020-10-06 NOTE — Telephone Encounter (Signed)
Pt calledcarbamazepine (TEGRETOL-XR) 100 MG 12 hr tablet, is not working. Both eyes are still jerky, has not got any better. Would like a call from the nurse

## 2020-10-07 NOTE — Telephone Encounter (Signed)
Called patient back.  Patient states that she has been taking initially 1 tablet in the morning 1 tablet at night.  The morning tablets started to make her nauseous so she cut back to just 1 tablet at night.  She says that she is not feeling any relief with the eye twitching.  She states this continues to be a problem especially with her vision.  Recommended that the patient take 2 tablets tonight at that time to see if she feels any relief with that dosage.  Advised I will call her first thing in the morning to see how she tolerated the medication.  Advised I would have to talk to Dr. Brett Fairy in the morning and then I will contact her with her recommendations after we discuss how she does.  Patient verbalized understanding.

## 2020-10-07 NOTE — Telephone Encounter (Signed)
pt returned the call Please call.

## 2020-10-08 ENCOUNTER — Other Ambulatory Visit: Payer: Self-pay | Admitting: Neurology

## 2020-10-08 DIAGNOSIS — G5139 Clonic hemifacial spasm, unspecified: Secondary | ICD-10-CM

## 2020-10-08 NOTE — Telephone Encounter (Signed)
Called the patient back.  Questioned how she did overnight and this morning after taking 2 tablets at bedtime as she states that she tolerated the 2 tablets well without being nauseous and that so far this morning she seems to be doing okay.  She says that typically though the medication does not last and the twitching can start back up intermittently throughout the day so much so that it affects her vision.  She wants to know if there is anything that we will make this any better if so what will the next steps be.  She questioned if she needed to see an optometrist.  Advised that I would bring this to Dr. Edwena Felty attention and see if she has any other recommendations or thoughts on the symptoms and what treatment options are available.she verbalized understanding and will wait for me to call back.

## 2020-10-08 NOTE — Telephone Encounter (Signed)
Called the patient back.  There was no answer. Left a detailed message advising I had spoken with Dr. Brett Fairy.  Dr. Brett Fairy recommends the patient continue taking the 2 tablets at bedtime since she tolerated that without being nauseous.  In the meantime we will go ahead and place a referral to one of our specialist here that does Botox to see if that will help with the facial spasms.  Advised on the voicemail the referral will be placed and if she has any questions she can give [place] a call back.

## 2020-10-15 ENCOUNTER — Telehealth: Payer: Self-pay | Admitting: *Deleted

## 2020-10-15 NOTE — Telephone Encounter (Signed)
Received call from patient.   Reports that she received COVID Booster earlier in the week. Reports that now injection site is red, tender and warm to touch. States that she was funning low grade fever last night as well. Reports extreme nausea and fatigue as well.   Please advise.

## 2020-10-16 NOTE — Telephone Encounter (Signed)
Call placed to patient.   States that injection site irritation has resolved and she is now feeling much improved.   States that she does not think it needs to be seen now.

## 2020-10-22 ENCOUNTER — Telehealth: Payer: Self-pay | Admitting: Neurology

## 2020-10-22 NOTE — Telephone Encounter (Signed)
Hey, can one of you call to get an appt set up? Thank you Called pt back. She received message from CB,RN on 10/08/20. Aware referral placed, she is still waiting to get scheduled. Aware I will send message to referral team to call to get her set up with botox specialist. She verbalized understanding.

## 2020-10-22 NOTE — Telephone Encounter (Signed)
Pt still having jerking in her eye, pt wants to know if Botox could help, please call.

## 2020-10-27 NOTE — Telephone Encounter (Signed)
Ok thank you, she needs to get scheduled with Dr. Krista Blue or Dr. Jaynee Eagles

## 2020-11-09 ENCOUNTER — Telehealth: Payer: Self-pay | Admitting: *Deleted

## 2020-11-09 NOTE — Telephone Encounter (Signed)
Received call from patient.   Reports that she had episode on 11/05/2020 of dizziness and passing out. States that EMS was called and BP noted elevated.   Patient refused transport to ER.   States that she has had episodes like this in the past.   Appointment scheduled.

## 2020-11-12 ENCOUNTER — Encounter: Payer: Self-pay | Admitting: Family Medicine

## 2020-11-12 ENCOUNTER — Ambulatory Visit (INDEPENDENT_AMBULATORY_CARE_PROVIDER_SITE_OTHER): Payer: Medicare Other | Admitting: Family Medicine

## 2020-11-12 ENCOUNTER — Other Ambulatory Visit: Payer: Self-pay

## 2020-11-12 VITALS — BP 138/82 | HR 78 | Temp 97.5°F | Resp 18 | Ht 65.0 in | Wt 193.0 lb

## 2020-11-12 DIAGNOSIS — R55 Syncope and collapse: Secondary | ICD-10-CM | POA: Diagnosis not present

## 2020-11-12 NOTE — Progress Notes (Signed)
Subjective:    Patient ID: Lynn Estes, female    DOB: 07-23-1945, 75 y.o.   MRN: [id number]  HPI Patient is a very pleasant 75 year old Caucasian female who presents today having experienced syncope.  In 2018 she had a stress test that was normal.  An echocardiogram was normal.  Recently she was eating dinner with her husband.  She developed intense sharp crampy abdominal pain.  She felt like she was going to have a bowel movement and become fecally incontinent.  She remembers starting to feel nauseated and lightheaded.  That was the last thing she remembered.  She then lost consciousness.  She states that she regained consciousness just a second or 2 later.  There was no witnessed seizure activity.  There was no bowel or bladder incontinence.  She did not bite her tongue.  She denied any palpitations or chest pain or shortness of breath.  This occurred 1 week ago.  She is had no symptoms since.  Lab work in August was normal. Past Medical History:  Diagnosis Date   Arthritis    generalized-bilateral hands   Asthma    hx of   Cataract    bilateral-LEFT eye   COPD (chronic obstructive pulmonary disease) ([place])    not being tx'd at this time (03/23/2020)   Diverticulitis    History of shingles    Hypertension    on meds   Lung nodules    Nodules noted on CT scan July 2017   OSA (obstructive sleep apnea)    Partial small bowel obstruction [facility])    Past Surgical History:  Procedure Laterality Date   ANKLE SURGERY  2019   CATARACT EXTRACTION W/PHACO Left 06/08/2015   Procedure: CATARACT EXTRACTION PHACO AND INTRAOCULAR LENS PLACEMENT LEFT EYE CDE=7.18;  Surgeon: Williams Che, MD;  Location: [facility];  Service: Ophthalmology;  Laterality: Left;   DILATION AND CURETTAGE OF UTERUS     X 2   EXERCISE TOLERANCE TEST (GXT)  11/2016   Low Exercise Tolerance - 6:24 min (dyspnea, fatigue), upsloping ST segment depression noted.  Nondiagnostic.  HR 150 bpm = 97% MPHR). Hypertensive response  to exercise. NEGATIVE test.  No arrhythmias.   right rotator cuff surgery     TRANSTHORACIC ECHOCARDIOGRAM  11/2016   Normal LV size and function.  GR 1 DD.  Moderate concentric hypertrophy.  Vigorous LV function with EF of 65-70%.   WISDOM TOOTH EXTRACTION     Current Outpatient Medications on File Prior to Visit  Medication Sig Dispense Refill   buPROPion (WELLBUTRIN SR) 150 MG 12 hr tablet Take 150 mg by mouth daily.     Calcium 500 MG tablet Take 600 mg by mouth 2 (two) times daily.     carbamazepine (TEGRETOL-XR) 100 MG 12 hr tablet Take one at night and if needed in daytime, maximum three a day. 90 tablet 1   Cholecalciferol (VITAMIN D PO) Take 2,000 Units by mouth daily.     clobetasol (TEMOVATE) 0.05 % external solution Apply topically.     Cyanocobalamin (VITAMIN B 12 PO) Take by mouth.     diclofenac (VOLTAREN) 75 MG EC tablet TAKE ONE TABLET BY MOUTH TWICE DAILY  (Patient taking differently: daily as needed.) 30 tablet 0   Fluocinolone Acetonide Scalp 0.01 % OIL Apply topically.     fluticasone (CUTIVATE) 0.05 % cream Apply 1 application topically daily.     hydrochlorothiazide (HYDRODIURIL) 25 MG tablet TAKE ONE TABLET BY MOUTH ONE TIME DAILY 90  tablet 0   Melatonin 5 MG TABS Take by mouth at bedtime as needed.     metoprolol tartrate (LOPRESSOR) 25 MG tablet TAKE ONE TABLET BY MOUTH ONE TIME DAILY 90 tablet 3   Omega-3 Fatty Acids (FISH OIL) 1000 MG CAPS Fish Oil     TURMERIC PO Take by mouth.     Zinc 50 MG TABS Take by mouth.     No current facility-administered medications on file prior to visit.   No Known Allergies Social History   Socioeconomic History   Marital status: Married    Spouse name: Nicole Kindred   Number of children: 2   Years of education: 13   Highest education level: Not on file  Occupational History   Occupation: Medical illustrator    Comment: Chief Executive Officer  Tobacco Use   Smoking status: Never   Smokeless tobacco: Never  Vaping Use   Vaping  Use: Never used  Substance and Sexual Activity   Alcohol use: Yes    Alcohol/week: 14.0 standard drinks    Types: 14 Standard drinks or equivalent per week   Drug use: No   Sexual activity: Not on file  Other Topics Concern   Not on file  Social History Narrative   PFT---08-20-2009   fev1-2.04, 92%, fev1/fvc 0.79   Widowed, remarried.  2 kids   Works part time [employer]       Lives with husband   Right handed   Caffeine: 2 cups of coffee in the AM   Social Determinants of Radio broadcast assistant Strain: Not on file  Food Insecurity: Not on file  Transportation Needs: Not on file  Physical Activity: Not on file  Stress: Not on file  Social Connections: Not on file  Intimate Partner Violence: Not on file   Family History  Problem Relation Age of Onset   Hypertension Brother    Stroke Maternal Grandmother    Colon polyps Neg Hx    Colon cancer Neg Hx    Esophageal cancer Neg Hx    Rectal cancer Neg Hx    Stomach cancer Neg Hx       Review of Systems  All other systems reviewed and are negative.     Objective:   Physical Exam Vitals reviewed.  Constitutional:      General: She is not in acute distress.    Appearance: She is well-developed. She is not diaphoretic.  HENT:     Head: Normocephalic and atraumatic.     Right Ear: External ear normal.     Left Ear: External ear normal.     Nose: Nose normal.     Mouth/Throat:     Pharynx: No oropharyngeal exudate.  Eyes:     General: No scleral icterus.       Right eye: No discharge.        Left eye: No discharge.     Conjunctiva/sclera: Conjunctivae normal.     Pupils: Pupils are equal, round, and reactive to light.  Neck:     Thyroid: No thyromegaly.     Vascular: No JVD.     Trachea: No tracheal deviation.  Cardiovascular:     Rate and Rhythm: Normal rate and regular rhythm.     Heart sounds: Normal heart sounds. No murmur heard.   No friction rub. No gallop.  Pulmonary:      Effort: Pulmonary effort is normal. No respiratory distress.     Breath sounds: Normal breath sounds. No stridor.  No wheezing or rales.  Chest:     Chest wall: No tenderness.  Abdominal:     General: Bowel sounds are normal. There is no distension.     Palpations: Abdomen is soft. There is no mass.     Tenderness: There is no abdominal tenderness. There is no guarding or rebound.     Hernia: No hernia is present.  Musculoskeletal:     Cervical back: Normal range of motion.     Lumbar back: No spasms or tenderness. Normal range of motion.     Right lower leg: No edema.     Left lower leg: No edema.  Lymphadenopathy:     Cervical: No cervical adenopathy.  Skin:    General: Skin is warm.     Coloration: Skin is not jaundiced or pale.     Findings: No bruising, erythema, lesion or rash.  Neurological:     General: No focal deficit present.     Mental Status: She is oriented to person, place, and time. Mental status is at baseline.     Cranial Nerves: No cranial nerve deficit.     Sensory: No sensory deficit.     Motor: No weakness.     Coordination: Coordination normal.     Gait: Gait normal.     Deep Tendon Reflexes: Reflexes normal.  Psychiatric:        Mood and Affect: Mood normal.        Behavior: Behavior normal.        Thought Content: Thought content normal.        Judgment: Judgment normal.          Assessment & Plan:  Syncope, unspecified syncope type I suspect the patient had vasovagal syncope is brought on by intestinal spasms.  However I would like the patient to see her cardiologist for cardiac monitor to rule out any cardiac arrhythmias.  If the cardiac monitor is normal, I do not feel that the patient requires further work-up.  Symptoms do not sound consistent with a seizure.

## 2020-11-18 ENCOUNTER — Other Ambulatory Visit: Payer: Self-pay | Admitting: Family Medicine

## 2020-11-18 NOTE — Progress Notes (Signed)
Cardiology Office Note   Date:  11/19/2020   ID:  Lynn Estes, DOB 10/12/1945, MRN [id number]  PCP:  Lynn Frizzle, MD  Cardiologist:   None Referring:  Lynn Frizzle, MD  Chief Complaint  Patient presents with   Loss of Consciousness       History of Present Illness: Lynn Estes is a 75 y.o. female who was referred by Lynn Frizzle, MD for evaluation of syncope.  She has no significant past cardiac history.  She saw Dr. Ellyn Hack in 2018.  She had a NL echo and negative.  POET (Plain Old Exercise Treadmill).   This was for evaluation of chest pain.  She was apparently in the hospital she thinks about 10 years ago for syncope but I did not see this.  She does not usually have passing out spells.  She actually exercises 30 minutes 2 times a week.  She denies any cardiovascular complaints.  The patient denies any new symptoms such as chest discomfort, neck or arm discomfort. There has been no new shortness of breath, PND or orthopnea. There have been no reported palpitations, presyncope or syncope other than the event described below.  The syncopal episode happened when she was sitting in a restaurant.  She had an abdominal sensation like she had to go to the bathroom.  She then felt slight prodrome and the next thing she knows she was picking her head up off her shoulder.  911 was called but she did not want to have transport.  They apparently did not find any acute abnormalities.  This was about 2 weeks ago.  Since that time she has not had any further events.  She denies any orthostatic symptoms.     Past Medical History:  Diagnosis Date   Arthritis    generalized-bilateral hands   Asthma    hx of   Cataract    bilateral-LEFT eye   COPD (chronic obstructive pulmonary disease) ([place])    not being tx'd at this time (03/23/2020)   Diverticulitis    History of shingles    Hypertension    on meds   Lung nodules    Nodules noted on CT scan July 2017   OSA  (obstructive sleep apnea)    Partial small bowel obstruction [facility])     Past Surgical History:  Procedure Laterality Date   ANKLE SURGERY  2019   CATARACT EXTRACTION W/PHACO Left 06/08/2015   Procedure: CATARACT EXTRACTION PHACO AND INTRAOCULAR LENS PLACEMENT LEFT EYE CDE=7.18;  Surgeon: Williams Che, MD;  Location: [facility];  Service: Ophthalmology;  Laterality: Left;   DILATION AND CURETTAGE OF UTERUS     X 2   EXERCISE TOLERANCE TEST (GXT)  11/2016   Low Exercise Tolerance - 6:24 min (dyspnea, fatigue), upsloping ST segment depression noted.  Nondiagnostic.  HR 150 bpm = 97% MPHR). Hypertensive response to exercise. NEGATIVE test.  No arrhythmias.   right rotator cuff surgery     TRANSTHORACIC ECHOCARDIOGRAM  11/2016   Normal LV size and function.  GR 1 DD.  Moderate concentric hypertrophy.  Vigorous LV function with EF of 65-70%.   WISDOM TOOTH EXTRACTION       Current Outpatient Medications  Medication Sig Dispense Refill   Calcium 500 MG tablet Take 600 mg by mouth 2 (two) times daily.     carbamazepine (TEGRETOL-XR) 100 MG 12 hr tablet Take one at night and if needed in daytime, maximum three a day. 90 tablet 1  Cholecalciferol (VITAMIN D PO) Take 2,000 Units by mouth daily.     clobetasol (TEMOVATE) 0.05 % external solution Apply topically.     Cyanocobalamin (VITAMIN B 12 PO) Take by mouth.     diclofenac (VOLTAREN) 75 MG EC tablet TAKE ONE TABLET BY MOUTH TWICE DAILY  (Patient taking differently: daily as needed.) 30 tablet 0   Fluocinolone Acetonide Scalp 0.01 % OIL Apply topically.     fluticasone (CUTIVATE) 0.05 % cream Apply 1 application topically daily.     hydrochlorothiazide (HYDRODIURIL) 25 MG tablet TAKE ONE TABLET BY MOUTH ONE TIME DAILY 90 tablet 0   Melatonin 5 MG TABS Take by mouth at bedtime as needed.     metoprolol tartrate (LOPRESSOR) 25 MG tablet TAKE ONE TABLET BY MOUTH ONE TIME DAILY 90 tablet 3   Omega-3 Fatty Acids (FISH OIL) 1000 MG CAPS Fish Oil      TURMERIC PO Take by mouth.     Zinc 50 MG TABS Take by mouth.     No current facility-administered medications for this visit.    Allergies:   Patient has no known allergies.    Social History:  The patient  reports that she has never smoked. She has never used smokeless tobacco. She reports current alcohol use of about 14.0 standard drinks per week. She reports that she does not use drugs.   Family History:  The patient's family history includes Hypertension in her brother; Stroke in her maternal grandmother.   Both parents died from accidents early ages.    ROS:  Please see the history of present illness.   Otherwise, review of systems are positive for fatigue.   All other systems are reviewed and negative.    PHYSICAL EXAM: VS:  BP 138/82   Pulse 68   Ht 5\' 5"  (1.651 m)   Wt 189 lb 9.6 oz (86 kg)   SpO2 99%   BMI 31.55 kg/m  , BMI Body mass index is 31.55 kg/m. GENERAL:  Well appearing HEENT:  Pupils equal round and reactive, fundi not visualized, oral mucosa unremarkable NECK:  No jugular venous distention, waveform within normal limits, carotid upstroke brisk and symmetric, no bruits, no thyromegaly LYMPHATICS:  No cervical, inguinal adenopathy LUNGS:  Clear to auscultation bilaterally BACK:  No CVA tenderness CHEST:  Unremarkable HEART:  PMI not displaced or sustained,S1 and S2 within normal limits, no S3, no S4, no clicks, no rubs, 2 out of 6 apical systolic murmur and heard at the right upper sternal border, no diastolic murmurs, early peaking, no change with Valsalva  ABD:  Flat, positive bowel sounds normal in frequency in pitch, no bruits, no rebound, no guarding, no midline pulsatile mass, no hepatomegaly, no splenomegaly EXT:  2 plus pulses throughout, no edema, no cyanosis no clubbing SKIN:  No rashes no nodules NEURO:  Cranial nerves II through XII grossly intact, motor grossly intact throughout PSYCH:  Cognitively intact, oriented to person place and  time    EKG:  EKG is ordered today. The ekg ordered today demonstrates sinus rhythm, rate 68, left axis deviation, left anterior fascicular block, voltage criteria for left ventricular hypertrophy, no acute ST-T wave changes   Recent Labs: 03/30/2020: ALT 26 08/22/2020: BUN 13; Creatinine, Ser 0.58; Hemoglobin 14.1; Platelets 286; Potassium 3.9; Sodium 130    Lipid Panel    Component Value Date/Time   CHOL 184 03/30/2020 0908   TRIG 168 (H) 03/30/2020 0908   HDL 66 03/30/2020 0908   CHOLHDL 2.8  03/30/2020 0908   LDLCALC 91 03/30/2020 0908      Wt Readings from Last 3 Encounters:  11/19/20 189 lb 9.6 oz (86 kg)  11/12/20 193 lb (87.5 kg)  09/02/20 189 lb 8 oz (86 kg)      Other studies Reviewed: Additional studies/ records that were reviewed today include: Previous echo and POET (Plain Old Exercise Treadmill). Review of the above records demonstrates:  Please see elsewhere in the note.     ASSESSMENT AND PLAN:  Syncope: I think the syncope was probably vasovagal.  She does have a slightly abnormal EKG.  I think it is reasonable to check a 3-day monitor but I have a low suspicion of an arrhythmia.  She did have a very mild blood pressure drop with orthostatics but no symptoms.  HTN: The blood pressure is well controlled.  No change in therapy.  Murmur: I suspect this is aortic sclerosis and she had an echo just a few years ago that was unremarkable.  No change in therapy.  Fatigue: She says she does not sleep well though she uses CPAP for sleep apnea.  She is not anemic.  I will check a TSH.  Current medicines are reviewed at length with the patient today.  The patient does not have concerns regarding medicines.  The following changes have been made:  no change  Labs/ tests ordered today include:   Orders Placed This Encounter  Procedures   TSH   LONG TERM MONITOR (3-14 DAYS)   EKG 12-Lead      Disposition:   FU with me as needed.     Signed, Minus Breeding, MD  11/19/2020 2:48 PM    [facility]

## 2020-11-19 ENCOUNTER — Other Ambulatory Visit: Payer: Self-pay

## 2020-11-19 ENCOUNTER — Ambulatory Visit (INDEPENDENT_AMBULATORY_CARE_PROVIDER_SITE_OTHER): Payer: Medicare Other

## 2020-11-19 ENCOUNTER — Ambulatory Visit (INDEPENDENT_AMBULATORY_CARE_PROVIDER_SITE_OTHER): Payer: Medicare Other | Admitting: Cardiology

## 2020-11-19 ENCOUNTER — Encounter: Payer: Self-pay | Admitting: Cardiology

## 2020-11-19 VITALS — BP 138/82 | HR 68 | Ht 65.0 in | Wt 189.6 lb

## 2020-11-19 DIAGNOSIS — I10 Essential (primary) hypertension: Secondary | ICD-10-CM

## 2020-11-19 DIAGNOSIS — R55 Syncope and collapse: Secondary | ICD-10-CM | POA: Diagnosis not present

## 2020-11-19 NOTE — Patient Instructions (Signed)
Medication Instructions:  Your physician recommends that you continue on your current medications as directed. Please refer to the Current Medication list given to you today.  *If you need a refill on your cardiac medications before your next appointment, please call your pharmacy*   Lab Work: Your physician recommends that you have labs drawn today: TSH  If you have labs (blood work) drawn today and your tests are completely normal, you will receive your results only by: [place] (if you have MyChart) OR A paper copy in the mail If you have any lab test that is abnormal or we need to change your treatment, we will call you to review the results.   Testing/Procedures: [place] [place]- Long Term Monitor Instructions  Your physician has requested you wear a ZIO patch monitor for 3 days.  This is a single patch monitor. [employer] supplies one patch monitor per enrollment. Additional stickers are not available. Please do not apply patch if you will be having a Nuclear Stress Test,  Echocardiogram, Cardiac CT, MRI, or Chest Xray during the period you would be wearing the  monitor. The patch cannot be worn during these tests. You cannot remove and re-apply the  ZIO XT patch monitor.  Your ZIO patch monitor will be mailed 3 day USPS to your address on file. It may take 3-5 days  to receive your monitor after you have been enrolled.  Once you have received your monitor, please review the enclosed instructions. Your monitor  has already been registered assigning a specific monitor serial # to you.  Billing and Patient Assistance Program Information  We have supplied [employer] with any of your insurance information on file for billing purposes. [employer] offers a sliding scale Patient Assistance Program for patients that do not have  insurance, or whose insurance does not completely cover the cost of the ZIO monitor.  You must apply for the Patient Assistance Program to qualify for this discounted  rate.  To apply, please call [employer] at [phone], select option 4, select option 2, ask to apply for  Patient Assistance Program. Theodore Demark will ask your household income, and how many people  are in your household. They will quote your out-of-pocket cost based on that information.  [employer] will also be able to set up a 32-month, interest-free payment plan if needed.  Applying the monitor   Shave hair from upper left chest.  Hold abrader disc by orange tab. Rub abrader in 40 strokes over the upper left chest as  indicated in your monitor instructions.  Clean area with 4 enclosed alcohol pads. Let dry.  Apply patch as indicated in monitor instructions. Patch will be placed under collarbone on left  side of chest with arrow pointing upward.  Rub patch adhesive wings for 2 minutes. Remove white label marked "1". Remove the white  label marked "2". Rub patch adhesive wings for 2 additional minutes.  While looking in a mirror, press and release button in center of patch. A small green light will  flash 3-4 times. This will be your only indicator that the monitor has been turned on.  Do not shower for the first 24 hours. You may shower after the first 24 hours.  Press the button if you feel a symptom. You will hear a small click. Record Date, Time and  Symptom in the Patient Logbook.  When you are ready to remove the patch, follow instructions on the last 2 pages of Patient  Logbook. Stick patch monitor onto the  last page of Patient Logbook.  Place Patient Logbook in the blue and white box. Use locking tab on box and tape box closed  securely. The blue and white box has prepaid postage on it. Please place it in the mailbox as  soon as possible. Your physician should have your test results approximately 7 days after the  monitor has been mailed back to [facility].  Call [place] at [phone] if you have questions regarding  your ZIO XT patch monitor. Call them  immediately if you see an orange light blinking on your  monitor.  If your monitor falls off in less than 4 days, contact our Monitor department at [phone].  If your monitor becomes loose or falls off after 4 days call [employer] at [phone] for  suggestions on securing your monitor    Follow-Up: At [facility], you and your health needs are our priority.  As part of our continuing mission to provide you with exceptional heart care, we have created designated Provider Care Teams.  These Care Teams include your primary Cardiologist (physician) and Advanced Practice Providers (APPs -  Physician Assistants and Nurse Practitioners) who all work together to provide you with the care you need, when you need it.  We recommend signing up for the patient portal called "MyChart".  Sign up information is provided on this After Visit Summary.  MyChart is used to connect with patients for Virtual Visits (Telemedicine).  Patients are able to view lab/test results, encounter notes, upcoming appointments, etc.  Non-urgent messages can be sent to your provider as well.   To learn more about what you can do with MyChart, go to [email or web address].    Your next appointment:   We will see you on an as needed basis.  Provider:   Minus Breeding, MD

## 2020-11-19 NOTE — Progress Notes (Unsigned)
Patient enrolled for [employer] to mail a 3 day ZIO XT monitor to her address on file.

## 2020-11-20 ENCOUNTER — Other Ambulatory Visit: Payer: Self-pay | Admitting: *Deleted

## 2020-11-20 DIAGNOSIS — I10 Essential (primary) hypertension: Secondary | ICD-10-CM

## 2020-11-20 DIAGNOSIS — R55 Syncope and collapse: Secondary | ICD-10-CM

## 2020-11-20 DIAGNOSIS — R7989 Other specified abnormal findings of blood chemistry: Secondary | ICD-10-CM

## 2020-11-20 LAB — TSH: TSH: 0.329 u[IU]/mL — ABNORMAL LOW (ref 0.450–4.500)

## 2020-11-22 DIAGNOSIS — R55 Syncope and collapse: Secondary | ICD-10-CM | POA: Diagnosis not present

## 2020-11-24 ENCOUNTER — Telehealth: Payer: Self-pay | Admitting: Neurology

## 2020-11-24 LAB — T3, FREE: T3, Free: 3.3 pg/mL (ref 2.0–4.4)

## 2020-11-24 LAB — T4, FREE: Free T4: 1.32 ng/dL (ref 0.82–1.77)

## 2020-11-24 LAB — SPECIMEN STATUS REPORT

## 2020-11-24 NOTE — Telephone Encounter (Signed)
Pt called wanting to request to stop taking her carbamazepine (TEGRETOL-XR) 100 MG 12 hr tablet she states the symptoms have not improved since on this medication. She would like to know if there are any side effects if she were to just stop taking it. Please advise.

## 2020-11-24 NOTE — Telephone Encounter (Signed)
Pt currently taking crabamazepine 100mg , 2 tabs po qhs. She has initial consult for botox eval w/ Dr. Krista Blue 12/29/20. Will speak w/ MD to see if ok to stop or taper off.

## 2020-11-24 NOTE — Telephone Encounter (Signed)
Called the patient back and advised that since it has been less than 6 mths of her taking the medication she can just stop the medication without weaning. Pt verbalized understanding.

## 2020-11-25 ENCOUNTER — Encounter: Payer: Self-pay | Admitting: *Deleted

## 2020-12-04 ENCOUNTER — Telehealth: Payer: Self-pay | Admitting: Cardiology

## 2020-12-04 NOTE — Telephone Encounter (Signed)
Called patient, answered any and all questions in regards to the lab work.  Also mentioned monitor was in, but needed to be resulted by MD and once those were received we would call back.  Patient verbalized understanding.

## 2020-12-04 NOTE — Telephone Encounter (Signed)
Patient called and wanted to get further clarification about her recent lab results. She also wanted to know if the results from her heart monitor came back or not. Please call patient to go over both sets of results

## 2020-12-07 ENCOUNTER — Encounter: Payer: Self-pay | Admitting: *Deleted

## 2020-12-14 ENCOUNTER — Ambulatory Visit: Payer: Medicare Other | Admitting: Cardiology

## 2020-12-29 ENCOUNTER — Institutional Professional Consult (permissible substitution): Payer: Medicare Other | Admitting: Neurology

## 2021-01-16 ENCOUNTER — Other Ambulatory Visit: Payer: Self-pay | Admitting: Family Medicine

## 2021-02-01 ENCOUNTER — Ambulatory Visit: Payer: Medicare Other | Admitting: Adult Health

## 2021-02-25 ENCOUNTER — Ambulatory Visit
Admission: RE | Admit: 2021-02-25 | Discharge: 2021-02-25 | Disposition: A | Discharge status: Home / Self Care | Payer: Medicare Other | Source: Ambulatory Visit | Attending: Obstetrics and Gynecology | Admitting: Obstetrics and Gynecology

## 2021-02-25 DIAGNOSIS — N6001 Solitary cyst of right breast: Secondary | ICD-10-CM

## 2021-04-15 ENCOUNTER — Other Ambulatory Visit: Payer: Self-pay | Admitting: Family Medicine

## 2021-04-27 ENCOUNTER — Ambulatory Visit (INDEPENDENT_AMBULATORY_CARE_PROVIDER_SITE_OTHER): Payer: Medicare Other | Admitting: Family Medicine

## 2021-04-27 VITALS — BP 148/100 | HR 66 | Temp 97.7°F | Ht 65.0 in | Wt 195.8 lb

## 2021-04-27 DIAGNOSIS — E0590 Thyrotoxicosis, unspecified without thyrotoxic crisis or storm: Secondary | ICD-10-CM

## 2021-04-27 DIAGNOSIS — G514 Facial myokymia: Secondary | ICD-10-CM | POA: Diagnosis not present

## 2021-04-27 DIAGNOSIS — Z0000 Encounter for general adult medical examination without abnormal findings: Secondary | ICD-10-CM

## 2021-04-27 DIAGNOSIS — E781 Pure hyperglyceridemia: Secondary | ICD-10-CM

## 2021-04-27 DIAGNOSIS — E559 Vitamin D deficiency, unspecified: Secondary | ICD-10-CM | POA: Diagnosis not present

## 2021-04-27 DIAGNOSIS — Z780 Asymptomatic menopausal state: Secondary | ICD-10-CM

## 2021-04-27 LAB — CBC WITH DIFFERENTIAL/PLATELET
Absolute Monocytes: 400 cells/uL (ref 200–950)
Basophils Absolute: 83 cells/uL (ref 0–200)
Basophils Relative: 1.2 %
Eosinophils Absolute: 152 cells/uL (ref 15–500)
Eosinophils Relative: 2.2 %
HCT: 42.5 % (ref 35.0–45.0)
Hemoglobin: 14.2 g/dL (ref 11.7–15.5)
Lymphs Abs: 2312 cells/uL (ref 850–3900)
MCH: 30.5 pg (ref 27.0–33.0)
MCHC: 33.4 g/dL (ref 32.0–36.0)
MCV: 91.2 fL (ref 80.0–100.0)
MPV: 9.4 fL (ref 7.5–12.5)
Monocytes Relative: 5.8 %
Neutro Abs: 3954 cells/uL (ref 1500–7800)
Neutrophils Relative %: 57.3 %
Platelets: 317 10*3/uL (ref 140–400)
RBC: 4.66 10*6/uL (ref 3.80–5.10)
RDW: 12.4 % (ref 11.0–15.0)
Total Lymphocyte: 33.5 %
WBC: 6.9 10*3/uL (ref 3.8–10.8)

## 2021-04-27 LAB — COMPLETE METABOLIC PANEL WITH GFR
AG Ratio: 1.6 (calc) (ref 1.0–2.5)
ALT: 18 U/L (ref 6–29)
AST: 15 U/L (ref 10–35)
Albumin: 4.4 g/dL (ref 3.6–5.1)
Alkaline phosphatase (APISO): 71 U/L (ref 37–153)
BUN/Creatinine Ratio: 28 (calc) — ABNORMAL HIGH (ref 6–22)
BUN: 15 mg/dL (ref 7–25)
CO2: 27 mmol/L (ref 20–32)
Calcium: 9.5 mg/dL (ref 8.6–10.4)
Chloride: 101 mmol/L (ref 98–110)
Creat: 0.54 mg/dL — ABNORMAL LOW (ref 0.60–1.00)
Globulin: 2.8 g/dL (calc) (ref 1.9–3.7)
Glucose, Bld: 99 mg/dL (ref 65–99)
Potassium: 4 mmol/L (ref 3.5–5.3)
Sodium: 136 mmol/L (ref 135–146)
Total Bilirubin: 0.5 mg/dL (ref 0.2–1.2)
Total Protein: 7.2 g/dL (ref 6.1–8.1)
eGFR: 97 mL/min/{1.73_m2} (ref >=60)

## 2021-04-27 LAB — LIPID PANEL
Cholesterol: 201 mg/dL — ABNORMAL HIGH (ref <200)
HDL: 62 mg/dL (ref >=50)
LDL Cholesterol (Calc): 110 mg/dL (calc) — ABNORMAL HIGH
Non-HDL Cholesterol (Calc): 139 mg/dL (calc) — ABNORMAL HIGH (ref <130)
Total CHOL/HDL Ratio: 3.2 (calc) (ref <5.0)
Triglycerides: 175 mg/dL — ABNORMAL HIGH (ref <150)

## 2021-04-27 LAB — VITAMIN D 25 HYDROXY (VIT D DEFICIENCY, FRACTURES): Vit D, 25-Hydroxy: 32 ng/mL (ref 30–100)

## 2021-04-27 LAB — TSH: TSH: 1.13 mIU/L (ref 0.40–4.50)

## 2021-04-27 MED ORDER — LOSARTAN POTASSIUM-HCTZ 100-25 MG PO TABS: 1.0000 | ORAL_TABLET | Freq: Every day | ORAL | 3 refills | Status: DC

## 2021-04-27 NOTE — Progress Notes (Signed)
? ?Subjective:  ? ? Patient ID: Lynn Estes, female    DOB: 1945/04/04, 76 y.o.   MRN: [id number] ? ?HPI ?Patient is a very sweet 76 year old Caucasian female here today for a physical exam.  Patient had a colonoscopy in 2022 that showed severe diverticulosis.  Mammogram was performed in February and was normal.  Last bone density test was performed in 2019 and will be due.  Patient does not require Pap smear due to being greater than 65.  Immunization records are listed below. ?Immunization History  ?Administered Date(s) Administered  ? Fluad Quad(high Dose 65+) 10/03/2018  ? Influenza Split 11/10/2011, 10/03/2013  ? Influenza, High Dose Seasonal PF 12/05/2017  ? Influenza,inj,Quad PF,6+ Mos 11/22/2012  ? Influenza,inj,quad, With Preservative 10/04/2018  ? PFIZER(Purple Top)SARS-COV-2 Vaccination 02/09/2019, 03/02/2019  ? Pneumococcal Polysaccharide-23 09/10/2011, 10/03/2012  ? Pneumococcal-Unspecified 01/03/2017  ? Tdap 08/29/2011  ? ?As mentioned at her physical last year, she is due for the shingles vaccine but she has had shingles twice and therefore elects not to get the vaccine.  She denies any recent falls, depression, or memory loss.  Patient has 2 lesions on her posterior right shoulder that could either be irritated seborrheic keratoses or early squamous cell cancers.  Patient admits that she has been scratching at them but they do appear irritated, red, and scaly.  I have recommended a shave biopsy of both of these.  She would like to have them removed.  I am concerned because her blood pressure is high.  It was also recently high at her gynecologist. ?Past Medical History:  ?Diagnosis Date  ? Arthritis   ? generalized-bilateral hands  ? Asthma   ? hx of  ? Cataract   ? bilateral-LEFT eye  ? COPD (chronic obstructive pulmonary disease) ([place])   ? not being tx'd at this time (03/23/2020)  ? Diverticulitis   ? History of shingles   ? Hypertension   ? on meds  ? Lung nodules   ? Nodules noted on CT scan  July 2017  ? OSA (obstructive sleep apnea)   ? Partial small bowel obstruction ([place])   ? ?Past Surgical History:  ?Procedure Laterality Date  ? ANKLE SURGERY  2019  ? CATARACT EXTRACTION W/PHACO Left 06/08/2015  ? Procedure: CATARACT EXTRACTION PHACO AND INTRAOCULAR LENS PLACEMENT LEFT EYE CDE=7.18;  Surgeon: Williams Che, MD;  Location: [facility];  Service: Ophthalmology;  Laterality: Left;  ? DILATION AND CURETTAGE OF UTERUS    ? X 2  ? EXERCISE TOLERANCE TEST (GXT)  11/2016  ? Low Exercise Tolerance - 6:24 min (dyspnea, fatigue), upsloping ST segment depression noted.  Nondiagnostic.  HR 150 bpm = 97% MPHR). Hypertensive response to exercise. NEGATIVE test.  No arrhythmias.  ? right rotator cuff surgery    ? TRANSTHORACIC ECHOCARDIOGRAM  11/2016  ? Normal LV size and function.  GR 1 DD.  Moderate concentric hypertrophy.  Vigorous LV function with EF of 65-70%.  ? WISDOM TOOTH EXTRACTION    ? ?Current Outpatient Medications on File Prior to Visit  ?Medication Sig Dispense Refill  ? Calcium 500 MG tablet Take 600 mg by mouth 2 (two) times daily.    ? Cholecalciferol (VITAMIN D PO) Take 2,000 Units by mouth daily.    ? clobetasol (TEMOVATE) 0.05 % external solution Apply topically.    ? Cyanocobalamin (VITAMIN B 12 PO) Take by mouth.    ? diclofenac (VOLTAREN) 75 MG EC tablet TAKE ONE TABLET BY MOUTH TWICE DAILY  (Patient taking  differently: daily as needed.) 30 tablet 0  ? Fluocinolone Acetonide Scalp 0.01 % OIL Apply topically.    ? fluticasone (CUTIVATE) 0.05 % cream Apply 1 application topically daily.    ? hydrochlorothiazide (HYDRODIURIL) 25 MG tablet TAKE ONE TABLET BY MOUTH ONE TIME DAILY 90 tablet 3  ? Melatonin 5 MG TABS Take by mouth at bedtime as needed.    ? metoprolol tartrate (LOPRESSOR) 25 MG tablet TAKE ONE TABLET BY MOUTH ONE TIME DAILY 90 tablet 1  ? Omega-3 Fatty Acids (FISH OIL) 1000 MG CAPS Fish Oil    ? TURMERIC PO Take by mouth.    ? Zinc 50 MG TABS Take by mouth.    ? ?No current  facility-administered medications on file prior to visit.  ? ?No Known Allergies ?Social History  ? ?Socioeconomic History  ? Marital status: Married  ?  Spouse name: Nicole Kindred  ? Number of children: 2  ? Years of education: 34  ? Highest education level: Not on file  ?Occupational History  ? Occupation: Medical illustrator  ?  Comment: Chief Executive Officer  ?Tobacco Use  ? Smoking status: Never  ? Smokeless tobacco: Never  ?Vaping Use  ? Vaping Use: Never used  ?Substance and Sexual Activity  ? Alcohol use: Yes  ?  Alcohol/week: 14.0 standard drinks  ?  Types: 14 Standard drinks or equivalent per week  ? Drug use: No  ? Sexual activity: Not on file  ?Other Topics Concern  ? Not on file  ?Social History Narrative  ? PFT---08-20-2009   fev1-2.04, 92%, fev1/fvc 0.79  ? Widowed, remarried.  2 kids  ? Works part time Naval architect   ?   ? Lives with husband  ? Right handed  ? Caffeine: 2 cups of coffee in the AM  ? ?Social Determinants of Health  ? ?Financial Resource Strain: Not on file  ?Food Insecurity: Not on file  ?Transportation Needs: Not on file  ?Physical Activity: Not on file  ?Stress: Not on file  ?Social Connections: Not on file  ?Intimate Partner Violence: Not on file  ? ?Family History  ?Problem Relation Age of Onset  ? Hypertension Brother   ? Stroke Maternal Grandmother   ? Colon polyps Neg Hx   ? Colon cancer Neg Hx   ? Esophageal cancer Neg Hx   ? Rectal cancer Neg Hx   ? Stomach cancer Neg Hx   ? ? ? ? ?Review of Systems  ?All other systems reviewed and are negative. ? ?   ?Objective:  ? Physical Exam ?Vitals reviewed.  ?Constitutional:   ?   General: She is not in acute distress. ?   Appearance: She is well-developed. She is not diaphoretic.  ?HENT:  ?   Head: Normocephalic and atraumatic.  ?   Right Ear: External ear normal.  ?   Left Ear: External ear normal.  ?   Nose: Nose normal.  ?   Mouth/Throat:  ?   Pharynx: No oropharyngeal exudate.  ?Eyes:  ?   General: No scleral icterus.    ?    Right eye: No discharge.     ?   Left eye: No discharge.  ?   Conjunctiva/sclera: Conjunctivae normal.  ?   Pupils: Pupils are equal, round, and reactive to light.  ?Neck:  ?   Thyroid: No thyromegaly.  ?   Vascular: No JVD.  ?   Trachea: No tracheal deviation.  ?Cardiovascular:  ?   Rate and Rhythm:  Normal rate and regular rhythm.  ?   Heart sounds: Normal heart sounds. No murmur heard. ?  No friction rub. No gallop.  ?Pulmonary:  ?   Effort: Pulmonary effort is normal. No respiratory distress.  ?   Breath sounds: Normal breath sounds. No stridor. No wheezing or rales.  ?Chest:  ?   Chest wall: No tenderness.  ?Abdominal:  ?   General: Bowel sounds are normal. There is no distension.  ?   Palpations: Abdomen is soft. There is no mass.  ?   Tenderness: There is no abdominal tenderness. There is no guarding or rebound.  ?   Hernia: No hernia is present.  ?Musculoskeletal:  ?   Cervical back: Normal range of motion.  ?   Lumbar back: No spasms or tenderness. Normal range of motion.  ?   Right lower leg: No edema.  ?   Left lower leg: No edema.  ?Lymphadenopathy:  ?   Cervical: No cervical adenopathy.  ?Skin: ?   General: Skin is warm.  ?   Coloration: Skin is not jaundiced or pale.  ?   Findings: No bruising, erythema, lesion or rash.  ?Neurological:  ?   General: No focal deficit present.  ?   Mental Status: She is oriented to person, place, and time. Mental status is at baseline.  ?   Cranial Nerves: No cranial nerve deficit.  ?   Sensory: No sensory deficit.  ?   Motor: No weakness.  ?   Coordination: Coordination normal.  ?   Gait: Gait normal.  ?   Deep Tendon Reflexes: Reflexes normal.  ?Psychiatric:     ?   Mood and Affect: Mood normal.     ?   Behavior: Behavior normal.     ?   Thought Content: Thought content normal.     ?   Judgment: Judgment normal.  ? ? ? ? ? ?   ?Assessment & Plan:  ?General medical exam - Plan: CBC with Differential/Platelet, Lipid panel, COMPLETE METABOLIC PANEL WITH GFR, VITAMIN D 25  Hydroxy (Vit-D Deficiency, Fractures) ? ?Eyelid myokymia ? ?Vitamin D deficiency - Plan: VITAMIN D 25 Hydroxy (Vit-D Deficiency, Fractures) ? ?Hypertriglyceridemia - Plan: CBC with Differential/Platelet, Lipid

## 2021-06-02 ENCOUNTER — Ambulatory Visit: Payer: Medicare Other | Admitting: Adult Health

## 2021-07-08 ENCOUNTER — Telehealth: Payer: Self-pay | Admitting: Neurology

## 2021-07-08 NOTE — Telephone Encounter (Signed)
Pt said, have paid for Botox  on my own and did not seem to work. Do I need to keep the appt 08/17/21 or get a sooner appt. Eyes are switching, interfere with driving. Would like a call from the nurse.

## 2021-07-12 NOTE — Telephone Encounter (Signed)
I spoke to the patient. States she let her dermatologist, Dr. Jarvis Newcomer, inject Botox in October 2022 with little improvement. She waited until June 2023 to have a subsequent injection. No change in symptoms. Says that office did not submit for insurance coverage and she pain out-of-pocket.   She would still like to keep her consultation with Dr. Krista Blue on 08/17/21. She is going to bring her records from the previous two treatments for review.

## 2021-08-17 ENCOUNTER — Institutional Professional Consult (permissible substitution): Payer: Medicare Other | Admitting: Neurology

## 2021-08-30 ENCOUNTER — Telehealth: Payer: Self-pay | Admitting: Neurology

## 2021-08-30 NOTE — Telephone Encounter (Signed)
Pt is asking for a call to discuss options other than a mask for her sleep apnea

## 2021-08-30 NOTE — Telephone Encounter (Signed)
Jael, can you please call pt and schedule an appt with Dr. Brett Fairy? She will need to come in for appt to discuss since it has been almost a yr since last visit. Thank you

## 2021-09-10 ENCOUNTER — Telehealth: Payer: Self-pay

## 2021-09-10 NOTE — Telephone Encounter (Signed)
Pt called stating she fell in May and hurt R leg. Pt states she saw her Orthopedic and they x-rayed her hip but everything was fine. Pt states she is still having issues with pain, ambulation and is unable to lift her leg at times. Pt asks if she could be referred to [facility] for nerve pain? Thank you.

## 2021-09-13 ENCOUNTER — Ambulatory Visit (INDEPENDENT_AMBULATORY_CARE_PROVIDER_SITE_OTHER): Payer: Medicare Other | Admitting: Family Medicine

## 2021-09-13 VITALS — BP 124/72 | HR 79 | Temp 98.4°F | Ht 65.0 in | Wt 201.6 lb

## 2021-09-13 DIAGNOSIS — M5431 Sciatica, right side: Secondary | ICD-10-CM | POA: Diagnosis not present

## 2021-09-13 NOTE — Progress Notes (Signed)
Subjective:    Patient ID: Lynn Estes, female    DOB: 1945/05/08, 76 y.o.   MRN: [id number]  Leg Pain   Patient states she has been dealing with leg pain for the last 6 months.  The pain starts in her posterior right hip and radiates down the right leg.  Is a burning intense pain that radiates all the way down into her foot.  I tried her on prednisone.  She even saw her orthopedist in July we did an x-ray of her right hip which was relatively normal.  The pain is more neuropathic in nature and is consistent with sciatica.  She reports pain upon standing.  She reports weakness in the right leg.  She denies any bowel or bladder incontinence.  She denies any saddle anesthesia.  She does have decreased strength in her right leg.  She denies any numbness in the foot.  She has a hard time standing for prolonged periods of time as the pain intensified in her posterior right hip with prolonged standing Past Medical History:  Diagnosis Date   Arthritis    generalized-bilateral hands   Asthma    hx of   Cataract    bilateral-LEFT eye   COPD (chronic obstructive pulmonary disease) ([place])    not being tx'd at this time (03/23/2020)   Diverticulitis    History of shingles    Hypertension    on meds   Lung nodules    Nodules noted on CT scan July 2017   OSA (obstructive sleep apnea)    Partial small bowel obstruction [facility])    Past Surgical History:  Procedure Laterality Date   ANKLE SURGERY  2019   CATARACT EXTRACTION W/PHACO Left 06/08/2015   Procedure: CATARACT EXTRACTION PHACO AND INTRAOCULAR LENS PLACEMENT LEFT EYE CDE=7.18;  Surgeon: Williams Che, MD;  Location: [facility];  Service: Ophthalmology;  Laterality: Left;   DILATION AND CURETTAGE OF UTERUS     X 2   EXERCISE TOLERANCE TEST (GXT)  11/2016   Low Exercise Tolerance - 6:24 min (dyspnea, fatigue), upsloping ST segment depression noted.  Nondiagnostic.  HR 150 bpm = 97% MPHR). Hypertensive response to exercise. NEGATIVE test.  No  arrhythmias.   right rotator cuff surgery     TRANSTHORACIC ECHOCARDIOGRAM  11/2016   Normal LV size and function.  GR 1 DD.  Moderate concentric hypertrophy.  Vigorous LV function with EF of 65-70%.   WISDOM TOOTH EXTRACTION     Current Outpatient Medications on File Prior to Visit  Medication Sig Dispense Refill   Calcium 500 MG tablet Take 600 mg by mouth 2 (two) times daily.     Cholecalciferol (VITAMIN D PO) Take 2,000 Units by mouth daily.     clobetasol (TEMOVATE) 0.05 % external solution Apply topically.     Cyanocobalamin (VITAMIN B 12 PO) Take by mouth.     diclofenac (VOLTAREN) 75 MG EC tablet TAKE ONE TABLET BY MOUTH TWICE DAILY  (Patient taking differently: daily as needed.) 30 tablet 0   Fluocinolone Acetonide Scalp 0.01 % OIL Apply topically.     fluticasone (CUTIVATE) 0.05 % cream Apply 1 application topically daily.     losartan-hydrochlorothiazide (HYZAAR) 100-25 MG tablet Take 1 tablet by mouth daily. 90 tablet 3   Melatonin 5 MG TABS Take by mouth at bedtime as needed.     metoprolol tartrate (LOPRESSOR) 25 MG tablet TAKE ONE TABLET BY MOUTH ONE TIME DAILY 90 tablet 1   Omega-3 Fatty Acids (  FISH OIL) 1000 MG CAPS Fish Oil     TURMERIC PO Take by mouth.     Zinc 50 MG TABS Take by mouth.     No current facility-administered medications on file prior to visit.   No Known Allergies Social History   Socioeconomic History   Marital status: Married    Spouse name: Nicole Kindred   Number of children: 2   Years of education: 13   Highest education level: Not on file  Occupational History   Occupation: Medical illustrator    Comment: Chief Executive Officer  Tobacco Use   Smoking status: Never   Smokeless tobacco: Never  Vaping Use   Vaping Use: Never used  Substance and Sexual Activity   Alcohol use: Yes    Alcohol/week: 14.0 standard drinks of alcohol    Types: 14 Standard drinks or equivalent per week   Drug use: No   Sexual activity: Not on file  Other Topics Concern    Not on file  Social History Narrative   PFT---08-20-2009   fev1-2.04, 92%, fev1/fvc 0.79   Widowed, remarried.  2 kids   Works part time [employer]       Lives with husband   Right handed   Caffeine: 2 cups of coffee in the AM   Social Determinants of Health   Financial Resource Strain: Not on file  Food Insecurity: Not on file  Transportation Needs: Not on file  Physical Activity: Sufficiently Active (11/24/2016)   Exercise Vital Sign    Days of Exercise per Week: 5 days    Minutes of Exercise per Session: 40 min  Stress: Not on file  Social Connections: Not on file  Intimate Partner Violence: Not on file   Family History  Problem Relation Age of Onset   Hypertension Brother    Stroke Maternal Grandmother    Colon polyps Neg Hx    Colon cancer Neg Hx    Esophageal cancer Neg Hx    Rectal cancer Neg Hx    Stomach cancer Neg Hx       Review of Systems  All other systems reviewed and are negative.      Objective:   Physical Exam Vitals reviewed.  Constitutional:      General: She is not in acute distress.    Appearance: She is well-developed. She is not diaphoretic.  HENT:     Head: Normocephalic and atraumatic.     Right Ear: External ear normal.     Left Ear: External ear normal.     Nose: Nose normal.     Mouth/Throat:     Pharynx: No oropharyngeal exudate.  Eyes:     General: No scleral icterus.       Right eye: No discharge.        Left eye: No discharge.     Conjunctiva/sclera: Conjunctivae normal.     Pupils: Pupils are equal, round, and reactive to light.  Neck:     Thyroid: No thyromegaly.     Vascular: No JVD.     Trachea: No tracheal deviation.  Cardiovascular:     Rate and Rhythm: Normal rate and regular rhythm.     Heart sounds: Normal heart sounds. No murmur heard.    No friction rub. No gallop.  Pulmonary:     Effort: Pulmonary effort is normal. No respiratory distress.     Breath sounds: Normal breath sounds.  No stridor. No wheezing or rales.  Chest:     Chest wall:  No tenderness.  Abdominal:     General: Bowel sounds are normal. There is no distension.     Palpations: Abdomen is soft. There is no mass.     Tenderness: There is no abdominal tenderness. There is no guarding or rebound.     Hernia: No hernia is present.  Musculoskeletal:     Cervical back: Normal range of motion.     Lumbar back: No spasms or tenderness. Decreased range of motion. Positive right straight leg raise test.     Right lower leg: No edema.     Left lower leg: No edema.       Legs:  Lymphadenopathy:     Cervical: No cervical adenopathy.  Skin:    General: Skin is warm.     Coloration: Skin is not jaundiced or pale.     Findings: No bruising, erythema, lesion or rash.  Neurological:     General: No focal deficit present.     Mental Status: She is oriented to person, place, and time. Mental status is at baseline.     Cranial Nerves: No cranial nerve deficit.     Sensory: No sensory deficit.     Motor: No weakness.     Coordination: Coordination normal.     Gait: Gait normal.     Deep Tendon Reflexes: Reflexes normal.  Psychiatric:        Mood and Affect: Mood normal.        Behavior: Behavior normal.        Thought Content: Thought content normal.        Judgment: Judgment normal.           Assessment & Plan:  Right sided sciatica - Plan: DG Lumbar Spine Complete Start by obtaining an x-ray of the lumbar spine.  Symptoms are consistent with right-sided sciatica.  Symptoms been present now for 6 months and failed prednisone, diclofenac.  Patient has had an x-ray of the right hip.  If x-ray of her lower back reveals no specific etiology I would recommend an MRI of the lumbar spine as patient has failed conservative management

## 2021-09-14 ENCOUNTER — Ambulatory Visit
Admission: RE | Admit: 2021-09-14 | Discharge: 2021-09-14 | Disposition: A | Discharge status: Home / Self Care | Payer: Medicare Other | Source: Ambulatory Visit | Attending: Family Medicine | Admitting: Family Medicine

## 2021-09-14 DIAGNOSIS — M5431 Sciatica, right side: Secondary | ICD-10-CM

## 2021-09-20 ENCOUNTER — Other Ambulatory Visit: Payer: Self-pay | Admitting: Family Medicine

## 2021-09-20 ENCOUNTER — Ambulatory Visit (INDEPENDENT_AMBULATORY_CARE_PROVIDER_SITE_OTHER): Payer: Medicare Other | Admitting: Family Medicine

## 2021-09-20 ENCOUNTER — Telehealth: Payer: Self-pay

## 2021-09-20 VITALS — BP 120/60 | HR 73 | Temp 97.8°F | Ht 65.0 in | Wt 202.6 lb

## 2021-09-20 DIAGNOSIS — M5431 Sciatica, right side: Secondary | ICD-10-CM | POA: Diagnosis not present

## 2021-09-20 MED ORDER — OMEPRAZOLE 40 MG PO CPDR: 40.0000 mg | DELAYED_RELEASE_CAPSULE | Freq: Every day | ORAL | 3 refills | Status: DC

## 2021-09-20 NOTE — Telephone Encounter (Signed)
Pt called back stating she would like to try doing PT, if her insurance will cover it. Okay to put in order for PT evaluation and treatment? Thank you.

## 2021-09-20 NOTE — Progress Notes (Signed)
Subjective:    Patient ID: Lynn Estes, female    DOB: 07/13/1945, 76 y.o.   MRN: [id number]  Leg Pain   09/13/21 Patient states she has been dealing with leg pain for the last 6 months.  The pain starts in her posterior right hip and radiates down the right leg.  Is a burning intense pain that radiates all the way down into her foot.  I tried her on prednisone.  She even saw her orthopedist in July we did an x-ray of her right hip which was relatively normal.  The pain is more neuropathic in nature and is consistent with sciatica.  She reports pain upon standing.  She reports weakness in the right leg.  She denies any bowel or bladder incontinence.  She denies any saddle anesthesia.  She does have decreased strength in her right leg.  She denies any numbness in the foot.  She has a hard time standing for prolonged periods of time as the pain intensified in her posterior right hip with prolonged standing.  At that time, my plan was:  Start by obtaining an x-ray of the lumbar spine.  Symptoms are consistent with right-sided sciatica.  Symptoms been present now for 6 months and failed prednisone, diclofenac.  Patient has had an x-ray of the right hip.  If x-ray of her lower back reveals no specific etiology I would recommend an MRI of the lumbar spine as patient has failed conservative management  09/20/21 Xray shows: Mild disc space narrowing L2-L3 and L3-L4 with multilevel degenerative osteophyte. Facet degenerative changes of the lower lumbar spine.  Patient continues to have significant pain in her right leg.  When I previously saw her she was unable to bear weight on her right leg.  Thankfully that is gotten better with some stretches that she is done.  The neuropathic pain continues to radiate down her leg and she still has pain every time she flexes her right hip.  However the pain is slightly better on the diclofenac Past Medical History:  Diagnosis Date   Arthritis     generalized-bilateral hands   Asthma    hx of   Cataract    bilateral-LEFT eye   COPD (chronic obstructive pulmonary disease) ([place])    not being tx'd at this time (03/23/2020)   Diverticulitis    History of shingles    Hypertension    on meds   Lung nodules    Nodules noted on CT scan July 2017   OSA (obstructive sleep apnea)    Partial small bowel obstruction [facility])    Past Surgical History:  Procedure Laterality Date   ANKLE SURGERY  2019   CATARACT EXTRACTION W/PHACO Left 06/08/2015   Procedure: CATARACT EXTRACTION PHACO AND INTRAOCULAR LENS PLACEMENT LEFT EYE CDE=7.18;  Surgeon: Williams Che, MD;  Location: [facility];  Service: Ophthalmology;  Laterality: Left;   DILATION AND CURETTAGE OF UTERUS     X 2   EXERCISE TOLERANCE TEST (GXT)  11/2016   Low Exercise Tolerance - 6:24 min (dyspnea, fatigue), upsloping ST segment depression noted.  Nondiagnostic.  HR 150 bpm = 97% MPHR). Hypertensive response to exercise. NEGATIVE test.  No arrhythmias.   right rotator cuff surgery     TRANSTHORACIC ECHOCARDIOGRAM  11/2016   Normal LV size and function.  GR 1 DD.  Moderate concentric hypertrophy.  Vigorous LV function with EF of 65-70%.   WISDOM TOOTH EXTRACTION     Current Outpatient Medications on File Prior  to Visit  Medication Sig Dispense Refill   Calcium 500 MG tablet Take 600 mg by mouth 2 (two) times daily.     Cholecalciferol (VITAMIN D PO) Take 2,000 Units by mouth daily.     clobetasol (TEMOVATE) 0.05 % external solution Apply topically.     Cyanocobalamin (VITAMIN B 12 PO) Take by mouth.     diclofenac (VOLTAREN) 75 MG EC tablet TAKE ONE TABLET BY MOUTH TWICE DAILY  (Patient taking differently: daily as needed.) 30 tablet 0   Fluocinolone Acetonide Scalp 0.01 % OIL Apply topically.     fluticasone (CUTIVATE) 0.05 % cream Apply 1 application topically daily.     losartan-hydrochlorothiazide (HYZAAR) 100-25 MG tablet Take 1 tablet by mouth daily. 90 tablet 3   Melatonin 5 MG  TABS Take by mouth at bedtime as needed.     metoprolol tartrate (LOPRESSOR) 25 MG tablet TAKE ONE TABLET BY MOUTH ONE TIME DAILY 90 tablet 1   Omega-3 Fatty Acids (FISH OIL) 1000 MG CAPS Fish Oil     TURMERIC PO Take by mouth.     Zinc 50 MG TABS Take by mouth.     No current facility-administered medications on file prior to visit.   No Known Allergies Social History   Socioeconomic History   Marital status: Married    Spouse name: Nicole Kindred   Number of children: 2   Years of education: 13   Highest education level: Not on file  Occupational History   Occupation: Medical illustrator    Comment: Chief Executive Officer  Tobacco Use   Smoking status: Never   Smokeless tobacco: Never  Vaping Use   Vaping Use: Never used  Substance and Sexual Activity   Alcohol use: Yes    Alcohol/week: 14.0 standard drinks of alcohol    Types: 14 Standard drinks or equivalent per week   Drug use: No   Sexual activity: Not on file  Other Topics Concern   Not on file  Social History Narrative   PFT---08-20-2009   fev1-2.04, 92%, fev1/fvc 0.79   Widowed, remarried.  2 kids   Works part time [employer]       Lives with husband   Right handed   Caffeine: 2 cups of coffee in the AM   Social Determinants of Health   Financial Resource Strain: Not on file  Food Insecurity: Not on file  Transportation Needs: Not on file  Physical Activity: Sufficiently Active (11/24/2016)   Exercise Vital Sign    Days of Exercise per Week: 5 days    Minutes of Exercise per Session: 40 min  Stress: Not on file  Social Connections: Not on file  Intimate Partner Violence: Not on file   Family History  Problem Relation Age of Onset   Hypertension Brother    Stroke Maternal Grandmother    Colon polyps Neg Hx    Colon cancer Neg Hx    Esophageal cancer Neg Hx    Rectal cancer Neg Hx    Stomach cancer Neg Hx       Review of Systems  All other systems reviewed and are negative.       Objective:   Physical Exam Vitals reviewed.  Constitutional:      General: She is not in acute distress.    Appearance: She is well-developed. She is not diaphoretic.  HENT:     Head: Normocephalic and atraumatic.     Right Ear: External ear normal.     Left Ear: External  ear normal.     Nose: Nose normal.     Mouth/Throat:     Pharynx: No oropharyngeal exudate.  Eyes:     General: No scleral icterus.       Right eye: No discharge.        Left eye: No discharge.     Conjunctiva/sclera: Conjunctivae normal.     Pupils: Pupils are equal, round, and reactive to light.  Neck:     Thyroid: No thyromegaly.     Vascular: No JVD.     Trachea: No tracheal deviation.  Cardiovascular:     Rate and Rhythm: Normal rate and regular rhythm.     Heart sounds: Normal heart sounds. No murmur heard.    No friction rub. No gallop.  Pulmonary:     Effort: Pulmonary effort is normal. No respiratory distress.     Breath sounds: Normal breath sounds. No stridor. No wheezing or rales.  Chest:     Chest wall: No tenderness.  Abdominal:     General: Bowel sounds are normal. There is no distension.     Palpations: Abdomen is soft. There is no mass.     Tenderness: There is no abdominal tenderness. There is no guarding or rebound.     Hernia: No hernia is present.  Musculoskeletal:     Cervical back: Normal range of motion.     Lumbar back: No spasms or tenderness. Decreased range of motion. Positive right straight leg raise test.     Right lower leg: No edema.     Left lower leg: No edema.       Legs:  Lymphadenopathy:     Cervical: No cervical adenopathy.  Skin:    General: Skin is warm.     Coloration: Skin is not jaundiced or pale.     Findings: No bruising, erythema, lesion or rash.  Neurological:     General: No focal deficit present.     Mental Status: She is oriented to person, place, and time. Mental status is at baseline.     Cranial Nerves: No cranial nerve deficit.     Sensory:  No sensory deficit.     Motor: No weakness.     Coordination: Coordination normal.     Gait: Gait normal.     Deep Tendon Reflexes: Reflexes normal.  Psychiatric:        Mood and Affect: Mood normal.        Behavior: Behavior normal.        Thought Content: Thought content normal.        Judgment: Judgment normal.           Assessment & Plan:  Right sided sciatica We discussed her options.  We discussed physical therapy versus an MRI versus gabapentin.  The patient elects to continue diclofenac at the present time and defers physical therapy or an MRI given that the pain is slightly better.  May proceed with an MRI if the pain worsens in the future

## 2021-09-21 ENCOUNTER — Ambulatory Visit (INDEPENDENT_AMBULATORY_CARE_PROVIDER_SITE_OTHER): Payer: Medicare Other | Admitting: Neurology

## 2021-09-21 ENCOUNTER — Encounter: Payer: Self-pay | Admitting: Neurology

## 2021-09-21 VITALS — BP 176/97 | HR 77 | Ht 65.0 in | Wt 203.4 lb

## 2021-09-21 DIAGNOSIS — G4733 Obstructive sleep apnea (adult) (pediatric): Secondary | ICD-10-CM

## 2021-09-21 DIAGNOSIS — G5139 Clonic hemifacial spasm, unspecified: Secondary | ICD-10-CM | POA: Diagnosis not present

## 2021-09-21 DIAGNOSIS — Z9989 Dependence on other enabling machines and devices: Secondary | ICD-10-CM

## 2021-09-21 NOTE — Progress Notes (Signed)
PATIENT: Lynn Estes DOB: 1945-08-13  REASON FOR VISIT: eye movements, diplopia.   HISTORY FROM: patient Primary neurologist: Dr. Brett Fairy  NEW PRESENT ILLNESS:" I like to try a different mask 'RV  09/21/21: Lynn Estes   CD_ Today for follow up on CPAP - 09-21-2021: I have the pleasure to meet today again with echo Eyman who prefers to go by the name Lynn Estes.  She is a meanwhile 76 year old Caucasian female whom I first encountered in February 2020 when she was sent to the sleep clinic after she was witnessed to have become hypoxemic during an ankle surgery.  In March 2020 she underwent a sleep study and was placed on CPAP.  My last encounter with her was on 31 August last year when she presented with facial spasms.  She states that she has sometimes a hard time going to sleep on the CPAP and usually gets a good 5 hours she would like to see some changes in her interface the mask type.  Recently she had a visitor to her house to use the CPAP with a nasal pillow and she was intrigued if that could be working for her. Her CPAP download shows that she is a very compliant user 29 out of 30 days or 97% compliance by days but she struggles with a 4-hour limit at 75% compliance her minimum pressure setting for this autotitrator is 5 and her maximum pressure of 15 cm water with 3 cm expiratory pressure relief.  She does have some higher air leakage than I usually like 23 L/min her 95th percentile pressure is 11.3 cm water.  Her residual AHI is a dream that 0.5/h.  So the settings worked well for her but the interface should be changed.  When we met last her fatigue score was endorsed at 45 out of 63 points in 2022 and today at 41 out of 63 points.  The Epworth Sleepiness Scale was originally endorsed at 12 out of 24 points and is today much reduced at 3 out of 24 points.  She does not struggle with depression.  CPAP has made a difference when she can use it for 6 hours or longer.   She feels that then her sleep is deeper and more restorative.   The patient was placed on oxcarbazepine in the treatment of facial tics  in 2022 and she had seen my colleague Dr. Krista Blue in consultation.  She had hyponatremia in 2022, She had cataract surgery a few weeks ago. [place] travel I May 2023, enjoyed it and came back with COVID, fully vaccinated she had only mild disease.    Internal referral for eyelid myokymia. Last 7-8 months, blinking has been more constant. pt states she cant keep her eyes open, comes in different spells. R eye mainly. Causing problems w her driving.  RV  09/21/21: Lynn Estes is a 76 year old female who has ben seen here for obstructive sleep apnea on CPAP. The patient presents for a new medical problem, not sleep related. Lynn Estes is a 76 year old female  has a past medical history of Arthritis, Asthma, Cataract, COPD (chronic obstructive pulmonary disease) ([place]), Diverticulitis, History of shingles, Hypertension, Lung nodules, OSA (obstructive sleep apnea), and Partial small bowel obstruction ([place]). here with: uncontrollable eye movements, eye blinking.she denies diplopia. It bothers her to have to squint her eyes all the time without her control. Does not have dry eyes and is not wearing contact.  TIC ? Has seen  Wyatt Portela, MD . Lynn Estes is a 76 year old female who has ben seen here for obstructive sleep apnea on CPAP.   She states that she has not used the CPAP since October.  She states that she was struggling with the mask leaking and it was waking her husband up.  She states that she had a full facemask. She does want to get restarted on the CPAP but  reports daytime sleepiness.  Air has not leaked into her eyes, she quit using it 4 month ago.   07/24/19: [name] is a 76 year old female with a history of obstructive sleep apnea on CPAP.  Her download indicates that she use her machine nightly for compliance of 100%.  She  used her machine greater than 4 hours each night.  On average she uses her machine 7 hours and 47 minutes.  Her residual AHI is 0.4 on 5 to 15 cm of water with EPR 3.  Leak in the 95th percentile is 27 L/min.  She states that she does have some fatigue but feels this is related to the pandemic.  She states that sometimes she will wake up at night but she is typically able to go back to sleep in approximately 1 hour.  HISTORY 05/21/18 Lynn Estes is a 76 y.o. female for follow up of OSA on CPAP. She has used CPAP every night but does mention that she has moisture buildup in mask that runs down her cheek. She is using full face mask. She has noted benefit with increased energy but is concerned about leak.    Download report as follows:    REVIEW OF SYSTEMS: Out of a complete 14 system review of symptoms, the patient complains only of the following symptoms, and all other reviewed systems are negative.  Facial tic, compulsion to close her eyes, blinking, incessantly.  How likely are you to doze in the following situations: 0 = not likely, 1 = slight chance, 2 = moderate chance, 3 = high chance  Sitting and Reading? Watching Television? Sitting inactive in a public place (theater or meeting)? Lying down in the afternoon when circumstances permit? Sitting and talking to someone? Sitting quietly after lunch without alcohol? In a car, while stopped for a few minutes in traffic? As a passenger in a car for an hour without a break?  Total =  12/ 24  FSS at 45 / 63 points.  GDS 3/ 15 points   ALLERGIES: No Known Allergies  HOME MEDICATIONS: Outpatient Medications Prior to Visit  Medication Sig Dispense Refill   Calcium 500 MG tablet Take 600 mg by mouth 2 (two) times daily.     Cholecalciferol (VITAMIN D PO) Take 2,000 Units by mouth daily.     clobetasol (TEMOVATE) 0.05 % external solution Apply topically.     Cyanocobalamin (VITAMIN B 12 PO) Take by mouth.     diclofenac  (VOLTAREN) 75 MG EC tablet TAKE ONE TABLET BY MOUTH TWICE DAILY  (Patient taking differently: daily as needed.) 30 tablet 0   Fluocinolone Acetonide Scalp 0.01 % OIL Apply topically.     fluticasone (CUTIVATE) 0.05 % cream Apply 1 application topically daily.     losartan-hydrochlorothiazide (HYZAAR) 100-25 MG tablet Take 1 tablet by mouth daily. 90 tablet 3   metoprolol tartrate (LOPRESSOR) 25 MG tablet TAKE ONE TABLET BY MOUTH ONE TIME DAILY 90 tablet 1   Omega-3 Fatty Acids (FISH OIL) 1000 MG CAPS Fish Oil     omeprazole (PRILOSEC)  40 MG capsule Take 1 capsule (40 mg total) by mouth daily. 30 capsule 3   TURMERIC PO Take by mouth.     Zinc 50 MG TABS Take by mouth.     Melatonin 5 MG TABS Take by mouth at bedtime as needed. (Patient not taking: Reported on 09/21/2021)     No facility-administered medications prior to visit.    PAST MEDICAL HISTORY: Past Medical History:  Diagnosis Date   Arthritis    generalized-bilateral hands   Asthma    hx of   Cataract    bilateral-LEFT eye   COPD (chronic obstructive pulmonary disease) ([place])    not being tx'd at this time (03/23/2020)   Diverticulitis    History of shingles    Hypertension    on meds   Lung nodules    Nodules noted on CT scan July 2017   OSA (obstructive sleep apnea)    Partial small bowel obstruction ([place])     PAST SURGICAL HISTORY: Past Surgical History:  Procedure Laterality Date   ANKLE SURGERY  2019   CATARACT EXTRACTION W/PHACO Left 06/08/2015   Procedure: CATARACT EXTRACTION PHACO AND INTRAOCULAR LENS PLACEMENT LEFT EYE CDE=7.18;  Surgeon: Williams Che, MD;  Location: [facility];  Service: Ophthalmology;  Laterality: Left;   DILATION AND CURETTAGE OF UTERUS     X 2   EXERCISE TOLERANCE TEST (GXT)  11/2016   Low Exercise Tolerance - 6:24 min (dyspnea, fatigue), upsloping ST segment depression noted.  Nondiagnostic.  HR 150 bpm = 97% MPHR). Hypertensive response to exercise. NEGATIVE test.  No arrhythmias.    right rotator cuff surgery     TRANSTHORACIC ECHOCARDIOGRAM  11/2016   Normal LV size and function.  GR 1 DD.  Moderate concentric hypertrophy.  Vigorous LV function with EF of 65-70%.   WISDOM TOOTH EXTRACTION      FAMILY HISTORY: Family History  Problem Relation Age of Onset   Hypertension Brother    Stroke Maternal Grandmother    Colon polyps Neg Hx    Colon cancer Neg Hx    Esophageal cancer Neg Hx    Rectal cancer Neg Hx    Stomach cancer Neg Hx     SOCIAL HISTORY: Social History   Socioeconomic History   Marital status: Married    Spouse name: Nicole Kindred   Number of children: 2   Years of education: 13   Highest education level: Not on file  Occupational History   Occupation: Medical illustrator    Comment: Chief Executive Officer  Tobacco Use   Smoking status: Never   Smokeless tobacco: Never  Vaping Use   Vaping Use: Never used  Substance and Sexual Activity   Alcohol use: Yes    Alcohol/week: 14.0 standard drinks of alcohol    Types: 14 Standard drinks or equivalent per week   Drug use: No   Sexual activity: Not on file  Other Topics Concern   Not on file  Social History Narrative   PFT---08-20-2009   fev1-2.04, 92%, fev1/fvc 0.79   Widowed, remarried.  2 kids   Works part time Naval architect       Lives with husband   Right handed   Caffeine: 2 cups of coffee in the AM   Social Determinants of Health   Financial Resource Strain: Not on file  Food Insecurity: Not on file  Transportation Needs: Not on file  Physical Activity: Sufficiently Active (11/24/2016)   Exercise Vital Sign    Days of  Exercise per Week: 5 days    Minutes of Exercise per Session: 40 min  Stress: Not on file  Social Connections: Not on file  Intimate Partner Violence: Not on file      PHYSICAL EXAM  Vitals:   09/21/21 0912  BP: (!) 176/97  Pulse: 77  Weight: 203 lb 6.4 oz (92.3 kg)  Height: _0  (1.651 m)   Body mass index is 33.85 kg/m.  Generalized:  Well developed, in no acute distress  Chest: Lungs clear to auscultation bilaterally  Neurological examination  Mentation: Alert oriented to time, place, history taking. Follows all commands , normal unpressured speech and language fluent Cranial nerve : no loss of smell or taste .  Equal pupils, no proosis,  Extraocular movements were full, visual field were full on confrontational test . Head turning and shoulder shrug  were normal and symmetric. Motor:  symmetric motor tone is noted throughout., equal bulk and strength.  Sensory: decreased in toes, vibration is absent - but is present at the ankle.  Gait and station: Gait is normal on even surface- has fallen on uneven surfaces.    DIAGNOSTIC DATA (LABS, IMAGING, TESTING) - I reviewed patient records, labs, notes, testing and imaging myself where available.  Lab Results  Component Value Date   WBC 6.9 04/27/2021   HGB 14.2 04/27/2021   HCT 42.5 04/27/2021   MCV 91.2 04/27/2021   PLT 317 04/27/2021      Component Value Date/Time   NA 136 04/27/2021 0906   K 4.0 04/27/2021 0906   CL 101 04/27/2021 0906   CO2 27 04/27/2021 0906   GLUCOSE 99 04/27/2021 0906   BUN 15 04/27/2021 0906   CREATININE 0.54 (L) 04/27/2021 0906   CALCIUM 9.5 04/27/2021 0906   PROT 7.2 04/27/2021 0906   ALBUMIN 4.0 07/30/2014 2013   AST 15 04/27/2021 0906   ALT 18 04/27/2021 0906   ALKPHOS 67 07/30/2014 2013   BILITOT 0.5 04/27/2021 0906   GFRNONAA >60 08/22/2020 0738   GFRNONAA 91 03/30/2020 0908   GFRAA 105 03/30/2020 0908   Lab Results  Component Value Date   CHOL 201 (H) 04/27/2021   HDL 62 04/27/2021   LDLCALC 110 (H) 04/27/2021   TRIG 175 (H) 04/27/2021   CHOLHDL 3.2 04/27/2021       ASSESSMENT AND PLAN:  Very good resolution of apnea on CPAP, low AHI/h. She should continue to use CPAP at current settings and needs to try another interface. I like for her to try a nasal cradle. Asking DME for fitting.    Will try low dose  tegretol as a treatment.  Will consider facial botox, evaluation with Dr Krista Blue pedning.   Larey Seat, MD  09/21/2021, 9:49 AM [facility] [address], [place], [place] [address] [phone]

## 2021-09-21 NOTE — Patient Instructions (Signed)
KEEPING IT CLEAN: CPAP HYGIENE PROPER UPKEEP OF YOUR CPAP MACHINE CAN HELP ENSURE THE DEVICE FUNCTIONS PROPERLY CPAP CLEANING INSTRUCTIONS Along with proper CPAP cleaning it is recommended that you replace your mask, tubing and filters once very 3 months and more frequently if you are sick.   DAILY CLEANING Do not use moisturizing soaps, bleach, scented oils, chlorine, or alcohol-based solutions to clean your supplies. These solutions may cause irritation to your skin and lungs and may reduce the life of your products. Dawn [employer] or Comparable works best for daily cleaning.  **If you've been sick, it's smart to wash your mask, tubing, humidifier and filter daily until your cold, flu or virus symptoms are gone. That can help reduce the amount of time you spend under the weather.  Before using your mask -wash your face daily with soap and water to remove excess facial oils. Wipe down your mask (including areas that come in contact with your skin) using a damp towel with soap and warm water. This will remove any oils, dead skin cells, and sweat on the mask that can affect the quality of the seal. Gently rinse with a clean towel and let the mask air-dry out of direct sunlight. You can also use unscented baby wipes or pre-moistened towels designed specifically for cleaning CPAP masks, which are available on-line. DO NOT USE CLOROX OR DISINFECTING WIPES. If your unit has a humidifier, empty any leftover water instead of letting in sit in the unit all day. Refill the humidifier with clean, distilled water right before bedtime for optimal use WEEKLY (OR MORE FREQUENT) CLEANING Your mask and tubing need a full bath at least once a week to keep it free of dust, bacteria, and germs. (During COVID-19 or any other flu/virus we recommend more frequent cleaning) Clean the CPAP tubing, nasal mask, and headgear in a bathroom sink filled with warm water and a few drops of ammonia-free, mild dish detergent. Avoid  using stronger cleaning products, as they may damage the mask or leave harmful residue. Swirl all parts around for about five minutes, rinse well and let air dry during the day. Hang the tubing over the shower rod, on a towel rack or in the laundry room to ensure all the water drips out. The mask and headgear can be air-dried on a towel or hung on a hook or hanger. You should also wipe down your CPAP machine with a damp cloth. Ensure the unit is unplugged. The towel shouldn't be too damp or wet, as water could get into the machine. Clean the filter by removing it and rinsing it in warm tap water. Run it under the water and squeeze to make sure there is no dust. Then blot down the filter with a towel. Do not wash your machine's white filter, if one is present--those are disposable and should be replaced every two weeks. If you are recovering from being sick, we recommend changing the filter sooner. If your CPAP has a humidifier, that also needs to be cleaned weekly. Empty any remaining water and then wash the water chamber in the sink with warm soapy water. Rinse well and drain out as much of the water as possible. Let the chamber air-dry before placing it back into the CPAP unit. Every other week you should disinfect the humidifier. Do that by soaking it in a solution of one-part vinegar to five parts water for 30 minutes, thoroughly rinsing and then placing in your dishwasher's top rack for washing. And keep  it clean by using only distilled water to prevent mineral deposits that can build up and cause damage to your machine. IMPORTANT TIPS Make caring for your CPAP equipment part of your morning routine. Keep machine and accessories out of direct sunlight to avoid damaging them. Never use bleach to clean accessories. Place machine on a level surface and away from curtains that may interfere with the air intake. Keep track of when you should order replacement parts for your mask and accessories so that  you always get the most out of your CPAP. You can also sign up for [employer] by contacting our DME department [place] [employer] (plain).  With a little upkeep, your CPAP can continue to help you breathe better for a long time. Just a few minutes a day can help keep your CPAP running efficiently for years to come.

## 2021-09-22 ENCOUNTER — Telehealth: Payer: Self-pay

## 2021-09-22 NOTE — Telephone Encounter (Signed)
Pt c/o increased BP at [facility] of 181/96 and pt states she didn't think she was nervous. Pt also c/o increased wt gain in the last month and is concerned that the Losartan could be causing her to retain fluid? Pt asks your opinion. Thank you!

## 2021-10-11 NOTE — Progress Notes (Signed)
Received fax from [facility] that they were unable to reach pt for CPAP mask refit.

## 2021-10-14 ENCOUNTER — Other Ambulatory Visit: Payer: Self-pay | Admitting: Family Medicine

## 2021-10-15 NOTE — Telephone Encounter (Signed)
Requested Prescriptions  Pending Prescriptions Disp Refills  . metoprolol tartrate (LOPRESSOR) 25 MG tablet [Pharmacy Med Name: Metoprolol Tartrate Oral Tablet 25 MG] 90 tablet 0    Sig: TAKE ONE TABLET BY MOUTH ONE TIME DAILY     Cardiovascular:  Beta Blockers Failed - 10/14/2021  7:49 PM      Failed - Last BP in normal range    BP Readings from Last 1 Encounters:  09/21/21 (!) 176/97         Failed - Valid encounter within last 6 months    Recent Outpatient Visits          5 months ago General medical exam   [place] Susy Frizzle, MD   11 months ago Syncope, unspecified syncope type   [place] Pickard, Cammie Mcgee, MD   1 year ago Screening cholesterol level   [place] Pickard, Cammie Mcgee, MD   2 years ago General medical exam   [place] Susy Frizzle, MD   3 years ago Screening cholesterol level   [place] Pickard, Cammie Mcgee, MD      Future Appointments            In 6 months Pickard, Cammie Mcgee, MD [place]           Passed - Last Heart Rate in normal range    Pulse Readings from Last 1 Encounters:  09/21/21 77

## 2021-10-19 ENCOUNTER — Encounter: Payer: Self-pay | Admitting: Neurology

## 2021-10-19 ENCOUNTER — Ambulatory Visit (INDEPENDENT_AMBULATORY_CARE_PROVIDER_SITE_OTHER): Payer: Medicare Other | Admitting: Neurology

## 2021-10-19 ENCOUNTER — Ambulatory Visit (HOSPITAL_COMMUNITY): Payer: Medicare Other | Admitting: Physical Therapy

## 2021-10-19 VITALS — BP 145/83 | HR 75 | Ht 65.0 in | Wt 201.0 lb

## 2021-10-19 DIAGNOSIS — G5139 Clonic hemifacial spasm, unspecified: Secondary | ICD-10-CM | POA: Diagnosis not present

## 2021-10-19 DIAGNOSIS — G4733 Obstructive sleep apnea (adult) (pediatric): Secondary | ICD-10-CM | POA: Diagnosis not present

## 2021-10-19 NOTE — Progress Notes (Signed)
Chief Complaint  Patient presents with   Follow-up    Rm 15. Alone. NP internal from Dr Brett Fairy for botox for facial spasms.      ASSESSMENT AND PLAN  Lynn Estes is a 76 y.o. female   Blepharospasm  EMG guided xeomin injection, preauthorization for 50 units  Return to clinic in one month   DIAGNOSTIC DATA (LABS, IMAGING, TESTING) - I reviewed patient records, labs, notes, testing and imaging myself where available.  Normal TSH, vitamin D, CBC, CMP, LDL 110  MEDICAL HISTORY:  Lynn Estes is a 76 year old female, seen in request by Dr. Brett Fairy for evaluation of botulism toxin injections for blepharospasm, her primary care physician is Dr. Dennard Schaumann, [place] Mcge[place],    I reviewed and summarized the referring note. PMHX HTN OSA History of shingles involving right arm, right thoracic spine History of left ankle surgery  Since beginning of this year, she noticed gradual worsening uncontrollable bilateral eye blinking, to the point of affecting her driving, she had a cosmetic Botox injection by dermatologist few months ago with mild improvement  Around 10 years ago, she remembers she had a tendency to creasing her forehead, rolled up her eyes, lasting for few years gradually went away, over the years, she also noticed tendency for moving her mouth and nose, scratching her shoulders, clear her throat,  Mild tendency for obsessive-compulsive, need to organize object in certain ways  One of her son has moist tics, obsessive-compulsive tendency   PHYSICAL EXAM:   Vitals:   10/19/21 1608 10/19/21 1611  BP: (!) 136/108 (!) 145/83  Pulse: 78 75  Weight: 201 lb (91.2 kg)   Height: '5\' 5"'$  (1.651 m)    Not recorded     Body mass index is 33.45 kg/m.  PHYSICAL EXAMNIATION:  Gen: NAD, conversant, well nourised, well groomed                     Cardiovascular: Regular rate rhythm, no peripheral edema, warm, nontender. Eyes: Conjunctivae clear without exudates or  hemorrhage Neck: Supple, no carotid bruits. Pulmonary: Clear to auscultation bilaterally   NEUROLOGICAL EXAM:  MENTAL STATUS: Speech/cognition: Awake, alert, oriented to history taking and casual conversation, frequent eye blinking, occasionally eye loading, creasing of forehead, CRANIAL NERVES: CN II: Visual fields are full to confrontation. Pupils are round equal and briskly reactive to light. CN III, IV, VI: extraocular movement are normal. No ptosis. CN V: Facial sensation is intact to light touch CN VII: Face is symmetric with normal eye closure  CN VIII: Hearing is normal to causal conversation. CN IX, X: Phonation is normal. CN XI: Head turning and shoulder shrug are intact  MOTOR: There is no pronator drift of out-stretched arms. Muscle bulk and tone are normal. Muscle strength is normal.  Well-healed left medial ankle scar  REFLEXES: Reflexes are 2+ and symmetric at the biceps, triceps, knees, and ankles. Plantar responses are flexor.  SENSORY: Intact to light touch, pinprick and vibratory sensation are intact in fingers and toes.  COORDINATION: There is no trunk or limb dysmetria noted.  GAIT/STANCE: Need push-up to get up from seated position, steady,  REVIEW OF SYSTEMS:  Full 14 system review of systems performed and notable only for as above All other review of systems were negative.   ALLERGIES: No Known Allergies  HOME MEDICATIONS: Current Outpatient Medications  Medication Sig Dispense Refill   Calcium 500 MG tablet Take 600 mg by mouth 2 (two) times daily.  Cholecalciferol (VITAMIN D PO) Take 2,000 Units by mouth daily.     clobetasol (TEMOVATE) 0.05 % external solution Apply topically.     Cyanocobalamin (VITAMIN B 12 PO) Take by mouth.     diclofenac (VOLTAREN) 75 MG EC tablet TAKE ONE TABLET BY MOUTH TWICE DAILY  (Patient taking differently: daily as needed.) 30 tablet 0   fluticasone (CUTIVATE) 0.05 % cream Apply 1 application  topically as  needed.     losartan-hydrochlorothiazide (HYZAAR) 100-25 MG tablet Take 1 tablet by mouth daily. 90 tablet 3   metoprolol tartrate (LOPRESSOR) 25 MG tablet TAKE ONE TABLET BY MOUTH ONE TIME DAILY 90 tablet 0   omeprazole (PRILOSEC) 40 MG capsule Take 1 capsule (40 mg total) by mouth daily. 30 capsule 3   TURMERIC PO Take by mouth.     Zinc 50 MG TABS Take by mouth.     No current facility-administered medications for this visit.    PAST MEDICAL HISTORY: Past Medical History:  Diagnosis Date   Arthritis    generalized-bilateral hands   Asthma    hx of   Cataract    bilateral-LEFT eye   COPD (chronic obstructive pulmonary disease) ([place])    not being tx'd at this time (03/23/2020)   Diverticulitis    History of shingles    Hypertension    on meds   Lung nodules    Nodules noted on CT scan July 2017   OSA (obstructive sleep apnea)    Partial small bowel obstruction ([place])     PAST SURGICAL HISTORY: Past Surgical History:  Procedure Laterality Date   ANKLE SURGERY  2019   CATARACT EXTRACTION W/PHACO Left 06/08/2015   Procedure: CATARACT EXTRACTION PHACO AND INTRAOCULAR LENS PLACEMENT LEFT EYE CDE=7.18;  Surgeon: Williams Che, MD;  Location: [facility];  Service: Ophthalmology;  Laterality: Left;   DILATION AND CURETTAGE OF UTERUS     X 2   EXERCISE TOLERANCE TEST (GXT)  11/2016   Low Exercise Tolerance - 6:24 min (dyspnea, fatigue), upsloping ST segment depression noted.  Nondiagnostic.  HR 150 bpm = 97% MPHR). Hypertensive response to exercise. NEGATIVE test.  No arrhythmias.   right rotator cuff surgery     TRANSTHORACIC ECHOCARDIOGRAM  11/2016   Normal LV size and function.  GR 1 DD.  Moderate concentric hypertrophy.  Vigorous LV function with EF of 65-70%.   WISDOM TOOTH EXTRACTION      FAMILY HISTORY: Family History  Problem Relation Age of Onset   Hypertension Brother    Stroke Maternal Grandmother    Colon polyps Neg Hx    Colon cancer Neg Hx    Esophageal cancer  Neg Hx    Rectal cancer Neg Hx    Stomach cancer Neg Hx     SOCIAL HISTORY: Social History   Socioeconomic History   Marital status: Married    Spouse name: Nicole Kindred   Number of children: 2   Years of education: 13   Highest education level: Not on file  Occupational History   Occupation: Medical illustrator    Comment: Chief Executive Officer  Tobacco Use   Smoking status: Never   Smokeless tobacco: Never  Vaping Use   Vaping Use: Never used  Substance and Sexual Activity   Alcohol use: Yes    Alcohol/week: 14.0 standard drinks of alcohol    Types: 14 Standard drinks or equivalent per week   Drug use: No   Sexual activity: Not on file  Other Topics Concern  Not on file  Social History Narrative   PFT---08-20-2009   fev1-2.04, 92%, fev1/fvc 0.79   Widowed, remarried.  2 kids   Works part time Naval architect       Lives with husband   Right handed   Caffeine: 2 cups of coffee in the AM   Social Determinants of Health   Financial Resource Strain: Not on file  Food Insecurity: Not on file  Transportation Needs: Not on file  Physical Activity: Sufficiently Active (11/24/2016)   Exercise Vital Sign    Days of Exercise per Week: 5 days    Minutes of Exercise per Session: 40 min  Stress: Not on file  Social Connections: Not on file  Intimate Partner Violence: Not on file      Marcial Pacas, M.D. Ph.D.  [facility] [address] [place], [place] [address] Ph: [phone] Fax: [phone]  CC:  Susy Frizzle, MD [address] [place],   [address]  Susy Frizzle, MD

## 2021-11-05 ENCOUNTER — Ambulatory Visit (HOSPITAL_COMMUNITY): Payer: Medicare Other | Attending: Family Medicine

## 2021-11-05 DIAGNOSIS — M545 Low back pain, unspecified: Secondary | ICD-10-CM | POA: Diagnosis not present

## 2021-11-05 DIAGNOSIS — M5386 Other specified dorsopathies, lumbar region: Secondary | ICD-10-CM | POA: Insufficient documentation

## 2021-11-05 DIAGNOSIS — M5431 Sciatica, right side: Secondary | ICD-10-CM | POA: Insufficient documentation

## 2021-11-05 NOTE — Therapy (Addendum)
OUTPATIENT PHYSICAL THERAPY THORACOLUMBAR EVALUATION   Patient Name: Lynn Estes MRN: [id number] DOB:[birth year], 76 y.o., female Today's Date: 11/05/2021   PT End of Session - 11/05/21 1207     Visit Number 1    Number of Visits 8    Date for PT Re-Evaluation 12/03/21    Authorization Type Medicare and [employer] supplement    Progress Note Due on Visit 10    PT Start Time 1200    PT Stop Time 1240    PT Time Calculation (min) 40 min    Activity Tolerance Patient tolerated treatment well             Past Medical History:  Diagnosis Date   Arthritis    generalized-bilateral hands   Asthma    hx of   Cataract    bilateral-LEFT eye   COPD (chronic obstructive pulmonary disease) ([place])    not being tx'd at this time (03/23/2020)   Diverticulitis    History of shingles    Hypertension    on meds   Lung nodules    Nodules noted on CT scan July 2017   OSA (obstructive sleep apnea)    Partial small bowel obstruction [facility])    Past Surgical History:  Procedure Laterality Date   ANKLE SURGERY  2019   CATARACT EXTRACTION W/PHACO Left 06/08/2015   Procedure: CATARACT EXTRACTION PHACO AND INTRAOCULAR LENS PLACEMENT LEFT EYE CDE=7.18;  Surgeon: Williams Che, MD;  Location: [facility];  Service: Ophthalmology;  Laterality: Left;   DILATION AND CURETTAGE OF UTERUS     X 2   EXERCISE TOLERANCE TEST (GXT)  11/2016   Low Exercise Tolerance - 6:24 min (dyspnea, fatigue), upsloping ST segment depression noted.  Nondiagnostic.  HR 150 bpm = 97% MPHR). Hypertensive response to exercise. NEGATIVE test.  No arrhythmias.   right rotator cuff surgery     TRANSTHORACIC ECHOCARDIOGRAM  11/2016   Normal LV size and function.  GR 1 DD.  Moderate concentric hypertrophy.  Vigorous LV function with EF of 65-70%.   WISDOM TOOTH EXTRACTION     Patient Active Problem List   Diagnosis Date Noted   Facial spasm 09/02/2020   OSA on CPAP 05/20/2018   Loud snoring 02/15/2018   Hypoxemia during  surgery 02/15/2018   Nasal obstruction 02/15/2018   Vasovagal syncope 11/17/2016   Rapid palpitations 11/17/2016   Abnormal EKG 11/17/2016   Chest pain with low risk for cardiac etiology 11/17/2016   Asthma, mild intermittent, well-controlled 02/02/2014   Cough due to ACE inhibitor 02/02/2012   Chronic insomnia 05/13/2011   Throat tightness 04/15/2011   Seasonal allergic rhinitis 03/02/2010   Essential hypertension 08/18/2009   Lung nodules 08/13/2009    PCP: Jenna Luo, MD  REFERRING PROVIDER: Jenna Luo, MD  REFERRING DIAG:  Diagnosis  M54.31 (ICD-10-CM) - Right sided sciatica    Rationale for Evaluation and Treatment: Rehabilitation  THERAPY DIAG:  Low back pain, unspecified back pain laterality, unspecified chronicity, unspecified whether sciatica present - Plan: PT plan of care cert/re-cert  Sciatica of right side associated with disorder of lumbar spine - Plan: PT plan of care cert/re-cert  ONSET DATE: chronic back pain; worse a couple months ago  SUBJECTIVE:  SUBJECTIVE STATEMENT: Chronic back pain for years but recently got worse; pain starts in the leg and moves up to the back Painful especially with driving and getting in and out the car  PERTINENT HISTORY:  Shattered left ankle 3-4 years; s/p Dr. Doran Durand Right shoulder rotator cuff repair 12 years ago per Dr. French Ana  PAIN:  Are you having pain? Yes: NPRS scale: 4-5/10 Pain location: low back and down right leg Pain description: aching Aggravating factors: when I raise my right leg; standing still too long Relieving factors: rest  PRECAUTIONS: None  WEIGHT BEARING RESTRICTIONS: No  FALLS:  Has patient fallen in last 6 months? Yes. Number of falls 1  OCCUPATION: retired  PLOF: Independent  PATIENT GOALS: be  able to walk better   OBJECTIVE:   DIAGNOSTIC FINDINGS:  CLINICAL DATA:  Right-sided sciatica post fall   EXAM: LUMBAR SPINE - COMPLETE 4+ VIEW   COMPARISON:  None Available.   FINDINGS: Five lumbar type vertebra. Lumbar alignment within normal limits. Vertebral body heights are maintained. Mild disc space narrowing L2-L3 and L3-L4 with multilevel degenerative osteophyte. Facet degenerative changes of the lower lumbar spine.   IMPRESSION: Mild multilevel degenerative change.  No acute osseous abnormality     Electronically Signed   By: Donavan Foil M.D.   On: 09/15/2021 18:14    PATIENT SURVEYS:  FOTO 56   COGNITION: Overall cognitive status: Within functional limits for tasks assessed     SENSATION: WFL  POSTURE:  antalgic gait; decreased stance right lower extremity  PALPATION:   LUMBAR ROM:   AROM eval  Flexion 65% available  Extension 30% available  Right lateral flexion   Left lateral flexion   Right rotation   Left rotation    (Blank rows = not tested)   LOWER EXTREMITY MMT:    MMT Right eval Left eval  Hip flexion 3- 4+  Hip extension 3- 4-  Hip abduction    Hip adduction    Hip internal rotation    Hip external rotation    Knee flexion 4+ 4+  Knee extension 4+ 5  Ankle dorsiflexion 5 5            Ankle plantarflexion    Ankle inversion    Ankle eversion     (Blank rows = not tested)   FUNCTIONAL TESTS:  5 times sit to stand: 12.84 sec  GAIT: Distance walked: 100 Assistive device utilized: None Level of assistance: SBA Comments: decreased stance Right lower extremity  TODAY'S TREATMENT:   physical therapy evaluation and HEP instruction                                                                                                                           DATE: 11/05/21    PATIENT EDUCATION:  Education details: Patient educated on exam findings, POC, scope of PT, HEP. Person educated: Patient Education method:  Explanation, Demonstration, and Handouts Education comprehension: verbalized understanding, returned demonstration, verbal cues  required, and tactile cues required  HOME EXERCISE PROGRAM: Access Code: [email or web address] URL: [email or web address] Date: 11/05/2021 Prepared by: [facility]  Exercises - Supine Transversus Abdominis Bracing - Hands on Stomach  - 2 x daily - 7 x weekly - 1 sets - 10 reps - Supine Bridge  - 2 x daily - 7 x weekly - 1 sets - 10 reps - Sitting to Supine Roll  - 2 x daily - 7 x weekly - 1 sets - 10 reps - Lying Prone  - 2 x daily - 7 x weekly - 1 sets - 1 reps - 5 min hold  ASSESSMENT:  CLINICAL IMPRESSION: Patient is a 76 y.o. female who was seen today for physical therapy evaluation and treatment for  Diagnosis  M54.31 (ICD-10-CM) - Right sided sciatica  Patient presents on evaluation with limited lumbar mobility, decreased lower extremity strength and pain which negatively impacts her ability to work in the yard, walk for fitness and cook in the kitchen.  Patient will benefit from continued skilled therapy services to address deficits and promote return to optimal function.      OBJECTIVE IMPAIRMENTS: Abnormal gait, decreased activity tolerance, decreased mobility, difficulty walking, decreased ROM, decreased strength, hypomobility, increased fascial restrictions, impaired perceived functional ability, increased muscle spasms, impaired flexibility, and pain.   ACTIVITY LIMITATIONS: carrying, lifting, bending, standing, squatting, sleeping, stairs, locomotion level, and caring for others  PARTICIPATION LIMITATIONS: meal prep, cleaning, laundry, community activity, and yard work  [employer] POTENTIAL: Good  CLINICAL DECISION MAKING: Stable/uncomplicated  EVALUATION COMPLEXITY: Low   GOALS: Goals reviewed with patient? No  SHORT TERM GOALS: Target date: 11/19/2021  Patient will be independent with initial HEP  Baseline: Goal status: INITIAL  2.    Patient will report at least 50% improvement in overall symptoms and/or function to demonstrate improved functional mobility Baseline:  Goal status: INITIAL    LONG TERM GOALS: Target date: 12/03/2021  Patient will be independent in self management strategies to improve quality of life and functional outcomes.  Baseline:  Goal status: INITIAL  2.   Patient will report at least 50% improvement in overall symptoms and/or function to demonstrate improved functional mobility   Baseline:  Goal status: INITIAL  3.  Patient will increase right leg MMTs to 5/5 without pain to promote return to ambulation community distances with minimal deviation.  Baseline:  Goal status: INITIAL  4.  Patient will improve 5 times sit to stand score from 12.84 sec sec to 10 sec to demonstrate improved functional mobility and increased lower extremity strength.   Baseline: 12.84 sec Goal status: INITIAL  5.  Patient will ambulate x 20 minutes without pain for fitness purposes Baseline:  Goal status: INITIAL PLAN:  PT FREQUENCY: 2x/week  PT DURATION: 4 weeks  PLANNED INTERVENTIONS: Therapeutic exercises, Therapeutic activity, Neuromuscular re-education, Balance training, Gait training, Patient/Family education, Joint manipulation, Joint mobilization, Stair training, Orthotic/Fit training, DME instructions, Aquatic Therapy, Dry Needling, Electrical stimulation, Spinal manipulation, Spinal mobilization, Cryotherapy, Moist heat, Compression bandaging, scar mobilization, Splintting, Taping, Traction, Ultrasound, Ionotophoresis '4mg'$ /ml Dexamethasone, and Manual therapy   PLAN FOR NEXT SESSION: Review HEP and goals         12:49 PM, 11/05/21 Dawon Troop [employer] MPT [facility] [place] [phone] VP:[phone]

## 2021-11-09 ENCOUNTER — Telehealth: Payer: Self-pay

## 2021-11-09 ENCOUNTER — Encounter (HOSPITAL_COMMUNITY): Payer: Self-pay

## 2021-11-09 ENCOUNTER — Ambulatory Visit (HOSPITAL_COMMUNITY): Payer: Medicare Other

## 2021-11-09 DIAGNOSIS — M5386 Other specified dorsopathies, lumbar region: Secondary | ICD-10-CM

## 2021-11-09 DIAGNOSIS — M545 Low back pain, unspecified: Secondary | ICD-10-CM

## 2021-11-09 NOTE — Telephone Encounter (Signed)
Please submit new PA for Xeomin [id number] 50Units  [id number]/EMG guidance.    Chemical Denervation of neck muscles: CPT 64612 G24.5 Blepharospasm

## 2021-11-09 NOTE — Therapy (Signed)
OUTPATIENT PHYSICAL THERAPY THORACOLUMBAR TREATMENT   Patient Name: Lynn Estes MRN: [id number] DOB:10/07/1945, 76 y.o., female Today's Date: 11/09/2021   PT End of Session - 11/09/21 0902     Visit Number 2    Number of Visits 8    Date for PT Re-Evaluation 12/03/21    Authorization Type Medicare and Aetna supplement    Progress Note Due on Visit 10    PT Start Time 0817    PT Stop Time 0858    PT Time Calculation (min) 41 min    Behavior During Therapy [facility] for tasks assessed/performed              Past Medical History:  Diagnosis Date   Arthritis    generalized-bilateral hands   Asthma    hx of   Cataract    bilateral-LEFT eye   COPD (chronic obstructive pulmonary disease) ([place])    not being tx'd at this time (03/23/2020)   Diverticulitis    History of shingles    Hypertension    on meds   Lung nodules    Nodules noted on CT scan July 2017   OSA (obstructive sleep apnea)    Partial small bowel obstruction [facility])    Past Surgical History:  Procedure Laterality Date   ANKLE SURGERY  2019   CATARACT EXTRACTION W/PHACO Left 06/08/2015   Procedure: CATARACT EXTRACTION PHACO AND INTRAOCULAR LENS PLACEMENT LEFT EYE CDE=7.18;  Surgeon: Williams Che, MD;  Location: [facility];  Service: Ophthalmology;  Laterality: Left;   DILATION AND CURETTAGE OF UTERUS     X 2   EXERCISE TOLERANCE TEST (GXT)  11/2016   Low Exercise Tolerance - 6:24 min (dyspnea, fatigue), upsloping ST segment depression noted.  Nondiagnostic.  HR 150 bpm = 97% MPHR). Hypertensive response to exercise. NEGATIVE test.  No arrhythmias.   right rotator cuff surgery     TRANSTHORACIC ECHOCARDIOGRAM  11/2016   Normal LV size and function.  GR 1 DD.  Moderate concentric hypertrophy.  Vigorous LV function with EF of 65-70%.   WISDOM TOOTH EXTRACTION     Patient Active Problem List   Diagnosis Date Noted   Facial spasm 09/02/2020   OSA on CPAP 05/20/2018   Loud snoring 02/15/2018   Hypoxemia  during surgery 02/15/2018   Nasal obstruction 02/15/2018   Vasovagal syncope 11/17/2016   Rapid palpitations 11/17/2016   Abnormal EKG 11/17/2016   Chest pain with low risk for cardiac etiology 11/17/2016   Asthma, mild intermittent, well-controlled 02/02/2014   Cough due to ACE inhibitor 02/02/2012   Chronic insomnia 05/13/2011   Throat tightness 04/15/2011   Seasonal allergic rhinitis 03/02/2010   Essential hypertension 08/18/2009   Lung nodules 08/13/2009    PCP: Jenna Luo, MD  REFERRING PROVIDER: Jenna Luo, MD  REFERRING DIAG:  Diagnosis  M54.31 (ICD-10-CM) - Right sided sciatica    Rationale for Evaluation and Treatment: Rehabilitation  THERAPY DIAG:  Low back pain, unspecified back pain laterality, unspecified chronicity, unspecified whether sciatica present  Sciatica of right side associated with disorder of lumbar spine  ONSET DATE: chronic back pain; worse a couple months ago  SUBJECTIVE:  SUBJECTIVE STATEMENT: 11/09/21:  Reports she has began walking every day, began HEP and practicing bed mobility.  No reports of pain currently.  Eval subjective: Chronic back pain for years but recently got worse; pain starts in the leg and moves up to the back Painful especially with driving and getting in and out the car  PERTINENT HISTORY:  Shattered left ankle 3-4 years; s/p Dr. Doran Durand Right shoulder rotator cuff repair 12 years ago per Dr. French Ana  PAIN:  Are you having pain? Yes: NPRS scale: 4-5/10 Pain location: low back and down right leg Pain description: aching Aggravating factors: when I raise my right leg; standing still too long Relieving factors: rest  PRECAUTIONS: None  WEIGHT BEARING RESTRICTIONS: No  FALLS:  Has patient fallen in last 6 months? Yes. Number  of falls 1  OCCUPATION: retired  PLOF: Independent  PATIENT GOALS: be able to walk better   OBJECTIVE:   DIAGNOSTIC FINDINGS:  CLINICAL DATA:  Right-sided sciatica post fall   EXAM: LUMBAR SPINE - COMPLETE 4+ VIEW   COMPARISON:  None Available.   FINDINGS: Five lumbar type vertebra. Lumbar alignment within normal limits. Vertebral body heights are maintained. Mild disc space narrowing L2-L3 and L3-L4 with multilevel degenerative osteophyte. Facet degenerative changes of the lower lumbar spine.   IMPRESSION: Mild multilevel degenerative change.  No acute osseous abnormality     Electronically Signed   By: Donavan Foil M.D.   On: 09/15/2021 18:14    PATIENT SURVEYS:  FOTO 56   COGNITION: Overall cognitive status: Within functional limits for tasks assessed     SENSATION: WFL  POSTURE:  antalgic gait; decreased stance right lower extremity  PALPATION:   LUMBAR ROM:   AROM eval  Flexion 65% available  Extension 30% available  Right lateral flexion   Left lateral flexion   Right rotation   Left rotation    (Blank rows = not tested)   LOWER EXTREMITY MMT:    MMT Right eval Left eval  Hip flexion 3- 4+  Hip extension 3- 4-  Hip abduction    Hip adduction    Hip internal rotation    Hip external rotation    Knee flexion 4+ 4+  Knee extension 4+ 5  Ankle dorsiflexion 5 5            Ankle plantarflexion    Ankle inversion    Ankle eversion     (Blank rows = not tested)   FUNCTIONAL TESTS:  5 times sit to stand: 12.84 sec  GAIT: Distance walked: 100 Assistive device utilized: None Level of assistance: SBA Comments: decreased stance Right lower extremity  TODAY'S TREATMENT:                                                                                                                          DATE:  11/09/21:  Reviewed goals and educated importance of HEP compliance for maximal benefits  Reviewed log rolling for bed mobility  Supine:  bridge 10x 5" cueing for breathing during all exercises.   Diaphragmatic breathing x 3'    Abdominal sets '1\' 3"'$  holds   LTR 5x 10"   March with ab set 5x 5" holds  Prone: POE x 1'   Heel squeeze 5x 5"     11/05/21: physical therapy evaluation and HEP instruction       PATIENT EDUCATION:  Education details: Patient educated on exam findings, POC, scope of PT, HEP. Person educated: Patient Education method: Explanation, Demonstration, and Handouts Education comprehension: verbalized understanding, returned demonstration, verbal cues required, and tactile cues required  HOME EXERCISE PROGRAM: Access Code: [email or web address] URL: [email or web address] Date: 11/05/2021 Prepared by: [facility]  Exercises - Supine Transversus Abdominis Bracing - Hands on Stomach  - 2 x daily - 7 x weekly - 1 sets - 10 reps - Supine Bridge  - 2 x daily - 7 x weekly - 1 sets - 10 reps - Sitting to Supine Roll  - 2 x daily - 7 x weekly - 1 sets - 10 reps - Lying Prone  - 2 x daily - 7 x weekly - 1 sets - 1 reps - 5 min hold  ASSESSMENT:  CLINICAL IMPRESSION: Reviewed goals, educated importance of HEP compliance which pt able to recall with min cueing to improve mechanics.  Pt tendency to hold breath during exercise, educated importance of breathing with all exercises and added diaphragmatic breathing to pair with abdominal sets.  Session focus with lumbar mobility and core/proximal strengthening.  Pt able to complete all exercises with reports she felt good at EOS.  Added LTR and POE to HEP for mobility, will benefit from additional core strengthening exercises in future.    OBJECTIVE IMPAIRMENTS: Abnormal gait, decreased activity tolerance, decreased mobility, difficulty walking, decreased ROM, decreased strength, hypomobility, increased fascial restrictions, impaired perceived functional ability, increased muscle spasms, impaired flexibility, and pain.   ACTIVITY LIMITATIONS: carrying, lifting,  bending, standing, squatting, sleeping, stairs, locomotion level, and caring for others  PARTICIPATION LIMITATIONS: meal prep, cleaning, laundry, community activity, and yard work  [employer] POTENTIAL: Good  CLINICAL DECISION MAKING: Stable/uncomplicated  EVALUATION COMPLEXITY: Low   GOALS: Goals reviewed with patient? No  SHORT TERM GOALS: Target date: 11/19/2021  Patient will be independent with initial HEP  Baseline: Goal status: IN PROGRESS  2.   Patient will report at least 50% improvement in overall symptoms and/or function to demonstrate improved functional mobility Baseline:  Goal status: IN PROGRESS    LONG TERM GOALS: Target date: 12/03/2021  Patient will be independent in self management strategies to improve quality of life and functional outcomes.  Baseline:  Goal status: IN PROGRESS  2.   Patient will report at least 50% improvement in overall symptoms and/or function to demonstrate improved functional mobility   Baseline:  Goal status: IN PROGRESS  3.  Patient will increase right leg MMTs to 5/5 without pain to promote return to ambulation community distances with minimal deviation.  Baseline:  Goal status: IN PROGRESS  4.  Patient will improve 5 times sit to stand score from 12.84 sec sec to 10 sec to demonstrate improved functional mobility and increased lower extremity strength.   Baseline: 12.84 sec Goal status: IN PROGRESS  5.  Patient will ambulate x 20 minutes without pain for fitness purposes Baseline:  Goal status: IN PROGRESS PLAN:  PT FREQUENCY: 2x/week  PT DURATION: 4 weeks  PLANNED INTERVENTIONS: Therapeutic exercises, Therapeutic activity, Neuromuscular re-education, Balance training, Gait training, Patient/Family  education, Joint manipulation, Joint mobilization, Stair training, Orthotic/Fit training, DME instructions, Aquatic Therapy, Dry Needling, Electrical stimulation, Spinal manipulation, Spinal mobilization, Cryotherapy, Moist  heat, Compression bandaging, scar mobilization, Splintting, Taping, Traction, Ultrasound, Ionotophoresis '4mg'$ /ml Dexamethasone, and Manual therapy   PLAN FOR NEXT SESSION: Progress core and proximal strengthening and lumbar mobility    Ihor Austin, LPTA/CLT; CBIS [phone]  11:55 AM, 11/09/21

## 2021-11-10 ENCOUNTER — Other Ambulatory Visit (HOSPITAL_COMMUNITY): Payer: Self-pay

## 2021-11-10 NOTE — Telephone Encounter (Signed)
Patient Advocate Encounter   Received notification that prior authorization for Xeomin 50UNIT solution is required.   PA submitted on 11/10/2021 Key [id number] Status is pending       Lyndel Safe, [place] Patient Advocate Specialist [place] Patient Advocate Team Direct Number: [phone]  Fax: [phone]

## 2021-11-11 ENCOUNTER — Ambulatory Visit (HOSPITAL_COMMUNITY): Payer: Medicare Other

## 2021-11-11 ENCOUNTER — Encounter (HOSPITAL_COMMUNITY): Payer: Self-pay

## 2021-11-11 DIAGNOSIS — M545 Low back pain, unspecified: Secondary | ICD-10-CM | POA: Diagnosis not present

## 2021-11-11 DIAGNOSIS — M5386 Other specified dorsopathies, lumbar region: Secondary | ICD-10-CM

## 2021-11-11 NOTE — Therapy (Addendum)
OUTPATIENT PHYSICAL THERAPY THORACOLUMBAR TREATMENT   Patient Name: Lynn Estes MRN: [id number] DOB:October 27, 1945, 76 y.o., female Today's Date: 11/11/2021    11/11/21 1021  PT Visits / Re-Eval  Visit Number 3  Number of Visits 8  Date for PT Re-Evaluation 12/03/21  Authorization  Authorization Type Medicare and Aetna supplement  Progress Note Due on Visit 10  PT Time Calculation  PT Start Time [phone]  PT Stop Time 1028  PT Time Calculation (min) 39 min  PT - End of Session  Activity Tolerance Patient tolerated treatment well  Behavior During Therapy WFL for tasks assessed/performed      Past Medical History:  Diagnosis Date   Arthritis    generalized-bilateral hands   Asthma    hx of   Cataract    bilateral-LEFT eye   COPD (chronic obstructive pulmonary disease) ([place])    not being tx'd at this time (03/23/2020)   Diverticulitis    History of shingles    Hypertension    on meds   Lung nodules    Nodules noted on CT scan July 2017   OSA (obstructive sleep apnea)    Partial small bowel obstruction ([place])    Past Surgical History:  Procedure Laterality Date   ANKLE SURGERY  2019   CATARACT EXTRACTION W/PHACO Left 06/08/2015   Procedure: CATARACT EXTRACTION PHACO AND INTRAOCULAR LENS PLACEMENT LEFT EYE CDE=7.18;  Surgeon: Williams Che, MD;  Location: [facility];  Service: Ophthalmology;  Laterality: Left;   DILATION AND CURETTAGE OF UTERUS     X 2   EXERCISE TOLERANCE TEST (GXT)  11/2016   Low Exercise Tolerance - 6:24 min (dyspnea, fatigue), upsloping ST segment depression noted.  Nondiagnostic.  HR 150 bpm = 97% MPHR). Hypertensive response to exercise. NEGATIVE test.  No arrhythmias.   right rotator cuff surgery     TRANSTHORACIC ECHOCARDIOGRAM  11/2016   Normal LV size and function.  GR 1 DD.  Moderate concentric hypertrophy.  Vigorous LV function with EF of 65-70%.   WISDOM TOOTH EXTRACTION     Patient Active Problem List   Diagnosis Date Noted   Facial  spasm 09/02/2020   OSA on CPAP 05/20/2018   Loud snoring 02/15/2018   Hypoxemia during surgery 02/15/2018   Nasal obstruction 02/15/2018   Vasovagal syncope 11/17/2016   Rapid palpitations 11/17/2016   Abnormal EKG 11/17/2016   Chest pain with low risk for cardiac etiology 11/17/2016   Asthma, mild intermittent, well-controlled 02/02/2014   Cough due to ACE inhibitor 02/02/2012   Chronic insomnia 05/13/2011   Throat tightness 04/15/2011   Seasonal allergic rhinitis 03/02/2010   Essential hypertension 08/18/2009   Lung nodules 08/13/2009    PCP: Jenna Luo, MD  REFERRING PROVIDER: Jenna Luo, MD  REFERRING DIAG:  Diagnosis  M54.31 (ICD-10-CM) - Right sided sciatica    Rationale for Evaluation and Treatment: Rehabilitation  THERAPY DIAG:  No diagnosis found.  ONSET DATE: chronic back pain; worse a couple months ago  SUBJECTIVE:  SUBJECTIVE STATEMENT: 11/11/21:  Pt stated she is feeling good today, reports a little twing when lifting Rt LE earlier today.  Reports she went for a walk for 20 minutes yesterday.  Stated she has been working on breathing and improving bed mobility at home.  No reports of pain today.    Eval subjective: Chronic back pain for years but recently got worse; pain starts in the leg and moves up to the back Painful especially with driving and getting in and out the car  PERTINENT HISTORY:  Shattered left ankle 3-4 years; s/p Dr. Doran Durand Right shoulder rotator cuff repair 12 years ago per Dr. French Ana  PAIN:  Are you having pain? Yes: NPRS scale: 0/10 Pain location: low back and down right leg Pain description: aching Aggravating factors: when I raise my right leg; standing still too long Relieving factors: rest  PRECAUTIONS: None  WEIGHT BEARING  RESTRICTIONS: No  FALLS:  Has patient fallen in last 6 months? Yes. Number of falls 1  OCCUPATION: retired  PLOF: Independent  PATIENT GOALS: be able to walk better   OBJECTIVE:   DIAGNOSTIC FINDINGS:  CLINICAL DATA:  Right-sided sciatica post fall   EXAM: LUMBAR SPINE - COMPLETE 4+ VIEW   COMPARISON:  None Available.   FINDINGS: Five lumbar type vertebra. Lumbar alignment within normal limits. Vertebral body heights are maintained. Mild disc space narrowing L2-L3 and L3-L4 with multilevel degenerative osteophyte. Facet degenerative changes of the lower lumbar spine.   IMPRESSION: Mild multilevel degenerative change.  No acute osseous abnormality     Electronically Signed   By: Donavan Foil M.D.   On: 09/15/2021 18:14    PATIENT SURVEYS:  FOTO 56   COGNITION: Overall cognitive status: Within functional limits for tasks assessed     SENSATION: WFL  POSTURE:  antalgic gait; decreased stance right lower extremity  PALPATION:   LUMBAR ROM:   AROM eval  Flexion 65% available  Extension 30% available  Right lateral flexion   Left lateral flexion   Right rotation   Left rotation    (Blank rows = not tested)   LOWER EXTREMITY MMT:    MMT Right eval Left eval  Hip flexion 3- 4+  Hip extension 3- 4-  Hip abduction    Hip adduction    Hip internal rotation    Hip external rotation    Knee flexion 4+ 4+  Knee extension 4+ 5  Ankle dorsiflexion 5 5            Ankle plantarflexion    Ankle inversion    Ankle eversion     (Blank rows = not tested)   FUNCTIONAL TESTS:  5 times sit to stand: 12.84 sec  GAIT: Distance walked: 100 Assistive device utilized: None Level of assistance: SBA Comments: decreased stance Right lower extremity  TODAY'S TREATMENT:  DATE:  11/11/21: STS 10x eccentric control no HHA  Sidelying: clam  10x 5" Supine: Bridge with GTB around thigh SLR with ab set 10x  11/09/21:  Reviewed goals and educated importance of HEP compliance for maximal benefits  Reviewed log rolling for bed mobility  Supine: bridge 10x 5" cueing for breathing during all exercises.   Diaphragmatic breathing x 3'    Abdominal sets '1\' 3"'$  holds   LTR 5x 10"   March with ab set 5x 5" holds  Prone: POE x 1'   Heel squeeze 5x 5"     11/05/21: physical therapy evaluation and HEP instruction       PATIENT EDUCATION:  Education details: Patient educated on exam findings, POC, scope of PT, HEP. Person educated: Patient Education method: Explanation, Demonstration, and Handouts Education comprehension: verbalized understanding, returned demonstration, verbal cues required, and tactile cues required  HOME EXERCISE PROGRAM: Access Code: [email or web address] URL: [email or web address] Date: 11/05/2021 Prepared by: [facility] - [facility]  Exercises  11/09/21: LTR and POE  - Supine Transversus Abdominis Bracing - Hands on Stomach  - 2 x daily - 7 x weekly - 1 sets - 10 reps - Supine Bridge  - 2 x daily - 7 x weekly - 1 sets - 10 reps - Sitting to Supine Roll  - 2 x daily - 7 x weekly - 1 sets - 10 reps - Lying Prone  - 2 x daily - 7 x weekly - 1 sets - 1 reps - 5 min hold  ASSESSMENT:  CLINICAL IMPRESSION: Progressed core and proximal strengthening with positive results.  Cueing for controlled movements and reviewed importance of breathing with all exercises.  Added clam and STS to HEP for gluteal strengthening.  Pt able to complete all exercises with no reports of pain.  OBJECTIVE IMPAIRMENTS: Abnormal gait, decreased activity tolerance, decreased mobility, difficulty walking, decreased ROM, decreased strength, hypomobility, increased fascial restrictions, impaired perceived functional ability, increased muscle spasms, impaired flexibility, and pain.   ACTIVITY LIMITATIONS: carrying, lifting, bending, standing,  squatting, sleeping, stairs, locomotion level, and caring for others  PARTICIPATION LIMITATIONS: meal prep, cleaning, laundry, community activity, and yard work  [employer] POTENTIAL: Good  CLINICAL DECISION MAKING: Stable/uncomplicated  EVALUATION COMPLEXITY: Low   GOALS: Goals reviewed with patient? No  SHORT TERM GOALS: Target date: 11/19/2021  Patient will be independent with initial HEP  Baseline: Goal status: IN PROGRESS  2.   Patient will report at least 50% improvement in overall symptoms and/or function to demonstrate improved functional mobility Baseline:  Goal status: IN PROGRESS    LONG TERM GOALS: Target date: 12/03/2021  Patient will be independent in self management strategies to improve quality of life and functional outcomes.  Baseline:  Goal status: IN PROGRESS  2.   Patient will report at least 50% improvement in overall symptoms and/or function to demonstrate improved functional mobility   Baseline:  Goal status: IN PROGRESS  3.  Patient will increase right leg MMTs to 5/5 without pain to promote return to ambulation community distances with minimal deviation.  Baseline:  Goal status: IN PROGRESS  4.  Patient will improve 5 times sit to stand score from 12.84 sec sec to 10 sec to demonstrate improved functional mobility and increased lower extremity strength.   Baseline: 12.84 sec Goal status: IN PROGRESS  5.  Patient will ambulate x 20 minutes without pain for fitness purposes Baseline:  Goal status: IN PROGRESS PLAN:  PT FREQUENCY: 2x/week  PT DURATION: 4 weeks  PLANNED INTERVENTIONS:  Therapeutic exercises, Therapeutic activity, Neuromuscular re-education, Balance training, Gait training, Patient/Family education, Joint manipulation, Joint mobilization, Stair training, Orthotic/Fit training, DME instructions, Aquatic Therapy, Dry Needling, Electrical stimulation, Spinal manipulation, Spinal mobilization, Cryotherapy, Moist heat, Compression  bandaging, scar mobilization, Splintting, Taping, Traction, Ultrasound, Ionotophoresis '4mg'$ /ml Dexamethasone, and Manual therapy   PLAN FOR NEXT SESSION: Progress core and proximal strengthening and lumbar mobility.  Progress to standing functional strengthening and balance training.  Ihor Austin, LPTA/CLT; CBIS [phone]  9:48 AM, 11/11/21

## 2021-11-12 ENCOUNTER — Other Ambulatory Visit (HOSPITAL_COMMUNITY): Payer: Self-pay

## 2021-11-12 NOTE — Telephone Encounter (Signed)
Patient Advocate Encounter  Prior Authorization for Xeomin 50UNIT solution has been approved.    PA# [id number] Effective dates: 11/12/2021 through 01/03/2023  [employer] [place] Sin, [place] Patient Advocate Specialist [place] Patient Advocate Team Direct Number: [phone]  Fax: [phone]

## 2021-11-15 MED ORDER — INCOBOTULINUMTOXINA 50 UNITS IM SOLR: 50.0000 [IU] | Freq: Once | INTRAMUSCULAR | 0 refills | Status: AC

## 2021-11-15 NOTE — Telephone Encounter (Signed)
Per note 10/17, EMG guided xeomin injection, preauthorization for 50 units. Return to clinic in one month. Pt is already scheduled for tomorrow. Contacted pt to inform her Xeomin was approved, no answer.

## 2021-11-15 NOTE — Addendum Note (Signed)
Addended by: Gertie Baron D on: 11/15/2021 08:56 AM   Modules accepted: Orders

## 2021-11-15 NOTE — Telephone Encounter (Signed)
Dr Krista Blue, this pt has a appt wit you tomorrow. Do you want me to schedule separate appt for Xeomin inject. Or do it at tomorrows appt ?

## 2021-11-16 ENCOUNTER — Encounter (HOSPITAL_COMMUNITY): Payer: 59

## 2021-11-16 ENCOUNTER — Ambulatory Visit (INDEPENDENT_AMBULATORY_CARE_PROVIDER_SITE_OTHER): Payer: Medicare Other | Admitting: Neurology

## 2021-11-16 ENCOUNTER — Encounter: Payer: Self-pay | Admitting: Neurology

## 2021-11-16 VITALS — BP 136/82 | Ht 65.0 in | Wt 202.0 lb

## 2021-11-16 DIAGNOSIS — H02045 Spastic entropion of left lower eyelid: Secondary | ICD-10-CM

## 2021-11-16 DIAGNOSIS — G5139 Clonic hemifacial spasm, unspecified: Secondary | ICD-10-CM | POA: Diagnosis not present

## 2021-11-16 MED ORDER — INCOBOTULINUMTOXINA 50 UNITS IM SOLR: 50.0000 [IU] | INTRAMUSCULAR | Status: DC

## 2021-11-16 MED ORDER — INCOBOTULINUMTOXINA 50 UNITS IM SOLR
50.0000 [IU] | INTRAMUSCULAR | Status: DC
  Administered 2021-11-16: 50 [IU] via INTRAMUSCULAR

## 2021-11-16 NOTE — Progress Notes (Signed)
Chief Complaint  Patient presents with   Follow-up    Rm 15 alone       ASSESSMENT AND PLAN  Lynn Estes is a 76 y.o. female   Blepharospasm  EMG guided xeomin injection use 50 units, first injection through our office November 16, 2021  Orbicularis oculi at the position of 2, 3, 4, 5, 6, 7, 8:00 (2.5 units x7=17.5 units) at left and right side  Right  corrugate 5 units Left corrugate 5 units Procerus  5 units   DIAGNOSTIC DATA (LABS, IMAGING, TESTING) - I reviewed patient records, labs, notes, testing and imaging myself where available.  Normal TSH, vitamin D, CBC, CMP, LDL 110  MEDICAL HISTORY:  Lynn Estes is a 76 year old female, seen in request by Dr. Brett Fairy for evaluation of botulism toxin injections for blepharospasm, her primary care physician is Dr. Dennard Schaumann, [place]ammie Mcgee,    I reviewed and summarized the referring note. PMHX HTN OSA History of shingles involving right arm, right thoracic spine History of left ankle surgery  Since beginning of this year, she noticed gradual worsening uncontrollable bilateral eye blinking, to the point of affecting her driving, she had a cosmetic Botox injection by dermatologist few months ago with mild improvement  Around 10 years ago, she remembers she had a tendency to creasing her forehead, rolled up her eyes, lasting for few years gradually went away, over the years, she also noticed tendency for moving her mouth and nose, scratching her shoulders, clear her throat,  Mild tendency for obsessive-compulsive, need to organize object in certain ways  One of her son has moist tics, obsessive-compulsive tendency  Update November 16, 2021 EMG guided xeomin injection, used 50 units today  PHYSICAL EXAM:   Vitals:   11/16/21 1302  BP: 136/82  Weight: 202 lb (91.6 kg)  Height: '5\' 5"'$  (1.651 m)   Not recorded     Body mass index is 33.61 kg/m.  PHYSICAL EXAMNIATION:  Gen: NAD, conversant, well nourised,  well groomed                     Cardiovascular: Regular rate rhythm, no peripheral edema, warm, nontender. Eyes: Conjunctivae clear without exudates or hemorrhage Neck: Supple, no carotid bruits. Pulmonary: Clear to auscultation bilaterally   NEUROLOGICAL EXAM:  MENTAL STATUS: Speech/cognition: Awake, alert, oriented to history taking and casual conversation, frequent eye blinking, occasionally eye loading, creasing of forehead, CRANIAL NERVES: CN II: Visual fields are full to confrontation. Pupils are round equal and briskly reactive to light. CN III, IV, VI: extraocular movement are normal. No ptosis. CN V: Facial sensation is intact to light touch CN VII: Face is symmetric with normal eye closure  CN VIII: Hearing is normal to causal conversation. CN IX, X: Phonation is normal. CN XI: Head turning and shoulder shrug are intact  MOTOR: There is no pronator drift of out-stretched arms. Muscle bulk and tone are normal. Muscle strength is normal.  Well-healed left medial ankle scar  REFLEXES: Reflexes are 2+ and symmetric at the biceps, triceps, knees, and ankles. Plantar responses are flexor.  SENSORY: Intact to light touch, pinprick and vibratory sensation are intact in fingers and toes.  COORDINATION: There is no trunk or limb dysmetria noted.  GAIT/STANCE: Need push-up to get up from seated position, steady,  REVIEW OF SYSTEMS:  Full 14 system review of systems performed and notable only for as above All other review of systems were negative.   ALLERGIES: No Known Allergies  HOME MEDICATIONS: Current Outpatient Medications  Medication Sig Dispense Refill   Calcium 500 MG tablet Take 600 mg by mouth 2 (two) times daily.     Cholecalciferol (VITAMIN D PO) Take 2,000 Units by mouth daily.     clobetasol (TEMOVATE) 0.05 % external solution Apply topically.     Cyanocobalamin (VITAMIN B 12 PO) Take by mouth.     losartan-hydrochlorothiazide (HYZAAR) 100-25 MG tablet  Take 1 tablet by mouth daily. 90 tablet 3   metoprolol tartrate (LOPRESSOR) 25 MG tablet TAKE ONE TABLET BY MOUTH ONE TIME DAILY 90 tablet 0   omeprazole (PRILOSEC) 40 MG capsule Take 1 capsule (40 mg total) by mouth daily. 30 capsule 3   TURMERIC PO Take by mouth.     Zinc 50 MG TABS Take by mouth.     No current facility-administered medications for this visit.    PAST MEDICAL HISTORY: Past Medical History:  Diagnosis Date   Arthritis    generalized-bilateral hands   Asthma    hx of   Cataract    bilateral-LEFT eye   COPD (chronic obstructive pulmonary disease) ([place])    not being tx'd at this time (03/23/2020)   Diverticulitis    History of shingles    Hypertension    on meds   Lung nodules    Nodules noted on CT scan July 2017   OSA (obstructive sleep apnea)    Partial small bowel obstruction ([place])     PAST SURGICAL HISTORY: Past Surgical History:  Procedure Laterality Date   ANKLE SURGERY  2019   CATARACT EXTRACTION W/PHACO Left 06/08/2015   Procedure: CATARACT EXTRACTION PHACO AND INTRAOCULAR LENS PLACEMENT LEFT EYE CDE=7.18;  Surgeon: Williams Che, MD;  Location: [facility];  Service: Ophthalmology;  Laterality: Left;   DILATION AND CURETTAGE OF UTERUS     X 2   EXERCISE TOLERANCE TEST (GXT)  11/2016   Low Exercise Tolerance - 6:24 min (dyspnea, fatigue), upsloping ST segment depression noted.  Nondiagnostic.  HR 150 bpm = 97% MPHR). Hypertensive response to exercise. NEGATIVE test.  No arrhythmias.   right rotator cuff surgery     TRANSTHORACIC ECHOCARDIOGRAM  11/2016   Normal LV size and function.  GR 1 DD.  Moderate concentric hypertrophy.  Vigorous LV function with EF of 65-70%.   WISDOM TOOTH EXTRACTION      FAMILY HISTORY: Family History  Problem Relation Age of Onset   Hypertension Brother    Stroke Maternal Grandmother    Colon polyps Neg Hx    Colon cancer Neg Hx    Esophageal cancer Neg Hx    Rectal cancer Neg Hx    Stomach cancer Neg Hx      SOCIAL HISTORY: Social History   Socioeconomic History   Marital status: Married    Spouse name: Nicole Kindred   Number of children: 2   Years of education: 13   Highest education level: Not on file  Occupational History   Occupation: Medical illustrator    Comment: Chief Executive Officer  Tobacco Use   Smoking status: Never   Smokeless tobacco: Never  Vaping Use   Vaping Use: Never used  Substance and Sexual Activity   Alcohol use: Yes    Alcohol/week: 14.0 standard drinks of alcohol    Types: 14 Standard drinks or equivalent per week   Drug use: No   Sexual activity: Not on file  Other Topics Concern   Not on file  Social History Narrative   PFT---08-20-2009  fev1-2.04, 92%, fev1/fvc 0.79   Widowed, remarried.  2 kids   Works part time Naval architect       Lives with husband   Right handed   Caffeine: 2 cups of coffee in the AM   Social Determinants of Health   Financial Resource Strain: Not on file  Food Insecurity: Not on file  Transportation Needs: Not on file  Physical Activity: Sufficiently Active (11/24/2016)   Exercise Vital Sign    Days of Exercise per Week: 5 days    Minutes of Exercise per Session: 40 min  Stress: Not on file  Social Connections: Not on file  Intimate Partner Violence: Not on file      Marcial Pacas, M.D. Ph.D.  [facility] [address], [place], [place] [address] Ph: [phone] Fax: [phone]  CC:  Susy Frizzle, MD [address] [place],  [place] [address]  Susy Frizzle, MD

## 2021-11-16 NOTE — Progress Notes (Signed)
Xeomin 50 units x 1 vial  [id number] Exp-01/2022 B/B

## 2021-11-19 ENCOUNTER — Encounter (HOSPITAL_COMMUNITY): Payer: Self-pay

## 2021-11-19 ENCOUNTER — Ambulatory Visit (HOSPITAL_COMMUNITY): Payer: Medicare Other

## 2021-11-19 DIAGNOSIS — M5386 Other specified dorsopathies, lumbar region: Secondary | ICD-10-CM

## 2021-11-19 DIAGNOSIS — M545 Low back pain, unspecified: Secondary | ICD-10-CM | POA: Diagnosis not present

## 2021-11-19 NOTE — Therapy (Signed)
OUTPATIENT PHYSICAL THERAPY THORACOLUMBAR TREATMENT   Patient Name: Lynn Estes MRN: [id number] DOB:02/24/1945, 76 y.o., female Today's Date: 11/19/2021   PT End of Session - 11/19/21 1122     Visit Number 4    Number of Visits 8    Date for PT Re-Evaluation 12/03/21    Authorization Type Medicare and [employer] supplement    Progress Note Due on Visit 10    PT Start Time 1120    PT Stop Time 1203    PT Time Calculation (min) 43 min    Activity Tolerance Patient tolerated treatment well    Behavior During Therapy WFL for tasks assessed/performed              Past Medical History:  Diagnosis Date   Arthritis    generalized-bilateral hands   Asthma    hx of   Cataract    bilateral-LEFT eye   COPD (chronic obstructive pulmonary disease) ([place])    not being tx'd at this time (03/23/2020)   Diverticulitis    History of shingles    Hypertension    on meds   Lung nodules    Nodules noted on CT scan July 2017   OSA (obstructive sleep apnea)    Partial small bowel obstruction [facility])    Past Surgical History:  Procedure Laterality Date   ANKLE SURGERY  2019   CATARACT EXTRACTION W/PHACO Left 06/08/2015   Procedure: CATARACT EXTRACTION PHACO AND INTRAOCULAR LENS PLACEMENT LEFT EYE CDE=7.18;  Surgeon: Williams Che, MD;  Location: [facility];  Service: Ophthalmology;  Laterality: Left;   DILATION AND CURETTAGE OF UTERUS     X 2   EXERCISE TOLERANCE TEST (GXT)  11/2016   Low Exercise Tolerance - 6:24 min (dyspnea, fatigue), upsloping ST segment depression noted.  Nondiagnostic.  HR 150 bpm = 97% MPHR). Hypertensive response to exercise. NEGATIVE test.  No arrhythmias.   right rotator cuff surgery     TRANSTHORACIC ECHOCARDIOGRAM  11/2016   Normal LV size and function.  GR 1 DD.  Moderate concentric hypertrophy.  Vigorous LV function with EF of 65-70%.   WISDOM TOOTH EXTRACTION     Patient Active Problem List   Diagnosis Date Noted   Facial spasm 09/02/2020   OSA on CPAP  05/20/2018   Loud snoring 02/15/2018   Hypoxemia during surgery 02/15/2018   Nasal obstruction 02/15/2018   Vasovagal syncope 11/17/2016   Rapid palpitations 11/17/2016   Abnormal EKG 11/17/2016   Chest pain with low risk for cardiac etiology 11/17/2016   Asthma, mild intermittent, well-controlled 02/02/2014   Cough due to ACE inhibitor 02/02/2012   Chronic insomnia 05/13/2011   Throat tightness 04/15/2011   Seasonal allergic rhinitis 03/02/2010   Essential hypertension 08/18/2009   Lung nodules 08/13/2009    PCP: Jenna Luo, MD  REFERRING PROVIDER: Jenna Luo, MD  REFERRING DIAG:  Diagnosis  M54.31 (ICD-10-CM) - Right sided sciatica    Rationale for Evaluation and Treatment: Rehabilitation  THERAPY DIAG:  Low back pain, unspecified back pain laterality, unspecified chronicity, unspecified whether sciatica present  Sciatica of right side associated with disorder of lumbar spine  ONSET DATE: chronic back pain; worse a couple months ago  SUBJECTIVE:  SUBJECTIVE STATEMENT: 11/19/21:  Reports she had botox in both eyes at neurologist on Tuesday and feels she was she tense with increased pain  Lt shoulder during the shots and has placed iced on it that helped.  Reports lower back is feeling good and has been walking a couple days around 20 feet.  Reports she has some swelling Lt ankle following  Eval subjective: Chronic back pain for years but recently got worse; pain starts in the leg and moves up to the back Painful especially with driving and getting in and out the car  PERTINENT HISTORY:  Shattered left ankle 3-4 years; s/p Dr. Doran Durand Right shoulder rotator cuff repair 12 years ago per Dr. French Ana  PAIN:  Are you having pain? Yes: NPRS scale: 0/10 Pain location: low back and down  right leg Pain description: aching Aggravating factors: when I raise my right leg; standing still too long Relieving factors: rest  PRECAUTIONS: None  WEIGHT BEARING RESTRICTIONS: No  FALLS:  Has patient fallen in last 6 months? Yes. Number of falls 1  OCCUPATION: retired  PLOF: Independent  PATIENT GOALS: be able to walk better   OBJECTIVE:   DIAGNOSTIC FINDINGS:  CLINICAL DATA:  Right-sided sciatica post fall   EXAM: LUMBAR SPINE - COMPLETE 4+ VIEW   COMPARISON:  None Available.   FINDINGS: Five lumbar type vertebra. Lumbar alignment within normal limits. Vertebral body heights are maintained. Mild disc space narrowing L2-L3 and L3-L4 with multilevel degenerative osteophyte. Facet degenerative changes of the lower lumbar spine.   IMPRESSION: Mild multilevel degenerative change.  No acute osseous abnormality     Electronically Signed   By: Donavan Foil M.D.   On: 09/15/2021 18:14    PATIENT SURVEYS:  FOTO 56   COGNITION: Overall cognitive status: Within functional limits for tasks assessed     SENSATION: WFL  POSTURE:  antalgic gait; decreased stance right lower extremity  PALPATION:   LUMBAR ROM:   AROM eval  Flexion 65% available  Extension 30% available  Right lateral flexion   Left lateral flexion   Right rotation   Left rotation    (Blank rows = not tested)   LOWER EXTREMITY MMT:    MMT Right eval Left eval  Hip flexion 3- 4+  Hip extension 3- 4-  Hip abduction    Hip adduction    Hip internal rotation    Hip external rotation    Knee flexion 4+ 4+  Knee extension 4+ 5  Ankle dorsiflexion 5 5            Ankle plantarflexion    Ankle inversion    Ankle eversion     (Blank rows = not tested)   FUNCTIONAL TESTS:  5 times sit to stand: 12.84 sec  GAIT: Distance walked: 100 Assistive device utilized: None Level of assistance: SBA Comments: decreased stance Right lower extremity  TODAY'S TREATMENT:  DATE:  11/19/2021 Educated benefits with compression socks, measurements taken and given ETI paperwork STS 10x eccentric control Squat 10x Lumbar extension 10x Sidelying: abd 10x Prone: POE x 2 min Hip extension 10x  11/11/21: STS 10x eccentric control no HHA  Sidelying: clam 10x 5" Supine: Bridge with GTB around thigh SLR with ab set 10x  11/09/21:  Reviewed goals and educated importance of HEP compliance for maximal benefits  Reviewed log rolling for bed mobility  Supine: bridge 10x 5" cueing for breathing during all exercises.   Diaphragmatic breathing x 3'    Abdominal sets '1\' 3"'$  holds   LTR 5x 10"   March with ab set 5x 5" holds  Prone: POE x 1'   Heel squeeze 5x 5"     11/05/21: physical therapy evaluation and HEP instruction       PATIENT EDUCATION:  Education details: Patient educated on exam findings, POC, scope of PT, HEP. Person educated: Patient Education method: Explanation, Demonstration, and Handouts Education comprehension: verbalized understanding, returned demonstration, verbal cues required, and tactile cues required  HOME EXERCISE PROGRAM: Access Code: [email or web address] URL: [email or web address] Date: 11/05/2021 Prepared by: [facility]  Exercises 11/19/21: standing lumbar extension and squat chair tap 11/09/21: LTR and POE  - Supine Transversus Abdominis Bracing - Hands on Stomach  - 2 x daily - 7 x weekly - 1 sets - 10 reps - Supine Bridge  - 2 x daily - 7 x weekly - 1 sets - 10 reps - Sitting to Supine Roll  - 2 x daily - 7 x weekly - 1 sets - 10 reps - Lying Prone  - 2 x daily - 7 x weekly - 1 sets - 1 reps - 5 min hold  ASSESSMENT:  CLINICAL IMPRESSION: Pt reports she has swelling Lt ankle with weight bearing, educated benefits with compression socks, measurements taken and given ETI handout.  Encouraged to bring with her is  difficulty donning and will show increased ease mechanics.  Continued session focus with core and proximal strengthening.  Added lumbar extension with positive reports following and progressed gluteal strengthening with some cueing for form and mechanics.  No reports of pain through session, was limited by fatigue.    OBJECTIVE IMPAIRMENTS: Abnormal gait, decreased activity tolerance, decreased mobility, difficulty walking, decreased ROM, decreased strength, hypomobility, increased fascial restrictions, impaired perceived functional ability, increased muscle spasms, impaired flexibility, and pain.   ACTIVITY LIMITATIONS: carrying, lifting, bending, standing, squatting, sleeping, stairs, locomotion level, and caring for others  PARTICIPATION LIMITATIONS: meal prep, cleaning, laundry, community activity, and yard work  [employer] POTENTIAL: Good  CLINICAL DECISION MAKING: Stable/uncomplicated  EVALUATION COMPLEXITY: Low   GOALS: Goals reviewed with patient? No  SHORT TERM GOALS: Target date: 11/19/2021  Patient will be independent with initial HEP  Baseline: Goal status: IN PROGRESS  2.   Patient will report at least 50% improvement in overall symptoms and/or function to demonstrate improved functional mobility Baseline:  Goal status: IN PROGRESS    LONG TERM GOALS: Target date: 12/03/2021  Patient will be independent in self management strategies to improve quality of life and functional outcomes.  Baseline:  Goal status: IN PROGRESS  2.   Patient will report at least 50% improvement in overall symptoms and/or function to demonstrate improved functional mobility   Baseline:  Goal status: IN PROGRESS  3.  Patient will increase right leg MMTs to 5/5 without pain to promote return to ambulation community distances with minimal deviation.  Baseline:  Goal status:  IN PROGRESS  4.  Patient will improve 5 times sit to stand score from 12.84 sec sec to 10 sec to demonstrate  improved functional mobility and increased lower extremity strength.   Baseline: 12.84 sec Goal status: IN PROGRESS  5.  Patient will ambulate x 20 minutes without pain for fitness purposes Baseline:  Goal status: IN PROGRESS PLAN:  PT FREQUENCY: 2x/week  PT DURATION: 4 weeks  PLANNED INTERVENTIONS: Therapeutic exercises, Therapeutic activity, Neuromuscular re-education, Balance training, Gait training, Patient/Family education, Joint manipulation, Joint mobilization, Stair training, Orthotic/Fit training, DME instructions, Aquatic Therapy, Dry Needling, Electrical stimulation, Spinal manipulation, Spinal mobilization, Cryotherapy, Moist heat, Compression bandaging, scar mobilization, Splintting, Taping, Traction, Ultrasound, Ionotophoresis '4mg'$ /ml Dexamethasone, and Manual therapy   PLAN FOR NEXT SESSION: Progress core and proximal strengthening and lumbar mobility.  Progress to standing functional strengthening and balance training.    Ihor Austin, LPTA/CLT; CBIS [phone]  12:10 PM, 11/19/21

## 2021-11-23 ENCOUNTER — Ambulatory Visit (HOSPITAL_COMMUNITY): Payer: Medicare Other

## 2021-11-23 ENCOUNTER — Encounter (HOSPITAL_COMMUNITY): Payer: Self-pay

## 2021-11-23 DIAGNOSIS — M545 Low back pain, unspecified: Secondary | ICD-10-CM | POA: Diagnosis not present

## 2021-11-23 DIAGNOSIS — M5386 Other specified dorsopathies, lumbar region: Secondary | ICD-10-CM

## 2021-11-23 NOTE — Therapy (Signed)
OUTPATIENT PHYSICAL THERAPY THORACOLUMBAR TREATMENT   Patient Name: Lynn Estes MRN: [id number] DOB:July 06, 1945, 76 y.o., female Today's Date: 11/23/2021   PT End of Session - 11/23/21 1429     Visit Number 5    Number of Visits 8    Date for PT Re-Evaluation 12/03/21    Authorization Type Medicare and [employer]etna supplement    Progress Note Due on Visit 10    PT Start Time 6333    PT Stop Time 1428    PT Time Calculation (min) 39 min    Activity Tolerance Patient tolerated treatment well    Behavior During Therapy WFL for tasks assessed/performed               Past Medical History:  Diagnosis Date   Arthritis    generalized-bilateral hands   Asthma    hx of   Cataract    bilateral-LEFT eye   COPD (chronic obstructive pulmonary disease) ([place])    not being tx'd at this time (03/23/2020)   Diverticulitis    History of shingles    Hypertension    on meds   Lung nodules    Nodules noted on CT scan July 2017   OSA (obstructive sleep apnea)    Partial small bowel obstruction [facility])    Past Surgical History:  Procedure Laterality Date   ANKLE SURGERY  2019   CATARACT EXTRACTION W/PHACO Left 06/08/2015   Procedure: CATARACT EXTRACTION PHACO AND INTRAOCULAR LENS PLACEMENT LEFT EYE CDE=7.18;  Surgeon: Williams Che, MD;  Location: [facility];  Service: Ophthalmology;  Laterality: Left;   DILATION AND CURETTAGE OF UTERUS     X 2   EXERCISE TOLERANCE TEST (GXT)  11/2016   Low Exercise Tolerance - 6:24 min (dyspnea, fatigue), upsloping ST segment depression noted.  Nondiagnostic.  HR 150 bpm = 97% MPHR). Hypertensive response to exercise. NEGATIVE test.  No arrhythmias.   right rotator cuff surgery     TRANSTHORACIC ECHOCARDIOGRAM  11/2016   Normal LV size and function.  GR 1 DD.  Moderate concentric hypertrophy.  Vigorous LV function with EF of 65-70%.   WISDOM TOOTH EXTRACTION     Patient Active Problem List   Diagnosis Date Noted   Facial spasm 09/02/2020   OSA on  CPAP 05/20/2018   Loud snoring 02/15/2018   Hypoxemia during surgery 02/15/2018   Nasal obstruction 02/15/2018   Vasovagal syncope 11/17/2016   Rapid palpitations 11/17/2016   Abnormal EKG 11/17/2016   Chest pain with low risk for cardiac etiology 11/17/2016   Asthma, mild intermittent, well-controlled 02/02/2014   Cough due to ACE inhibitor 02/02/2012   Chronic insomnia 05/13/2011   Throat tightness 04/15/2011   Seasonal allergic rhinitis 03/02/2010   Essential hypertension 08/18/2009   Lung nodules 08/13/2009    PCP: Jenna Luo, MD  REFERRING PROVIDER: Jenna Luo, MD  REFERRING DIAG:  Diagnosis  M54.31 (ICD-10-CM) - Right sided sciatica    Rationale for Evaluation and Treatment: Rehabilitation  THERAPY DIAG:  Low back pain, unspecified back pain laterality, unspecified chronicity, unspecified whether sciatica present  Sciatica of right side associated with disorder of lumbar spine  ONSET DATE: chronic back pain; worse a couple months ago  SUBJECTIVE:  SUBJECTIVE STATEMENT: 11/23/21:  Pt stated she has been decorating Christmas around her house and has to take rest breaks.  No reports of pain today.  Main difficulty with activity tolerance and balance.  Reports no radicular symptoms for the last 3 days.    Eval subjective: Chronic back pain for years but recently got worse; pain starts in the leg and moves up to the back Painful especially with driving and getting in and out the car  PERTINENT HISTORY:  Shattered left ankle 3-4 years; s/p Dr. Doran Durand Right shoulder rotator cuff repair 12 years ago per Dr. French Ana  PAIN:  Are you having pain? Yes: NPRS scale: 0/10 Pain location: low back and down right leg Pain description: aching Aggravating factors: when I raise my right leg;  standing still too long Relieving factors: rest  PRECAUTIONS: None  WEIGHT BEARING RESTRICTIONS: No  FALLS:  Has patient fallen in last 6 months? Yes. Number of falls 1  OCCUPATION: retired  PLOF: Independent  PATIENT GOALS: be able to walk better   OBJECTIVE:   DIAGNOSTIC FINDINGS:  CLINICAL DATA:  Right-sided sciatica post fall   EXAM: LUMBAR SPINE - COMPLETE 4+ VIEW   COMPARISON:  None Available.   FINDINGS: Five lumbar type vertebra. Lumbar alignment within normal limits. Vertebral body heights are maintained. Mild disc space narrowing L2-L3 and L3-L4 with multilevel degenerative osteophyte. Facet degenerative changes of the lower lumbar spine.   IMPRESSION: Mild multilevel degenerative change.  No acute osseous abnormality     Electronically Signed   By: Donavan Foil M.D.   On: 09/15/2021 18:14    PATIENT SURVEYS:  FOTO 56   COGNITION: Overall cognitive status: Within functional limits for tasks assessed     SENSATION: WFL  POSTURE:  antalgic gait; decreased stance right lower extremity  PALPATION:   LUMBAR ROM:   AROM eval  Flexion 65% available  Extension 30% available  Right lateral flexion   Left lateral flexion   Right rotation   Left rotation    (Blank rows = not tested)   LOWER EXTREMITY MMT:    MMT Right eval Left eval  Hip flexion 3- 4+  Hip extension 3- 4-  Hip abduction    Hip adduction    Hip internal rotation    Hip external rotation    Knee flexion 4+ 4+  Knee extension 4+ 5  Ankle dorsiflexion 5 5            Ankle plantarflexion    Ankle inversion    Ankle eversion     (Blank rows = not tested)   FUNCTIONAL TESTS:  5 times sit to stand: 12.84 sec  GAIT: Distance walked: 100 Assistive device utilized: None Level of assistance: SBA Comments: decreased stance Right lower extremity  TODAY'S TREATMENT:  DATE:  11/23/21: Marching 8in toe tapping with opposite UE 10x Squat then heel raise front of chair 10x 3" SLS Rt 5", Lt 5" Vector stance 3x 5"   11/19/2021 Educated benefits with compression socks, measurements taken and given ETI paperwork STS 10x eccentric control Squat 10x Lumbar extension 10x Sidelying: abd 10x Prone: POE x 2 min Hip extension 10x  11/11/21: STS 10x eccentric control no HHA  Sidelying: clam 10x 5" Supine: Bridge with GTB around thigh SLR with ab set 10x  11/09/21:  Reviewed goals and educated importance of HEP compliance for maximal benefits  Reviewed log rolling for bed mobility  Supine: bridge 10x 5" cueing for breathing during all exercises.   Diaphragmatic breathing x 3'    Abdominal sets '1\' 3"'$  holds   LTR 5x 10"   March with ab set 5x 5" holds  Prone: POE x 1'   Heel squeeze 5x 5"     11/05/21: physical therapy evaluation and HEP instruction       PATIENT EDUCATION:  Education details: Patient educated on exam findings, POC, scope of PT, HEP. Person educated: Patient Education method: Explanation, Demonstration, and Handouts Education comprehension: verbalized understanding, returned demonstration, verbal cues required, and tactile cues required  HOME EXERCISE PROGRAM: Access Code: [email or web address] URL: [email or web address] Date: 11/05/2021 Prepared by: [facility]  Exercises 11/23/21: SLS and sidestep with RTB 11/19/21: standing lumbar extension and squat chair tap 11/09/21: LTR and POE  - Supine Transversus Abdominis Bracing - Hands on Stomach  - 2 x daily - 7 x weekly - 1 sets - 10 reps - Supine Bridge  - 2 x daily - 7 x weekly - 1 sets - 10 reps - Sitting to Supine Roll  - 2 x daily - 7 x weekly - 1 sets - 10 reps - Lying Prone  - 2 x daily - 7 x weekly - 1 sets - 1 reps - 5 min hold  ASSESSMENT:  CLINICAL IMPRESSION  Session focus with core and proximal stability.  Added balance activities with  increased difficulty with SLS based activities, required intermittent HHA.  No reports of pain through session.  Added SLS and sidestep with theraband to HEP with printout give.    OBJECTIVE IMPAIRMENTS: Abnormal gait, decreased activity tolerance, decreased mobility, difficulty walking, decreased ROM, decreased strength, hypomobility, increased fascial restrictions, impaired perceived functional ability, increased muscle spasms, impaired flexibility, and pain.   ACTIVITY LIMITATIONS: carrying, lifting, bending, standing, squatting, sleeping, stairs, locomotion level, and caring for others  PARTICIPATION LIMITATIONS: meal prep, cleaning, laundry, community activity, and yard work  [employer] POTENTIAL: Good  CLINICAL DECISION MAKING: Stable/uncomplicated  EVALUATION COMPLEXITY: Low   GOALS: Goals reviewed with patient? No  SHORT TERM GOALS: Target date: 11/19/2021  Patient will be independent with initial HEP  Baseline: Goal status: IN PROGRESS  2.   Patient will report at least 50% improvement in overall symptoms and/or function to demonstrate improved functional mobility Baseline:  Goal status: IN PROGRESS    LONG TERM GOALS: Target date: 12/03/2021  Patient will be independent in self management strategies to improve quality of life and functional outcomes.  Baseline:  Goal status: IN PROGRESS  2.   Patient will report at least 50% improvement in overall symptoms and/or function to demonstrate improved functional mobility   Baseline:  Goal status: IN PROGRESS  3.  Patient will increase right leg MMTs to 5/5 without pain to promote return to ambulation community distances with minimal deviation.  Baseline:  Goal  status: IN PROGRESS  4.  Patient will improve 5 times sit to stand score from 12.84 sec sec to 10 sec to demonstrate improved functional mobility and increased lower extremity strength.   Baseline: 12.84 sec Goal status: IN PROGRESS  5.  Patient will  ambulate x 20 minutes without pain for fitness purposes Baseline:  Goal status: IN PROGRESS PLAN:  PT FREQUENCY: 2x/week  PT DURATION: 4 weeks  PLANNED INTERVENTIONS: Therapeutic exercises, Therapeutic activity, Neuromuscular re-education, Balance training, Gait training, Patient/Family education, Joint manipulation, Joint mobilization, Stair training, Orthotic/Fit training, DME instructions, Aquatic Therapy, Dry Needling, Electrical stimulation, Spinal manipulation, Spinal mobilization, Cryotherapy, Moist heat, Compression bandaging, scar mobilization, Splintting, Taping, Traction, Ultrasound, Ionotophoresis '4mg'$ /ml Dexamethasone, and Manual therapy   PLAN FOR NEXT SESSION: Progress core and proximal strengthening and lumbar mobility.  Progress to standing functional strengthening and balance training.    Ihor Austin, LPTA/CLT; CBIS [phone]  2:49 PM, 11/23/21

## 2021-11-30 ENCOUNTER — Ambulatory Visit (HOSPITAL_COMMUNITY): Payer: Medicare Other

## 2021-11-30 DIAGNOSIS — M5386 Other specified dorsopathies, lumbar region: Secondary | ICD-10-CM

## 2021-11-30 DIAGNOSIS — M545 Low back pain, unspecified: Secondary | ICD-10-CM

## 2021-11-30 NOTE — Therapy (Addendum)
OUTPATIENT PHYSICAL THERAPY THORACOLUMBAR TREATMENT   Patient Name: Lynn Estes MRN: [id number] DOB:1945/01/09, 76 y.o., female Today's Date: 11/30/2021   PT End of Session - 11/30/21 1351     Visit Number 6    Number of Visits 8    Date for PT Re-Evaluation 12/03/21    Authorization Type Medicare and [employer] supplement    Progress Note Due on Visit 10    PT Start Time 1346    PT Stop Time 1428    PT Time Calculation (min) 42 min    Activity Tolerance Patient tolerated treatment well    Behavior During Therapy WFL for tasks assessed/performed               Past Medical History:  Diagnosis Date   Arthritis    generalized-bilateral hands   Asthma    hx of   Cataract    bilateral-LEFT eye   COPD (chronic obstructive pulmonary disease) ([place])    not being tx'd at this time (03/23/2020)   Diverticulitis    History of shingles    Hypertension    on meds   Lung nodules    Nodules noted on CT scan July 2017   OSA (obstructive sleep apnea)    Partial small bowel obstruction [facility])    Past Surgical History:  Procedure Laterality Date   ANKLE SURGERY  2019   CATARACT EXTRACTION W/PHACO Left 06/08/2015   Procedure: CATARACT EXTRACTION PHACO AND INTRAOCULAR LENS PLACEMENT LEFT EYE CDE=7.18;  Surgeon: Williams Che, MD;  Location: [facility];  Service: Ophthalmology;  Laterality: Left;   DILATION AND CURETTAGE OF UTERUS     X 2   EXERCISE TOLERANCE TEST (GXT)  11/2016   Low Exercise Tolerance - 6:24 min (dyspnea, fatigue), upsloping ST segment depression noted.  Nondiagnostic.  HR 150 bpm = 97% MPHR). Hypertensive response to exercise. NEGATIVE test.  No arrhythmias.   right rotator cuff surgery     TRANSTHORACIC ECHOCARDIOGRAM  11/2016   Normal LV size and function.  GR 1 DD.  Moderate concentric hypertrophy.  Vigorous LV function with EF of 65-70%.   WISDOM TOOTH EXTRACTION     Patient Active Problem List   Diagnosis Date Noted   Facial spasm 09/02/2020   OSA on  CPAP 05/20/2018   Loud snoring 02/15/2018   Hypoxemia during surgery 02/15/2018   Nasal obstruction 02/15/2018   Vasovagal syncope 11/17/2016   Rapid palpitations 11/17/2016   Abnormal EKG 11/17/2016   Chest pain with low risk for cardiac etiology 11/17/2016   Asthma, mild intermittent, well-controlled 02/02/2014   Cough due to ACE inhibitor 02/02/2012   Chronic insomnia 05/13/2011   Throat tightness 04/15/2011   Seasonal allergic rhinitis 03/02/2010   Essential hypertension 08/18/2009   Lung nodules 08/13/2009    PCP: Jenna Luo, MD  REFERRING PROVIDER: Jenna Luo, MD  REFERRING DIAG:  Diagnosis  M54.31 (ICD-10-CM) - Right sided sciatica    Rationale for Evaluation and Treatment: Rehabilitation  THERAPY DIAG:  Low back pain, unspecified back pain laterality, unspecified chronicity, unspecified whether sciatica present  Sciatica of right side associated with disorder of lumbar spine  ONSET DATE: chronic back pain; worse a couple months ago  SUBJECTIVE:  SUBJECTIVE STATEMENT: Feeling good/better overall; but haven't walked much but have been active decorating and cooking.  Has been bending over at the waist to plant; decorate.  States her leg bothers her with lifting it up to get into the car.    Eval subjective: Chronic back pain for years but recently got worse; pain starts in the leg and moves up to the back Painful especially with driving and getting in and out the car  PERTINENT HISTORY:  Shattered left ankle 3-4 years; s/p Dr. Doran Durand Right shoulder rotator cuff repair 12 years ago per Dr. French Ana  PAIN:  Are you having pain? Yes: NPRS scale: 0/10 Pain location: low back and down right leg Pain description: aching Aggravating factors: when I raise my right leg; standing  still too long Relieving factors: rest  PRECAUTIONS: None  WEIGHT BEARING RESTRICTIONS: No  FALLS:  Has patient fallen in last 6 months? Yes. Number of falls 1  OCCUPATION: retired  PLOF: Independent  PATIENT GOALS: be able to walk better   OBJECTIVE:   DIAGNOSTIC FINDINGS:  CLINICAL DATA:  Right-sided sciatica post fall   EXAM: LUMBAR SPINE - COMPLETE 4+ VIEW   COMPARISON:  None Available.   FINDINGS: Five lumbar type vertebra. Lumbar alignment within normal limits. Vertebral body heights are maintained. Mild disc space narrowing L2-L3 and L3-L4 with multilevel degenerative osteophyte. Facet degenerative changes of the lower lumbar spine.   IMPRESSION: Mild multilevel degenerative change.  No acute osseous abnormality     Electronically Signed   By: Donavan Foil M.D.   On: 09/15/2021 18:14    PATIENT SURVEYS:  FOTO 56   COGNITION: Overall cognitive status: Within functional limits for tasks assessed     SENSATION: WFL  POSTURE:  antalgic gait; decreased stance right lower extremity  PALPATION:   LUMBAR ROM:   AROM eval  Flexion 65% available  Extension 30% available  Right lateral flexion   Left lateral flexion   Right rotation   Left rotation    (Blank rows = not tested)   LOWER EXTREMITY MMT:    MMT Right eval Left eval  Hip flexion 3- 4+  Hip extension 3- 4-  Hip abduction    Hip adduction    Hip internal rotation    Hip external rotation    Knee flexion 4+ 4+  Knee extension 4+ 5  Ankle dorsiflexion 5 5            Ankle plantarflexion    Ankle inversion    Ankle eversion     (Blank rows = not tested)   FUNCTIONAL TESTS:  5 times sit to stand: 12.84 sec  GAIT: Distance walked: 100 Assistive device utilized: None Level of assistance: SBA Comments: decreased stance Right lower extremity  TODAY'S TREATMENT:                                                                                                                           DATE:  11/30/21 Sit to stand no UE assist 2 x 10  Supine  LTR x 20 Transverse abdominus contraction 5" x 10 Bridge x 10 SLR x 10 each  Sidelying  Clam 2 x 10 each  Sitting Scapular retractions x 10  Standing: Heel/toe raises 2 x 10 Hip vectors 2" hold x 8 each Slant board 5 x 20" Lumbar extensions against mat table x 10    11/23/21: Marching 8in toe tapping with opposite UE 10x Squat then heel raise front of chair 10x 3" SLS Rt 5", Lt 5" Vector stance 3x 5"   11/19/2021 Educated benefits with compression socks, measurements taken and given ETI paperwork STS 10x eccentric control Squat 10x Lumbar extension 10x Sidelying: abd 10x Prone: POE x 2 min Hip extension 10x  11/11/21: STS 10x eccentric control no HHA  Sidelying: clam 10x 5" Supine: Bridge with GTB around thigh SLR with ab set 10x  11/09/21:  Reviewed goals and educated importance of HEP compliance for maximal benefits  Reviewed log rolling for bed mobility  Supine: bridge 10x 5" cueing for breathing during all exercises.   Diaphragmatic breathing x 3'    Abdominal sets '1\' 3"'$  holds   LTR 5x 10"   March with ab set 5x 5" holds  Prone: POE x 1'   Heel squeeze 5x 5"     11/05/21: physical therapy evaluation and HEP instruction       PATIENT EDUCATION:  Education details: Patient educated on exam findings, POC, scope of PT, HEP. Person educated: Patient Education method: Explanation, Demonstration, and Handouts Education comprehension: verbalized understanding, returned demonstration, verbal cues required, and tactile cues required  HOME EXERCISE PROGRAM: Access Code: [email or web address] URL: [email or web address] Date: 11/05/2021 Prepared by: [facility]  Exercises 11/23/21: SLS and sidestep with RTB 11/19/21: standing lumbar extension and squat chair tap 11/09/21: LTR and POE  - Supine Transversus Abdominis Bracing - Hands on Stomach  - 2 x daily - 7 x weekly - 1 sets - 10  reps - Supine Bridge  - 2 x daily - 7 x weekly - 1 sets - 10 reps - Sitting to Supine Roll  - 2 x daily - 7 x weekly - 1 sets - 10 reps - Lying Prone  - 2 x daily - 7 x weekly - 1 sets - 1 reps - 5 min hold  ASSESSMENT:  CLINICAL IMPRESSION Today's session continued to with lower extremity and core strengthening.  Patient with noted improvement and decreased pain with supine to sit log roll technique. Patient continued to demonstrate limited hip extension with hip vectors.  Encouraged compliance with HEP and bridging; she states she has not been doing bridges at home.  Patient will benefit from continued skilled therapy services to address deficits and promote return to optimal function.       OBJECTIVE IMPAIRMENTS: Abnormal gait, decreased activity tolerance, decreased mobility, difficulty walking, decreased ROM, decreased strength, hypomobility, increased fascial restrictions, impaired perceived functional ability, increased muscle spasms, impaired flexibility, and pain.   ACTIVITY LIMITATIONS: carrying, lifting, bending, standing, squatting, sleeping, stairs, locomotion level, and caring for others  PARTICIPATION LIMITATIONS: meal prep, cleaning, laundry, community activity, and yard work  [employer] POTENTIAL: Good  CLINICAL DECISION MAKING: Stable/uncomplicated  EVALUATION COMPLEXITY: Low   GOALS: Goals reviewed with patient? No  SHORT TERM GOALS: Target date: 11/19/2021  Patient will be independent with initial HEP  Baseline: Goal status: IN PROGRESS  2.   Patient will report at least 50% improvement in overall symptoms  and/or function to demonstrate improved functional mobility Baseline:  Goal status: IN PROGRESS    LONG TERM GOALS: Target date: 12/03/2021  Patient will be independent in self management strategies to improve quality of life and functional outcomes.  Baseline:  Goal status: IN PROGRESS  2.   Patient will report at least 50% improvement in overall  symptoms and/or function to demonstrate improved functional mobility   Baseline:  Goal status: IN PROGRESS  3.  Patient will increase right leg MMTs to 5/5 without pain to promote return to ambulation community distances with minimal deviation.  Baseline:  Goal status: IN PROGRESS  4.  Patient will improve 5 times sit to stand score from 12.84 sec sec to 10 sec to demonstrate improved functional mobility and increased lower extremity strength.   Baseline: 12.84 sec Goal status: IN PROGRESS  5.  Patient will ambulate x 20 minutes without pain for fitness purposes Baseline:  Goal status: IN PROGRESS PLAN:  PT FREQUENCY: 2x/week  PT DURATION: 4 weeks  PLANNED INTERVENTIONS: Therapeutic exercises, Therapeutic activity, Neuromuscular re-education, Balance training, Gait training, Patient/Family education, Joint manipulation, Joint mobilization, Stair training, Orthotic/Fit training, DME instructions, Aquatic Therapy, Dry Needling, Electrical stimulation, Spinal manipulation, Spinal mobilization, Cryotherapy, Moist heat, Compression bandaging, scar mobilization, Splintting, Taping, Traction, Ultrasound, Ionotophoresis '4mg'$ /ml Dexamethasone, and Manual therapy   PLAN FOR NEXT SESSION: Progress core and proximal strengthening and lumbar mobility.  Progress to standing functional strengthening and balance training.    2:30 PM, 11/30/21 Jibril Mcminn Small Keta [place] MPT [facility] [place] [phone]

## 2021-12-02 ENCOUNTER — Ambulatory Visit (HOSPITAL_COMMUNITY): Payer: Medicare Other

## 2021-12-02 ENCOUNTER — Encounter (HOSPITAL_COMMUNITY): Payer: Self-pay

## 2021-12-02 DIAGNOSIS — M545 Low back pain, unspecified: Secondary | ICD-10-CM

## 2021-12-02 DIAGNOSIS — M5386 Other specified dorsopathies, lumbar region: Secondary | ICD-10-CM

## 2021-12-02 NOTE — Therapy (Signed)
OUTPATIENT PHYSICAL THERAPY THORACOLUMBAR TREATMENT   Patient Name: Lynn Estes MRN: [id number] DOB:07-May-1945, 76 y.o., female Today's Date: 12/03/2021   PT End of Session - 12/02/21 1317     Visit Number 7    Number of Visits 8    Date for PT Re-Evaluation 12/03/21    Authorization Type Medicare and [employer]et[employer] supplement    Progress Note Due on Visit 10    PT Start Time 1308   late sign in   PT Stop Time 1343    PT Time Calculation (min) 35 min    Activity Tolerance Patient tolerated treatment well    Behavior During Therapy [facility] for tasks assessed/performed             12/02/21 1317  PT Visits / Re-Eval  Visit Number 7  Number of Visits 8  Date for PT Re-Evaluation 12/03/21  Authorization  Authorization Type Medicare and Aetna supplement  Progress Note Due on Visit 10  PT Time Calculation  PT Start Time 1308 (late sign in)  PT Stop Time 1343  PT Time Calculation (min) 35 min  PT - End of Session  Activity Tolerance Patient tolerated treatment well  Behavior During Therapy WFL for tasks assessed/performed       Past Medical History:  Diagnosis Date   Arthritis    generalized-bilateral hands   Asthma    hx of   Cataract    bilateral-LEFT eye   COPD (chronic obstructive pulmonary disease) ([place])    not being tx'd at this time (03/23/2020)   Diverticulitis    History of shingles    Hypertension    on meds   Lung nodules    Nodules noted on CT scan July 2017   OSA (obstructive sleep apnea)    Partial small bowel obstruction ([place])    Past Surgical History:  Procedure Laterality Date   ANKLE SURGERY  2019   CATARACT EXTRACTION W/PHACO Left 06/08/2015   Procedure: CATARACT EXTRACTION PHACO AND INTRAOCULAR LENS PLACEMENT LEFT EYE CDE=7.18;  Surgeon: Williams Che, MD;  Location: [facility];  Service: Ophthalmology;  Laterality: Left;   DILATION AND CURETTAGE OF UTERUS     X 2   EXERCISE TOLERANCE TEST (GXT)  11/2016   Low Exercise Tolerance - 6:24 min  (dyspnea, fatigue), upsloping ST segment depression noted.  Nondiagnostic.  HR 150 bpm = 97% MPHR). Hypertensive response to exercise. NEGATIVE test.  No arrhythmias.   right rotator cuff surgery     TRANSTHORACIC ECHOCARDIOGRAM  11/2016   Normal LV size and function.  GR 1 DD.  Moderate concentric hypertrophy.  Vigorous LV function with EF of 65-70%.   WISDOM TOOTH EXTRACTION     Patient Active Problem List   Diagnosis Date Noted   Facial spasm 09/02/2020   OSA on CPAP 05/20/2018   Loud snoring 02/15/2018   Hypoxemia during surgery 02/15/2018   Nasal obstruction 02/15/2018   Vasovagal syncope 11/17/2016   Rapid palpitations 11/17/2016   Abnormal EKG 11/17/2016   Chest pain with low risk for cardiac etiology 11/17/2016   Asthma, mild intermittent, well-controlled 02/02/2014   Cough due to ACE inhibitor 02/02/2012   Chronic insomnia 05/13/2011   Throat tightness 04/15/2011   Seasonal allergic rhinitis 03/02/2010   Essential hypertension 08/18/2009   Lung nodules 08/13/2009    PCP: Jenna Luo, MD  REFERRING PROVIDER: Jenna Luo, MD Next apt: April 2024  REFERRING DIAG:  Diagnosis  M54.31 (ICD-10-CM) - Right sided sciatica    Rationale for  Evaluation and Treatment: Rehabilitation  THERAPY DIAG:  Low back pain, unspecified back pain laterality, unspecified chronicity, unspecified whether sciatica present  Sciatica of right side associated with disorder of lumbar spine  ONSET DATE: chronic back pain; worse a couple months ago  SUBJECTIVE:                                                                                                                                                                                           SUBJECTIVE STATEMENT: No reports of pain today.  Continues to have difficulty with sciatic like symptoms down Rt LE when getting into car and putting on pants.  Increased pain Rt LE following sitting for prolonged periods of time.   Eval  subjective: Chronic back pain for years but recently got worse; pain starts in the leg and moves up to the back Painful especially with driving and getting in and out the car  PERTINENT HISTORY:  Shattered left ankle 3-4 years; s/p Dr. Doran Durand Right shoulder rotator cuff repair 12 years ago per Dr. French Ana  PAIN:  Are you having pain? Yes: NPRS scale: 0/10 Pain location: low back and down right leg Pain description: aching Aggravating factors: when I raise my right leg; standing still too long Relieving factors: rest  PRECAUTIONS: None  WEIGHT BEARING RESTRICTIONS: No  FALLS:  Has patient fallen in last 6 months? Yes. Number of falls 1  OCCUPATION: retired  PLOF: Independent  PATIENT GOALS: be able to walk better   OBJECTIVE:   DIAGNOSTIC FINDINGS:  CLINICAL DATA:  Right-sided sciatica post fall   EXAM: LUMBAR SPINE - COMPLETE 4+ VIEW   COMPARISON:  None Available.   FINDINGS: Five lumbar type vertebra. Lumbar alignment within normal limits. Vertebral body heights are maintained. Mild disc space narrowing L2-L3 and L3-L4 with multilevel degenerative osteophyte. Facet degenerative changes of the lower lumbar spine.   IMPRESSION: Mild multilevel degenerative change.  No acute osseous abnormality     Electronically Signed   By: Donavan Foil M.D.   On: 09/15/2021 18:14    PATIENT SURVEYS:  FOTO 56   COGNITION: Overall cognitive status: Within functional limits for tasks assessed     SENSATION: WFL  POSTURE:  antalgic gait; decreased stance right lower extremity  PALPATION:   LUMBAR ROM:   AROM eval  Flexion 65% available  Extension 30% available  Right lateral flexion   Left lateral flexion   Right rotation   Left rotation    (Blank rows = not tested)   LOWER EXTREMITY MMT:    MMT Right eval Left eval  Hip flexion 3- 4+  Hip extension 3-  4-  Hip abduction    Hip adduction    Hip internal rotation    Hip external rotation    Knee  flexion 4+ 4+  Knee extension 4+ 5  Ankle dorsiflexion 5 5            Ankle plantarflexion    Ankle inversion    Ankle eversion     (Blank rows = not tested)   FUNCTIONAL TESTS:  5 times sit to stand: 12.84 sec  GAIT: Distance walked: 100 Assistive device utilized: None Level of assistance: SBA Comments: decreased stance Right lower extremity  TODAY'S TREATMENT:                                                                                                                          DATE:  12/02/21 Standing: Lumbar extension 10x Hip flex and abd over 6in hurdle to simulate getting into vehicle Squat 10x  Vector stance 3x 5" RTB shoulder extension 10x 5" RTB Row 10x5"  11/30/21 Sit to stand no UE assist 2 x 10  Supine  LTR x 20 Transverse abdominus contraction 5" x 10 Bridge x 10 SLR x 10 each  Sidelying  Clam 2 x 10 each  Sitting Scapular retractions x 10  Standing: Heel/toe raises 2 x 10 Hip vectors 2" hold x 8 each Slant board 5 x 20" Lumbar extensions against mat table x 10    11/23/21: Marching 8in toe tapping with opposite UE 10x Squat then heel raise front of chair 10x 3" SLS Rt 5", Lt 5" Vector stance 3x 5"   11/19/2021 Educated benefits with compression socks, measurements taken and given ETI paperwork STS 10x eccentric control Squat 10x Lumbar extension 10x Sidelying: abd 10x Prone: POE x 2 min Hip extension 10x  11/11/21: STS 10x eccentric control no HHA  Sidelying: clam 10x 5" Supine: Bridge with GTB around thigh SLR with ab set 10x  11/09/21:  Reviewed goals and educated importance of HEP compliance for maximal benefits  Reviewed log rolling for bed mobility  Supine: bridge 10x 5" cueing for breathing during all exercises.   Diaphragmatic breathing x 3'    Abdominal sets '1\' 3"'$  holds   LTR 5x 10"   March with ab set 5x 5" holds  Prone: POE x 1'   Heel squeeze 5x 5"     11/05/21: physical therapy evaluation and HEP  instruction       PATIENT EDUCATION:  Education details: Patient educated on exam findings, POC, scope of PT, HEP. Person educated: Patient Education method: Explanation, Demonstration, and Handouts Education comprehension: verbalized understanding, returned demonstration, verbal cues required, and tactile cues required  HOME EXERCISE PROGRAM: Access Code: [email or web address] URL: [email or web address] Date: 11/05/2021 Prepared by: [facility]  Exercises 11/23/21: SLS and sidestep with RTB 11/19/21: standing lumbar extension and squat chair tap 11/09/21: LTR and POE  - Supine Transversus Abdominis Bracing - Hands on Stomach  - 2 x daily - 7 x weekly -  1 sets - 10 reps - Supine Bridge  - 2 x daily - 7 x weekly - 1 sets - 10 reps - Sitting to Supine Roll  - 2 x daily - 7 x weekly - 1 sets - 10 reps - Lying Prone  - 2 x daily - 7 x weekly - 1 sets - 1 reps - 5 min hold  ASSESSMENT:  CLINICAL IMPRESSION Session focus with core and proximal strengthening.  Added hip flexion/abd movements to simulate getting into car for gluteal and balance training.  This session also added postural strengthening to POC, pt able to complete all exercises with reports of decreased radicular symptoms Rt LE.  Required min cueing for mechanics.       OBJECTIVE IMPAIRMENTS: Abnormal gait, decreased activity tolerance, decreased mobility, difficulty walking, decreased ROM, decreased strength, hypomobility, increased fascial restrictions, impaired perceived functional ability, increased muscle spasms, impaired flexibility, and pain.   ACTIVITY LIMITATIONS: carrying, lifting, bending, standing, squatting, sleeping, stairs, locomotion level, and caring for others  PARTICIPATION LIMITATIONS: meal prep, cleaning, laundry, community activity, and yard work  [employer] POTENTIAL: Good  CLINICAL DECISION MAKING: Stable/uncomplicated  EVALUATION COMPLEXITY: Low   GOALS: Goals reviewed with patient?  No  SHORT TERM GOALS: Target date: 11/19/2021  Patient will be independent with initial HEP  Baseline: 12/02/21:  Reports compliance with HEP 3-4/week Goal status: IN PROGRESS  2.   Patient will report at least 50% improvement in overall symptoms and/or function to demonstrate improved functional mobility Baseline: 12/02/21:  Reports imprv Goal status: IN PROGRESS    LONG TERM GOALS: Target date: 12/03/2021  Patient will be independent in self management strategies to improve quality of life and functional outcomes.  Baseline:  Goal status: IN PROGRESS  2.   Patient will report at least 50% improvement in overall symptoms and/or function to demonstrate improved functional mobility   Baseline:  Goal status: IN PROGRESS  3.  Patient will increase right leg MMTs to 5/5 without pain to promote return to ambulation community distances with minimal deviation.  Baseline:  Goal status: IN PROGRESS  4.  Patient will improve 5 times sit to stand score from 12.84 sec sec to 10 sec to demonstrate improved functional mobility and increased lower extremity strength.   Baseline: 12.84 sec Goal status: IN PROGRESS  5.  Patient will ambulate x 20 minutes without pain for fitness purposes Baseline:  Goal status: IN PROGRESS PLAN:  PT FREQUENCY: 2x/week  PT DURATION: 4 weeks  PLANNED INTERVENTIONS: Therapeutic exercises, Therapeutic activity, Neuromuscular re-education, Balance training, Gait training, Patient/Family education, Joint manipulation, Joint mobilization, Stair training, Orthotic/Fit training, DME instructions, Aquatic Therapy, Dry Needling, Electrical stimulation, Spinal manipulation, Spinal mobilization, Cryotherapy, Moist heat, Compression bandaging, scar mobilization, Splintting, Taping, Traction, Ultrasound, Ionotophoresis '4mg'$ /ml Dexamethasone, and Manual therapy   PLAN FOR NEXT SESSION: Review goals next session.  Progress core and proximal strengthening and lumbar  mobility.  Progress to standing functional strengthening and balance training.    Ihor Austin, LPTA/CLT; CBIS [phone]  10:24 AM, 12/03/21

## 2021-12-07 ENCOUNTER — Ambulatory Visit (HOSPITAL_COMMUNITY): Payer: Medicare Other | Attending: Family Medicine

## 2021-12-07 DIAGNOSIS — M5386 Other specified dorsopathies, lumbar region: Secondary | ICD-10-CM | POA: Diagnosis present

## 2021-12-07 DIAGNOSIS — M545 Low back pain, unspecified: Secondary | ICD-10-CM | POA: Diagnosis present

## 2021-12-07 NOTE — Therapy (Signed)
OUTPATIENT PHYSICAL THERAPY THORACOLUMBAR PROGRESS NOTE/ DISCHARGE NOTE  PHYSICAL THERAPY DISCHARGE SUMMARY  Visits from Start of Care: 8  Current functional level related to goals / functional outcomes: See below   Remaining deficits: See below   Education / Equipment: See below   Patient agrees to discharge. Patient goals were partially met. Patient is being discharged due to being pleased with the current functional level.     Patient Name: Lynn Estes MRN: [id number] DOB:1945-07-17, 76 y.o., female Today's Date: 12/07/2021   PT End of Session - 12/07/21 1035     Visit Number 8    Number of Visits 8    Date for PT Re-Evaluation 12/03/21    Authorization Type Medicare and [employer]etna supplement    Progress Note Due on Visit 10    PT Start Time 1030    PT Stop Time 1110    PT Time Calculation (min) 40 min    Activity Tolerance Patient tolerated treatment well    Behavior During Therapy WFL for tasks assessed/performed                 Past Medical History:  Diagnosis Date   Arthritis    generalized-bilateral hands   Asthma    hx of   Cataract    bilateral-LEFT eye   COPD (chronic obstructive pulmonary disease) ([place])    not being tx'd at this time (03/23/2020)   Diverticulitis    History of shingles    Hypertension    on meds   Lung nodules    Nodules noted on CT scan July 2017   OSA (obstructive sleep apnea)    Partial small bowel obstruction [facility])    Past Surgical History:  Procedure Laterality Date   ANKLE SURGERY  2019   CATARACT EXTRACTION W/PHACO Left 06/08/2015   Procedure: CATARACT EXTRACTION PHACO AND INTRAOCULAR LENS PLACEMENT LEFT EYE CDE=7.18;  Surgeon: Williams Che, MD;  Location: [facility];  Service: Ophthalmology;  Laterality: Left;   DILATION AND CURETTAGE OF UTERUS     X 2   EXERCISE TOLERANCE TEST (GXT)  11/2016   Low Exercise Tolerance - 6:24 min (dyspnea, fatigue), upsloping ST segment depression noted.  Nondiagnostic.  HR  150 bpm = 97% MPHR). Hypertensive response to exercise. NEGATIVE test.  No arrhythmias.   right rotator cuff surgery     TRANSTHORACIC ECHOCARDIOGRAM  11/2016   Normal LV size and function.  GR 1 DD.  Moderate concentric hypertrophy.  Vigorous LV function with EF of 65-70%.   WISDOM TOOTH EXTRACTION     Patient Active Problem List   Diagnosis Date Noted   Facial spasm 09/02/2020   OSA on CPAP 05/20/2018   Loud snoring 02/15/2018   Hypoxemia during surgery 02/15/2018   Nasal obstruction 02/15/2018   Vasovagal syncope 11/17/2016   Rapid palpitations 11/17/2016   Abnormal EKG 11/17/2016   Chest pain with low risk for cardiac etiology 11/17/2016   Asthma, mild intermittent, well-controlled 02/02/2014   Cough due to ACE inhibitor 02/02/2012   Chronic insomnia 05/13/2011   Throat tightness 04/15/2011   Seasonal allergic rhinitis 03/02/2010   Essential hypertension 08/18/2009   Lung nodules 08/13/2009    PCP: Lynn Luo, MD  REFERRING PROVIDER: Jenna Luo, MD Next apt: April 2024  REFERRING DIAG:  Diagnosis  M54.31 (ICD-10-CM) - Right sided sciatica    Rationale for Evaluation and Treatment: Rehabilitation  THERAPY DIAG:  Low back pain, unspecified back pain laterality, unspecified chronicity, unspecified whether sciatica present  Sciatica  of right side associated with disorder of lumbar spine  ONSET DATE: chronic back pain; worse a couple months ago  SUBJECTIVE:                                                                                                                                                                                           SUBJECTIVE STATEMENT:  Continues to have difficulty with sciatic like symptoms down Rt LE when getting into car and putting on pants.  " About 50%" better overall  Eval subjective: Chronic back pain for years but recently got worse; pain starts in the leg and moves up to the back Painful especially with driving and getting  in and out the car  PERTINENT HISTORY:  Shattered left ankle 3-4 years; s/p Dr. Doran Durand Right shoulder rotator cuff repair 12 years ago per Dr. French Ana  PAIN:  Are you having pain? Yes: NPRS scale: 0/10 Pain location: low back and down right leg Pain description: aching Aggravating factors: when I raise my right leg; standing still too long Relieving factors: rest  PRECAUTIONS: None  WEIGHT BEARING RESTRICTIONS: No  FALLS:  Has patient fallen in last 6 months? Yes. Number of falls 1  OCCUPATION: retired  PLOF: Independent  PATIENT GOALS: be able to walk better   OBJECTIVE:   DIAGNOSTIC FINDINGS:  CLINICAL DATA:  Right-sided sciatica post fall   EXAM: LUMBAR SPINE - COMPLETE 4+ VIEW   COMPARISON:  None Available.   FINDINGS: Five lumbar type vertebra. Lumbar alignment within normal limits. Vertebral body heights are maintained. Mild disc space narrowing L2-L3 and L3-L4 with multilevel degenerative osteophyte. Facet degenerative changes of the lower lumbar spine.   IMPRESSION: Mild multilevel degenerative change.  No acute osseous abnormality     Electronically Signed   By: Donavan Foil M.D.   On: 09/15/2021 18:14    PATIENT SURVEYS:  FOTO 56   COGNITION: Overall cognitive status: Within functional limits for tasks assessed     SENSATION: WFL  POSTURE:  antalgic gait; decreased stance right lower extremity  PALPATION:   LUMBAR ROM:   AROM eval  Flexion 65% available  Extension 30% available  Right lateral flexion   Left lateral flexion   Right rotation   Left rotation    (Blank rows = not tested)   LOWER EXTREMITY MMT:    MMT Right eval Left eval Right  12/07/21 Left 12/07/21  Hip flexion 3- 4+ 4* 5  Hip extension 3- 4- 4 4+  Hip abduction      Hip adduction      Hip internal rotation      Hip external  rotation      Knee flexion 4+ 4+ 5 5  Knee extension 4+ _0 Ankle dorsiflexion _1 Ankle plantarflexion       Ankle inversion      Ankle eversion       (Blank rows = not tested)   FUNCTIONAL TESTS:  5 times sit to stand: 12.84 sec  GAIT: Distance walked: 100 Assistive device utilized: None Level of assistance: SBA Comments: decreased stance Right lower extremity  TODAY'S TREATMENT:                                                                                                                          DATE:  12/07/21 Progress note FOTO 56 5 times sit to stand  10.65 sec Review of HEP and goals     12/02/21 Standing: Lumbar extension 10x Hip flex and abd over 6in hurdle to simulate getting into vehicle Squat 10x  Vector stance 3x 5" RTB shoulder extension 10x 5" RTB Row 10x5"  11/30/21 Sit to stand no UE assist 2 x 10  Supine  LTR x 20 Transverse abdominus contraction 5" x 10 Bridge x 10 SLR x 10 each  Sidelying  Clam 2 x 10 each  Sitting Scapular retractions x 10  Standing: Heel/toe raises 2 x 10 Hip vectors 2" hold x 8 each Slant board 5 x 20" Lumbar extensions against mat table x 10    11/23/21: Marching 8in toe tapping with opposite UE 10x Squat then heel raise front of chair 10x 3" SLS Rt 5", Lt 5" Vector stance 3x 5"   11/19/2021 Educated benefits with compression socks, measurements taken and given ETI paperwork STS 10x eccentric control Squat 10x Lumbar extension 10x Sidelying: abd 10x Prone: POE x 2 min Hip extension 10x  11/11/21: STS 10x eccentric control no HHA  Sidelying: clam 10x 5" Supine: Bridge with GTB around thigh SLR with ab set 10x  11/09/21:  Reviewed goals and educated importance of HEP compliance for maximal benefits  Reviewed log rolling for bed mobility  Supine: bridge 10x 5" cueing for breathing during all exercises.   Diaphragmatic breathing x 3'    Abdominal sets _2  holds   LTR 5x 10"   March with ab set 5x 5" holds  Prone: POE x 1'   Heel squeeze 5x 5"     11/05/21: physical therapy evaluation and HEP  instruction       PATIENT EDUCATION:  Education details: Patient educated on exam findings, POC, scope of PT, HEP. Person educated: Patient Education method: Explanation, Demonstration, and Handouts Education comprehension: verbalized understanding, returned demonstration, verbal cues required, and tactile cues required  HOME EXERCISE PROGRAM: Access Code: [email or web address] URL: [email or web address] Date: 11/05/2021 Prepared by: [facility]  Exercises 11/23/21: SLS and sidestep with RTB 11/19/21: standing lumbar extension and squat chair tap 11/09/21: LTR and POE  - Supine Transversus  Abdominis Bracing - Hands on Stomach  - 2 x daily - 7 x weekly - 1 sets - 10 reps - Supine Bridge  - 2 x daily - 7 x weekly - 1 sets - 10 reps - Sitting to Supine Roll  - 2 x daily - 7 x weekly - 1 sets - 10 reps - Lying Prone  - 2 x daily - 7 x weekly - 1 sets - 1 reps - 5 min hold  ASSESSMENT:  CLINICAL IMPRESSION Progress note today; good improvement with strength; has met 2/2 STG's; 3/5 LTG's and will likely achieve with continued HEP and also discussed aquatic exercise with patient and she agreeable to try this; is agreeable to discharge at this time    OBJECTIVE IMPAIRMENTS: Abnormal gait, decreased activity tolerance, decreased mobility, difficulty walking, decreased ROM, decreased strength, hypomobility, increased fascial restrictions, impaired perceived functional ability, increased muscle spasms, impaired flexibility, and pain.   ACTIVITY LIMITATIONS: carrying, lifting, bending, standing, squatting, sleeping, stairs, locomotion level, and caring for others  PARTICIPATION LIMITATIONS: meal prep, cleaning, laundry, community activity, and yard work  [employer] POTENTIAL: Good  CLINICAL DECISION MAKING: Stable/uncomplicated  EVALUATION COMPLEXITY: Low   GOALS: Goals reviewed with patient? No  SHORT TERM GOALS: Target date: 11/19/2021  Patient will be independent with initial  HEP  Baseline: 12/02/21:  Reports compliance with HEP 3-4/week Goal status: MET  2.   Patient will report at least 50% improvement in overall symptoms and/or function to demonstrate improved functional mobility Baseline: 12/02/21:  Reports improvement of 50% 12/5 Goal status: MET    LONG TERM GOALS: Target date: 12/03/2021  Patient will be independent in self management strategies to improve quality of life and functional outcomes.  Baseline:  Goal status: MET  2.   Patient will report at least 50% improvement in overall symptoms and/or function to demonstrate improved functional mobility   Baseline: 12/07/21 50% Goal status: MET  3.  Patient will increase right leg MMTs to 5/5 without pain to promote return to ambulation community distances with minimal deviation.  Baseline: see above Goal status: IN PROGRESS  4.  Patient will improve 5 times sit to stand score from 12.84 sec sec to 10 sec to demonstrate improved functional mobility and increased lower extremity strength.   Baseline: 12.84 sec; 12/07/21 10.36 sec Goal status: IN PROGRESS  5.  Patient will ambulate x 20 minutes without pain for fitness purposes Baseline:  Goal status: MET PLAN:  PT FREQUENCY: 2x/week  PT DURATION: 4 weeks  PLANNED INTERVENTIONS: Therapeutic exercises, Therapeutic activity, Neuromuscular re-education, Balance training, Gait training, Patient/Family education, Joint manipulation, Joint mobilization, Stair training, Orthotic/Fit training, DME instructions, Aquatic Therapy, Dry Needling, Electrical stimulation, Spinal manipulation, Spinal mobilization, Cryotherapy, Moist heat, Compression bandaging, scar mobilization, Splintting, Taping, Traction, Ultrasound, Ionotophoresis 47m/ml Dexamethasone, and Manual therapy   PLAN FOR NEXT SESSION: discharge  11:07 AM, 12/07/21 Tanny Harnack Small Leonela Kivi MPT [facility] [place] #[phone]:[phone]

## 2021-12-09 ENCOUNTER — Encounter (HOSPITAL_COMMUNITY): Payer: 59

## 2021-12-23 ENCOUNTER — Other Ambulatory Visit: Payer: Self-pay | Admitting: Family Medicine

## 2022-01-05 ENCOUNTER — Telehealth: Payer: Self-pay

## 2022-01-05 NOTE — Telephone Encounter (Signed)
Pt c/o all over body aches for last week. Pt denies fever, chills, nasal congestion or cough. Pt states she had a negative covid test. Pt states she thinks it is related to her arthritis and asks to be referred to Dr. Petra Kuba at [facility], if possible. Thank you.

## 2022-01-14 ENCOUNTER — Ambulatory Visit: Payer: Medicare Other | Admitting: Family Medicine

## 2022-02-16 ENCOUNTER — Other Ambulatory Visit: Payer: Self-pay | Admitting: Family Medicine

## 2022-02-17 NOTE — Telephone Encounter (Signed)
Requested medication (s) are due for refill today: yes  Requested medication (s) are on the active medication list: yes  Last refill:  12/24/21  Future visit scheduled: yes  Notes to clinic:  Unable to refill per protocol, courtesy refill already given, routing for provider approval.      Requested Prescriptions  Pending Prescriptions Disp Refills   metoprolol tartrate (LOPRESSOR) 25 MG tablet [Pharmacy Med Name: Metoprolol Tartrate Oral Tablet 25 MG] 30 tablet 0    Sig: TAKE ONE TABLET BY MOUTH ONE TIME DAILY     Cardiovascular:  Beta Blockers Failed - 02/16/2022  9:57 PM      Failed - Valid encounter within last 6 months    Recent Outpatient Visits           9 months ago General medical exam   [place] Susy Frizzle, MD   1 year ago Syncope, unspecified syncope type   [place] Pickard, Cammie Mcgee, MD   1 year ago Screening cholesterol level   [place] Pickard, Cammie Mcgee, MD   2 years ago General medical exam   [place] Susy Frizzle, MD   4 years ago Screening cholesterol level   [place] Pickard, Cammie Mcgee, MD       Future Appointments             In 2 months Pickard, Cammie Mcgee, MD [place]            Passed - Last BP in normal range    BP Readings from Last 1 Encounters:  11/16/21 136/82         Passed - Last Heart Rate in normal range    Pulse Readings from Last 1 Encounters:  10/19/21 75

## 2022-03-28 ENCOUNTER — Ambulatory Visit: Payer: Medicare Other | Admitting: Adult Health

## 2022-03-31 LAB — HM MAMMOGRAPHY

## 2022-04-26 ENCOUNTER — Ambulatory Visit: Payer: Medicare Other | Admitting: Adult Health

## 2022-04-29 ENCOUNTER — Encounter: Payer: Self-pay | Admitting: Family Medicine

## 2022-04-29 ENCOUNTER — Ambulatory Visit (INDEPENDENT_AMBULATORY_CARE_PROVIDER_SITE_OTHER): Payer: Medicare Other | Admitting: Family Medicine

## 2022-04-29 VITALS — BP 126/68 | HR 72 | Temp 98.0°F | Ht 65.0 in | Wt 203.2 lb

## 2022-04-29 DIAGNOSIS — Z0000 Encounter for general adult medical examination without abnormal findings: Secondary | ICD-10-CM

## 2022-04-29 DIAGNOSIS — Z0001 Encounter for general adult medical examination with abnormal findings: Secondary | ICD-10-CM | POA: Diagnosis not present

## 2022-04-29 DIAGNOSIS — Z780 Asymptomatic menopausal state: Secondary | ICD-10-CM | POA: Diagnosis not present

## 2022-04-29 DIAGNOSIS — Z1231 Encounter for screening mammogram for malignant neoplasm of breast: Secondary | ICD-10-CM

## 2022-04-29 DIAGNOSIS — E559 Vitamin D deficiency, unspecified: Secondary | ICD-10-CM

## 2022-04-29 DIAGNOSIS — E781 Pure hyperglyceridemia: Secondary | ICD-10-CM

## 2022-04-29 DIAGNOSIS — I10 Essential (primary) hypertension: Secondary | ICD-10-CM

## 2022-04-29 NOTE — Progress Notes (Signed)
Subjective:    Patient ID: Lynn Estes, female    DOB: [birth year], 77 y.o.   MRN: [id number]  HPI Patient is a very sweet 77 year old Caucasian female here today for a physical exam.  Patient had a colonoscopy in 2022 that showed severe diverticulosis.  Last mammogram was 2/23 and was normal.  DEXA was 2019 and is due.  Patient does not require Pap smear due to being greater than 65.  Immunization records are listed below. Immunization History  Administered Date(s) Administered   Fluad Quad(high Dose 65+) 10/03/2018   Influenza Split 11/10/2011, 10/03/2013   Influenza, High Dose Seasonal PF 12/05/2017   Influenza,inj,Quad PF,6+ Mos 11/22/2012   Influenza,inj,quad, With Preservative 10/04/2018   PFIZER(Purple Top)SARS-COV-2 Vaccination 02/09/2019, 03/02/2019   Pneumococcal Polysaccharide-23 09/10/2011, 10/03/2012   Pneumococcal-Unspecified 01/03/2017   Tdap 08/29/2011    Past Medical History:  Diagnosis Date   Arthritis    generalized-bilateral hands   Asthma    hx of   Cataract    bilateral-LEFT eye   COPD (chronic obstructive pulmonary disease) (HCC)    not being tx'd at this time (03/23/2020)   Diverticulitis    History of shingles    Hypertension    on meds   Lung nodules    Nodules noted on CT scan July 2017   OSA (obstructive sleep apnea)    Partial small bowel obstruction [facility])    Past Surgical History:  Procedure Laterality Date   ANKLE SURGERY  2019   CATARACT EXTRACTION W/PHACO Left 06/08/2015   Procedure: CATARACT EXTRACTION PHACO AND INTRAOCULAR LENS PLACEMENT LEFT EYE CDE=7.18;  Surgeon: Susa Simmonds, MD;  Location: [facility];  Service: Ophthalmology;  Laterality: Left;   DILATION AND CURETTAGE OF UTERUS     X 2   EXERCISE TOLERANCE TEST (GXT)  11/2016   Low Exercise Tolerance - 6:24 min (dyspnea, fatigue), upsloping ST segment depression noted.  Nondiagnostic.  HR 150 bpm = 97% MPHR). Hypertensive response to exercise. NEGATIVE test.  No arrhythmias.    right rotator cuff surgery     TRANSTHORACIC ECHOCARDIOGRAM  11/2016   Normal LV size and function.  GR 1 DD.  Moderate concentric hypertrophy.  Vigorous LV function with EF of 65-70%.   WISDOM TOOTH EXTRACTION     Current Outpatient Medications on File Prior to Visit  Medication Sig Dispense Refill   Calcium 500 MG tablet Take 600 mg by mouth 2 (two) times daily.     Cholecalciferol (VITAMIN D PO) Take 2,000 Units by mouth daily.     clobetasol (TEMOVATE) 0.05 % external solution Apply topically.     Cyanocobalamin (VITAMIN B 12 PO) Take by mouth.     losartan-hydrochlorothiazide (HYZAAR) 100-25 MG tablet Take 1 tablet by mouth daily. 90 tablet 3   metoprolol tartrate (LOPRESSOR) 25 MG tablet TAKE ONE TABLET BY MOUTH ONE TIME DAILY 90 tablet 0   omeprazole (PRILOSEC) 40 MG capsule Take 1 capsule (40 mg total) by mouth daily. 30 capsule 3   TURMERIC PO Take by mouth.     Zinc 50 MG TABS Take by mouth.     Current Facility-Administered Medications on File Prior to Visit  Medication Dose Route Frequency Provider Last Rate Last Admin   incobotulinumtoxinA (XEOMIN) 50 units injection 50 Units  50 Units Intramuscular Q90 days Levert Feinstein, MD   50 Units at 11/16/21 1333   incobotulinumtoxinA (XEOMIN) 50 units injection 50 Units  50 Units Intramuscular Q90 days Levert Feinstein, MD  No Known Allergies Social History   Socioeconomic History   Marital status: Married    Spouse name: Alinda Money   Number of children: 2   Years of education: 13   Highest education level: Not on file  Occupational History   Occupation: Advertising account planner    Comment: Event organiser  Tobacco Use   Smoking status: Never   Smokeless tobacco: Never  Vaping Use   Vaping Use: Never used  Substance and Sexual Activity   Alcohol use: Yes    Alcohol/week: 14.0 standard drinks of alcohol    Types: 14 Standard drinks or equivalent per week   Drug use: No   Sexual activity: Not on file  Other Topics Concern   Not  on file  Social History Narrative   PFT---08-20-2009   fev1-2.04, 92%, fev1/fvc 0.79   Widowed, remarried.  2 kids   Works part time Customer service manager       Lives with husband   Right handed   Caffeine: 2 cups of coffee in the AM   Social Determinants of Health   Financial Resource Strain: Not on file  Food Insecurity: Not on file  Transportation Needs: Not on file  Physical Activity: Sufficiently Active (11/24/2016)   Exercise Vital Sign    Days of Exercise per Week: 5 days    Minutes of Exercise per Session: 40 min  Stress: Not on file  Social Connections: Not on file  Intimate Partner Violence: Not on file   Family History  Problem Relation Age of Onset   Hypertension Brother    Stroke Maternal Grandmother    Colon polyps Neg Hx    Colon cancer Neg Hx    Esophageal cancer Neg Hx    Rectal cancer Neg Hx    Stomach cancer Neg Hx       Review of Systems  All other systems reviewed and are negative.      Objective:   Physical Exam Vitals reviewed.  Constitutional:      General: She is not in acute distress.    Appearance: She is well-developed. She is not diaphoretic.  HENT:     Head: Normocephalic and atraumatic.     Right Ear: External ear normal.     Left Ear: External ear normal.     Nose: Nose normal.     Mouth/Throat:     Pharynx: No oropharyngeal exudate.  Eyes:     General: No scleral icterus.       Right eye: No discharge.        Left eye: No discharge.     Conjunctiva/sclera: Conjunctivae normal.     Pupils: Pupils are equal, round, and reactive to light.  Neck:     Thyroid: No thyromegaly.     Vascular: No JVD.     Trachea: No tracheal deviation.  Cardiovascular:     Rate and Rhythm: Normal rate and regular rhythm.     Heart sounds: Normal heart sounds. No murmur heard.    No friction rub. No gallop.  Pulmonary:     Effort: Pulmonary effort is normal. No respiratory distress.     Breath sounds: Normal breath sounds. No  stridor. No wheezing or rales.  Chest:     Chest wall: No tenderness.  Abdominal:     General: Bowel sounds are normal. There is no distension.     Palpations: Abdomen is soft. There is no mass.     Tenderness: There is no abdominal tenderness. There is no  guarding or rebound.     Hernia: No hernia is present.  Musculoskeletal:     Cervical back: Normal range of motion.     Lumbar back: No spasms or tenderness. Normal range of motion.     Right lower leg: No edema.     Left lower leg: No edema.  Lymphadenopathy:     Cervical: No cervical adenopathy.  Skin:    General: Skin is warm.     Coloration: Skin is not jaundiced or pale.     Findings: No bruising, erythema, lesion or rash.  Neurological:     General: No focal deficit present.     Mental Status: She is oriented to person, place, and time. Mental status is at baseline.     Cranial Nerves: No cranial nerve deficit.     Sensory: No sensory deficit.     Motor: No weakness.     Coordination: Coordination normal.     Gait: Gait normal.     Deep Tendon Reflexes: Reflexes normal.  Psychiatric:        Mood and Affect: Mood normal.        Behavior: Behavior normal.        Thought Content: Thought content normal.        Judgment: Judgment normal.           Assessment & Plan:  General medical exam - Plan: CBC with Differential/Platelet, Lipid panel, COMPLETE METABOLIC PANEL WITH GFR, TSH  Vitamin D deficiency - Plan: CBC with Differential/Platelet, Lipid panel, COMPLETE METABOLIC PANEL WITH GFR, TSH, VITAMIN D 25 Hydroxy (Vit-D Deficiency, Fractures)  Hypertriglyceridemia - Plan: CBC with Differential/Platelet, Lipid panel, COMPLETE METABOLIC PANEL WITH GFR, TSH  Postmenopausal estrogen deficiency - Plan: CBC with Differential/Platelet, Lipid panel, COMPLETE METABOLIC PANEL WITH GFR, TSH, DG Bone Density  Benign essential HTN - Plan: CBC with Differential/Platelet, Lipid panel, COMPLETE METABOLIC PANEL WITH GFR,  TSH  Encounter for screening mammogram for malignant neoplasm of breast - Plan: MM Digital Screening Patient denies any depression.  She denies any falls.  She denies any memory loss.  Her shots are up-to-date except for a tetanus shot and shingles vaccine which she politely declined.  I will schedule her for mammogram.  I will schedule her for a bone density test.  I will monitor her thyroid with a TSH.  I will monitor her vitamin D level.  Check CBC CMP and a lipid panel.  Goal LDL cholesterol is less than 100.  Blood pressure today is excellent

## 2022-04-30 LAB — LIPID PANEL
Cholesterol: 210 mg/dL — ABNORMAL HIGH (ref <200)
HDL: 67 mg/dL (ref >=50)
LDL Cholesterol (Calc): 111 mg/dL (calc) — ABNORMAL HIGH
Non-HDL Cholesterol (Calc): 143 mg/dL (calc) — ABNORMAL HIGH (ref <130)
Total CHOL/HDL Ratio: 3.1 (calc) (ref <5.0)
Triglycerides: 202 mg/dL — ABNORMAL HIGH (ref <150)

## 2022-04-30 LAB — CBC WITH DIFFERENTIAL/PLATELET
Absolute Monocytes: 493 cells/uL (ref 200–950)
Basophils Absolute: 70 cells/uL (ref 0–200)
Basophils Relative: 1.1 %
Eosinophils Absolute: 122 cells/uL (ref 15–500)
Eosinophils Relative: 1.9 %
HCT: 41.3 % (ref 35.0–45.0)
Hemoglobin: 14 g/dL (ref 11.7–15.5)
Lymphs Abs: 2426 cells/uL (ref 850–3900)
MCH: 30.4 pg (ref 27.0–33.0)
MCHC: 33.9 g/dL (ref 32.0–36.0)
MCV: 89.6 fL (ref 80.0–100.0)
MPV: 9 fL (ref 7.5–12.5)
Monocytes Relative: 7.7 %
Neutro Abs: 3290 cells/uL (ref 1500–7800)
Neutrophils Relative %: 51.4 %
Platelets: 360 10*3/uL (ref 140–400)
RBC: 4.61 10*6/uL (ref 3.80–5.10)
RDW: 12.7 % (ref 11.0–15.0)
Total Lymphocyte: 37.9 %
WBC: 6.4 10*3/uL (ref 3.8–10.8)

## 2022-04-30 LAB — COMPLETE METABOLIC PANEL WITH GFR
AG Ratio: 1.9 (calc) (ref 1.0–2.5)
ALT: 23 U/L (ref 6–29)
AST: 17 U/L (ref 10–35)
Albumin: 4.5 g/dL (ref 3.6–5.1)
Alkaline phosphatase (APISO): 68 U/L (ref 37–153)
BUN: 14 mg/dL (ref 7–25)
CO2: 26 mmol/L (ref 20–32)
Calcium: 9.7 mg/dL (ref 8.6–10.4)
Chloride: 98 mmol/L (ref 98–110)
Creat: 0.62 mg/dL (ref 0.60–1.00)
Globulin: 2.4 g/dL (calc) (ref 1.9–3.7)
Glucose, Bld: 102 mg/dL — ABNORMAL HIGH (ref 65–99)
Potassium: 4.4 mmol/L (ref 3.5–5.3)
Sodium: 134 mmol/L — ABNORMAL LOW (ref 135–146)
Total Bilirubin: 0.5 mg/dL (ref 0.2–1.2)
Total Protein: 6.9 g/dL (ref 6.1–8.1)
eGFR: 93 mL/min/{1.73_m2} (ref >=60)

## 2022-04-30 LAB — VITAMIN D 25 HYDROXY (VIT D DEFICIENCY, FRACTURES): Vit D, 25-Hydroxy: 34 ng/mL (ref 30–100)

## 2022-04-30 LAB — TSH: TSH: 1.74 mIU/L (ref 0.40–4.50)

## 2022-05-03 ENCOUNTER — Other Ambulatory Visit: Payer: Self-pay | Admitting: Family Medicine

## 2022-05-17 ENCOUNTER — Other Ambulatory Visit: Payer: Self-pay | Admitting: Family Medicine

## 2022-05-17 NOTE — Telephone Encounter (Signed)
Requested Prescriptions  Pending Prescriptions Disp Refills   metoprolol tartrate (LOPRESSOR) 25 MG tablet [Pharmacy Med Name: Metoprolol Tartrate Oral Tablet 25 MG] 90 tablet 1    Sig: TAKE ONE TABLET BY MOUTH ONE TIME DAILY     Cardiovascular:  Beta Blockers Failed - 05/17/2022  7:24 AM      Failed - Valid encounter within last 6 months    Recent Outpatient Visits           1 year ago General medical exam   [facility] Donita Brooks, MD   1 year ago Syncope, unspecified syncope type   [facility] Donita Brooks, MD   2 years ago Screening cholesterol level   [facility] Pickard, Priscille Heidelberg, MD   3 years ago General medical exam   [facility] Donita Brooks, MD   4 years ago Screening cholesterol level   [facility] Pickard, Priscille Heidelberg, MD              Passed - Last BP in normal range    BP Readings from Last 1 Encounters:  04/29/22 126/68         Passed - Last Heart Rate in normal range    Pulse Readings from Last 1 Encounters:  04/29/22 72

## 2022-05-24 ENCOUNTER — Telehealth: Payer: Self-pay

## 2022-05-24 NOTE — Telephone Encounter (Signed)
Pt called and asks if she would be eligible to take Ozempic for weight loss?

## 2022-06-22 ENCOUNTER — Telehealth: Payer: Self-pay | Admitting: Neurology

## 2022-06-22 NOTE — Telephone Encounter (Signed)
Pt said spastic of left eyelid has gotten worse in the last month, an hardly drive. Would like call from the nurse.

## 2022-06-22 NOTE — Telephone Encounter (Signed)
Returned call to pt who stated she was placed on 2-3 eye meds from [facility] md. But she stated that the blinking spasms are getting much worse approximately a month ago. Pt stated that she has blurry vision so severe that she cannot drive. Advised to go to er if vision loss occurs since we close here at 5pm.

## 2022-06-23 NOTE — Telephone Encounter (Signed)
Put patient on next available for reevaluation/potential reinjection of Xeomin for blepharospasm

## 2022-06-23 NOTE — Telephone Encounter (Signed)
Called pt, LVM to please call office to schedule appointment.  

## 2022-06-28 ENCOUNTER — Encounter: Payer: Self-pay | Admitting: Neurology

## 2022-06-28 ENCOUNTER — Ambulatory Visit (INDEPENDENT_AMBULATORY_CARE_PROVIDER_SITE_OTHER): Payer: Medicare Other | Admitting: Neurology

## 2022-06-28 VITALS — BP 122/72 | HR 75 | Ht 65.0 in | Wt 199.0 lb

## 2022-06-28 DIAGNOSIS — G245 Blepharospasm: Secondary | ICD-10-CM | POA: Insufficient documentation

## 2022-06-28 MED ORDER — INCOBOTULINUMTOXINA 50 UNITS IM SOLR
50.0000 [IU] | Freq: Once | INTRAMUSCULAR | Status: AC
Start: 2022-06-28 — End: 2022-06-28
  Administered 2022-06-28: 50 [IU] via INTRAMUSCULAR

## 2022-06-28 NOTE — Progress Notes (Signed)
xenomin 50units x 1 vial  [id number] [phone] Exp-07.2026 B/B Bacteriostatic 0.9% Sodium Chloride- 1mL  [id number] Expiration: 04.01.2025 NDC: [id number] Dx: G51.39 WITNESSED WU:[id number], RN

## 2022-06-28 NOTE — Progress Notes (Signed)
Chief Complaint  Patient presents with   Follow-up    Rm 13, reevaluate for xeomin, she did feel like it was helpful lasttime      ASSESSMENT AND PLAN  Lynn Estes is a 77 y.o. female   Blepharospasm  EMG guided xeomin injection use 50 units, first injection through our office November 16, 2021  Repeat injection today on June 25th 2024.     DIAGNOSTIC DATA (LABS, IMAGING, TESTING) - I reviewed patient records, labs, notes, testing and imaging myself where available.  Normal TSH, vitamin D, CBC, CMP, LDL 110  MEDICAL HISTORY:  Lynn Estes is a 77 year old female, seen in request by Dr. Vickey Huger for evaluation of botulism toxin injections for blepharospasm, her primary care physician is Dr. Tanya Nones, Priscille Heidelberg,    I reviewed and summarized the referring note. PMHX HTN OSA History of shingles involving right arm, right thoracic spine History of left ankle surgery  Since beginning of this year, she noticed gradual worsening uncontrollable bilateral eye blinking, to the point of affecting her driving, she had a cosmetic Botox injection by dermatologist few months ago with mild improvement  Around 10 years ago, she remembers she had a tendency to creasing her forehead, rolled up her eyes, lasting for few years gradually went away, over the years, she also noticed tendency for moving her mouth and nose, scratching her shoulders, clear her throat,  Mild tendency for obsessive-compulsive, need to organize object in certain ways  One of her son has moist tics, obsessive-compulsive tendency  Update November 16, 2021 EMG guided xeomin injection, used 50 units today  UPDATE June 25th 2024: Lost to follow-up following last injection in November, reported benefit, hope to continue injection, used xeomin 50 units today,  PHYSICAL EXAM:   Vitals:   06/28/22 0839  BP: 122/72  Pulse: 75  SpO2: 96%  Weight: 199 lb (90.3 kg)  Height: 5\' 5"  (1.651 m)    Frequent  eye blinking, occasionally eye loading, creasing of forehead,    REVIEW OF SYSTEMS:  Full 14 system review of systems performed and notable only for as above All other review of systems were negative.   ALLERGIES: No Known Allergies  HOME MEDICATIONS: Current Outpatient Medications  Medication Sig Dispense Refill   Calcium 500 MG tablet Take 600 mg by mouth 2 (two) times daily.     Cholecalciferol (VITAMIN D PO) Take 2,000 Units by mouth daily.     clobetasol (TEMOVATE) 0.05 % external solution Apply topically.     Cyanocobalamin (VITAMIN B 12 PO) Take by mouth.     LOSARTAN POTASSIUM PO Take 1 tablet by mouth daily.     metoprolol tartrate (LOPRESSOR) 25 MG tablet TAKE ONE TABLET BY MOUTH ONE TIME DAILY 90 tablet 1   TURMERIC PO Take by mouth.     losartan-hydrochlorothiazide (HYZAAR) 100-25 MG tablet TAKE ONE TABLET BY MOUTH ONE TIME DAILY (Patient not taking: Reported on 06/28/2022) 90 tablet 1   omeprazole (PRILOSEC) 40 MG capsule Take 1 capsule (40 mg total) by mouth daily. (Patient not taking: Reported on 06/28/2022) 30 capsule 3   Zinc 50 MG TABS Take by mouth.     Current Facility-Administered Medications  Medication Dose Route Frequency Provider Last Rate Last Admin   incobotulinumtoxinA (XEOMIN) 50 units injection 50 Units  50 Units Intramuscular Q90 days Levert Feinstein, MD   50 Units at 11/16/21 1333   incobotulinumtoxinA (XEOMIN) 50 units injection 50 Units  50 Units Intramuscular Q90 days Levert Feinstein,  MD        PAST MEDICAL HISTORY: Past Medical History:  Diagnosis Date   Arthritis    generalized-bilateral hands   Asthma    hx of   Blepharitis of both eyes    Cataract    bilateral-LEFT eye   COPD (chronic obstructive pulmonary disease) (HCC)    not being tx'd at this time (03/23/2020)   Diverticulitis    History of shingles    Hypertension    on meds   Lung nodules    Nodules noted on CT scan July 2017   OSA (obstructive sleep apnea)    Partial small bowel  obstruction (HCC)     PAST SURGICAL HISTORY: Past Surgical History:  Procedure Laterality Date   ANKLE SURGERY  2019   CATARACT EXTRACTION W/PHACO Left 06/08/2015   Procedure: CATARACT EXTRACTION PHACO AND INTRAOCULAR LENS PLACEMENT LEFT EYE CDE=7.18;  Surgeon: Susa Simmonds, MD;  Location: [facility];  Service: Ophthalmology;  Laterality: Left;   DILATION AND CURETTAGE OF UTERUS     X 2   EXERCISE TOLERANCE TEST (GXT)  11/2016   Low Exercise Tolerance - 6:24 min (dyspnea, fatigue), upsloping ST segment depression noted.  Nondiagnostic.  HR 150 bpm = 97% MPHR). Hypertensive response to exercise. NEGATIVE test.  No arrhythmias.   right rotator cuff surgery     TRANSTHORACIC ECHOCARDIOGRAM  11/2016   Normal LV size and function.  GR 1 DD.  Moderate concentric hypertrophy.  Vigorous LV function with EF of 65-70%.   WISDOM TOOTH EXTRACTION      FAMILY HISTORY: Family History  Problem Relation Age of Onset   Hypertension Brother    Stroke Maternal Grandmother    Colon polyps Neg Hx    Colon cancer Neg Hx    Esophageal cancer Neg Hx    Rectal cancer Neg Hx    Stomach cancer Neg Hx     SOCIAL HISTORY: Social History   Socioeconomic History   Marital status: Married    Spouse name: Alinda Money   Number of children: 2   Years of education: 13   Highest education level: Not on file  Occupational History   Occupation: Advertising account planner    Comment: Event organiser  Tobacco Use   Smoking status: Never   Smokeless tobacco: Never  Vaping Use   Vaping Use: Never used  Substance and Sexual Activity   Alcohol use: Yes    Alcohol/week: 14.0 standard drinks of alcohol    Types: 14 Standard drinks or equivalent per week   Drug use: No   Sexual activity: Not on file  Other Topics Concern   Not on file  Social History Narrative   PFT---08-20-2009   fev1-2.04, 92%, fev1/fvc 0.79   Widowed, remarried.  2 kids   Works part time Customer service manager       Lives with husband    Right handed   Caffeine: 2 cups of coffee in the AM   Social Determinants of Health   Financial Resource Strain: Not on file  Food Insecurity: Not on file  Transportation Needs: Not on file  Physical Activity: Sufficiently Active (11/24/2016)   Exercise Vital Sign    Days of Exercise per Week: 5 days    Minutes of Exercise per Session: 40 min  Stress: Not on file  Social Connections: Not on file  Intimate Partner Violence: Not on file      Levert Feinstein, M.D. Ph.D.  [facility] [address], [address]  [place], [place] [address] Ph: [phone] Fax: [phone]  CC:  Donita Brooks, MD [address] [place],  [place] [address]  Donita Brooks, MD

## 2022-07-19 ENCOUNTER — Telehealth: Payer: Self-pay | Admitting: Family Medicine

## 2022-07-19 NOTE — Telephone Encounter (Signed)
Patient called to follow up on ongoing issue with eyes. Patient stated Dr. Noel Gerold put her on antibiotics and three (3) different eye drops to treat cyst, blephoritis, and an eye infection. Antibiotic doesn't seem to be helping. Patient asked provider to let her know if she needs to have her thyroid checked to rule it out (ongoing issues with vision).  Patient stated she will have medical records from Dr. Wells Guiles office sent here.   Please advise at [phone].

## 2022-07-19 NOTE — Telephone Encounter (Signed)
Appointment scheduled with provider for follow up and labs for Monday 7/22.

## 2022-07-25 ENCOUNTER — Encounter: Payer: Self-pay | Admitting: Family Medicine

## 2022-07-25 ENCOUNTER — Ambulatory Visit (INDEPENDENT_AMBULATORY_CARE_PROVIDER_SITE_OTHER): Payer: Medicare Other | Admitting: Family Medicine

## 2022-07-25 VITALS — BP 124/82 | HR 70 | Temp 98.4°F | Ht 65.0 in | Wt 201.4 lb

## 2022-07-25 DIAGNOSIS — H04203 Unspecified epiphora, bilateral lacrimal glands: Secondary | ICD-10-CM | POA: Diagnosis not present

## 2022-07-25 MED ORDER — DICLOFENAC SODIUM 0.1 % OP SOLN: 2.0000 [drp] | Freq: Four times a day (QID) | OPHTHALMIC | 0 refills | Status: DC

## 2022-07-25 NOTE — Progress Notes (Signed)
Subjective:    Patient ID: Lynn Estes, female    DOB: 04-30-1945, 77 y.o.   MRN: [id number]  HPI Has a history of eyelid myokymia and blepharospam for which she has receive botox injections from neurology.  Ever since November, she has been dealing with redness and irritation in her eyes.  She states that her eyes itch and burn.  She reports watering eyes.  She states that she feels like there is a "film over her eye that keeps her from seeing."  She has been seen by ophthalmology and treated blepharitis.  Ultimately they placed her first cold several antibiotic drops and most recently on prednisolone coupled with doxycycline however she has not seen any improvement.  She has been using artificial tears in addition as well as stasis without any improvement.  She questions if it may be due to to the Botox injections Past Medical History:  Diagnosis Date   Arthritis    generalized-bilateral hands   Asthma    hx of   Blepharitis of both eyes    Cataract    bilateral-LEFT eye   COPD (chronic obstructive pulmonary disease) (HCC)    not being tx'd at this time (03/23/2020)   Diverticulitis    History of shingles    Hypertension    on meds   Lung nodules    Nodules noted on CT scan July 2017   OSA (obstructive sleep apnea)    Partial small bowel obstruction [facility])    Past Surgical History:  Procedure Laterality Date   ANKLE SURGERY  2019   CATARACT EXTRACTION W/PHACO Left 06/08/2015   Procedure: CATARACT EXTRACTION PHACO AND INTRAOCULAR LENS PLACEMENT LEFT EYE CDE=7.18;  Surgeon: Susa Simmonds, MD;  Location: [facility];  Service: Ophthalmology;  Laterality: Left;   DILATION AND CURETTAGE OF UTERUS     X 2   EXERCISE TOLERANCE TEST (GXT)  11/2016   Low Exercise Tolerance - 6:24 min (dyspnea, fatigue), upsloping ST segment depression noted.  Nondiagnostic.  HR 150 bpm = 97% MPHR). Hypertensive response to exercise. NEGATIVE test.  No arrhythmias.   right rotator cuff surgery      TRANSTHORACIC ECHOCARDIOGRAM  11/2016   Normal LV size and function.  GR 1 DD.  Moderate concentric hypertrophy.  Vigorous LV function with EF of 65-70%.   WISDOM TOOTH EXTRACTION     Current Outpatient Medications on File Prior to Visit  Medication Sig Dispense Refill   Calcium 500 MG tablet Take 600 mg by mouth 2 (two) times daily.     Cholecalciferol (VITAMIN D PO) Take 2,000 Units by mouth daily.     clobetasol (TEMOVATE) 0.05 % external solution Apply topically.     Cyanocobalamin (VITAMIN B 12 PO) Take by mouth.     LOSARTAN POTASSIUM PO Take 1 tablet by mouth daily.     losartan-hydrochlorothiazide (HYZAAR) 100-25 MG tablet TAKE ONE TABLET BY MOUTH ONE TIME DAILY 90 tablet 1   metoprolol tartrate (LOPRESSOR) 25 MG tablet TAKE ONE TABLET BY MOUTH ONE TIME DAILY 90 tablet 1   omeprazole (PRILOSEC) 40 MG capsule Take 1 capsule (40 mg total) by mouth daily. 30 capsule 3   TURMERIC PO Take by mouth.     Zinc 50 MG TABS Take by mouth.     No current facility-administered medications on file prior to visit.   No Known Allergies Social History   Socioeconomic History   Marital status: Married    Spouse name: Alinda Money   Number of  children: 2   Years of education: 13   Highest education level: Not on file  Occupational History   Occupation: Advertising account planner    Comment: Event organiser  Tobacco Use   Smoking status: Never   Smokeless tobacco: Never  Vaping Use   Vaping status: Never Used  Substance and Sexual Activity   Alcohol use: Yes    Alcohol/week: 14.0 standard drinks of alcohol    Types: 14 Standard drinks or equivalent per week   Drug use: No   Sexual activity: Not on file  Other Topics Concern   Not on file  Social History Narrative   PFT---08-20-2009   fev1-2.04, 92%, fev1/fvc 0.79   Widowed, remarried.  2 kids   Works part time Customer service manager       Lives with husband   Right handed   Caffeine: 2 cups of coffee in the AM   Social  Determinants of Health   Financial Resource Strain: Not on file  Food Insecurity: Not on file  Transportation Needs: Not on file  Physical Activity: Sufficiently Active (11/24/2016)   Exercise Vital Sign    Days of Exercise per Week: 5 days    Minutes of Exercise per Session: 40 min  Stress: Not on file  Social Connections: Not on file  Intimate Partner Violence: Not on file   Family History  Problem Relation Age of Onset   Hypertension Brother    Stroke Maternal Grandmother    Colon polyps Neg Hx    Colon cancer Neg Hx    Esophageal cancer Neg Hx    Rectal cancer Neg Hx    Stomach cancer Neg Hx       Review of Systems  All other systems reviewed and are negative.      Objective:   Physical Exam Vitals reviewed.  Constitutional:      General: She is not in acute distress.    Appearance: She is well-developed. She is not diaphoretic.  HENT:     Head: Normocephalic and atraumatic.     Right Ear: External ear normal.     Left Ear: External ear normal.     Nose: Nose normal.     Mouth/Throat:     Pharynx: No oropharyngeal exudate.  Eyes:     General: Lids are normal. No scleral icterus.       Right eye: No discharge.        Left eye: No discharge.     Extraocular Movements: Extraocular movements intact.     Right eye: Normal extraocular motion.     Left eye: Normal extraocular motion.     Conjunctiva/sclera:     Right eye: Right conjunctiva is injected. No hemorrhage.    Left eye: Left conjunctiva is injected. No hemorrhage.    Pupils: Pupils are equal, round, and reactive to light.   Neck:     Thyroid: No thyromegaly.     Vascular: No JVD.     Trachea: No tracheal deviation.  Cardiovascular:     Rate and Rhythm: Normal rate and regular rhythm.     Heart sounds: Normal heart sounds. No murmur heard.    No friction rub. No gallop.  Pulmonary:     Effort: Pulmonary effort is normal. No respiratory distress.     Breath sounds: Normal breath sounds. No  stridor. No wheezing or rales.  Chest:     Chest wall: No tenderness.  Abdominal:     General: Bowel sounds are normal. There is  no distension.     Palpations: Abdomen is soft. There is no mass.     Tenderness: There is no abdominal tenderness. There is no guarding or rebound.     Hernia: No hernia is present.  Musculoskeletal:     Cervical back: Normal range of motion.     Lumbar back: No spasms or tenderness. Normal range of motion.     Right lower leg: No edema.     Left lower leg: No edema.  Lymphadenopathy:     Cervical: No cervical adenopathy.  Skin:    General: Skin is warm.     Coloration: Skin is not jaundiced or pale.     Findings: No bruising, erythema, lesion or rash.  Neurological:     General: No focal deficit present.     Mental Status: She is oriented to person, place, and time. Mental status is at baseline.     Cranial Nerves: No cranial nerve deficit.     Sensory: No sensory deficit.     Motor: No weakness.     Coordination: Coordination normal.     Gait: Gait normal.     Deep Tendon Reflexes: Reflexes normal.  Psychiatric:        Mood and Affect: Mood normal.        Behavior: Behavior normal.        Thought Content: Thought content normal.        Judgment: Judgment normal.           Assessment & Plan:  Watery eyes Patient's eyes/conjunctiva appear inflamed.  She has been tried on steroids as well as antibiotics on several occasions without any improvement.  I will try the patient on diclofenac eyedrops 2 drops 4 times daily as needed as an anti-inflammatory to see if this helps calm down the irritation.

## 2022-08-15 ENCOUNTER — Encounter: Payer: Self-pay | Admitting: Internal Medicine

## 2022-08-15 ENCOUNTER — Other Ambulatory Visit: Payer: Self-pay

## 2022-08-15 ENCOUNTER — Ambulatory Visit (INDEPENDENT_AMBULATORY_CARE_PROVIDER_SITE_OTHER): Payer: Medicare Other | Admitting: Internal Medicine

## 2022-08-15 VITALS — BP 150/76 | HR 71 | Temp 97.8°F | Resp 18 | Ht 64.57 in | Wt 202.0 lb

## 2022-08-15 DIAGNOSIS — B369 Superficial mycosis, unspecified: Secondary | ICD-10-CM | POA: Diagnosis not present

## 2022-08-15 DIAGNOSIS — J3089 Other allergic rhinitis: Secondary | ICD-10-CM

## 2022-08-15 DIAGNOSIS — L2089 Other atopic dermatitis: Secondary | ICD-10-CM

## 2022-08-15 DIAGNOSIS — L272 Dermatitis due to ingested food: Secondary | ICD-10-CM | POA: Diagnosis not present

## 2022-08-15 MED ORDER — TRIAMCINOLONE ACETONIDE 0.1 % EX OINT: TOPICAL_OINTMENT | CUTANEOUS | 0 refills | Status: DC

## 2022-08-15 MED ORDER — FLUTICASONE PROPIONATE 50 MCG/ACT NA SUSP: 2.0000 | Freq: Every day | NASAL | 5 refills | Status: AC

## 2022-08-15 MED ORDER — CLOTRIMAZOLE 1 % EX CREA: TOPICAL_CREAM | CUTANEOUS | 0 refills | Status: DC

## 2022-08-15 NOTE — Progress Notes (Signed)
NEW PATIENT  Date of Service/Encounter:  08/15/22  Consult requested by: Donita Brooks, MD   Subjective:   Lynn Estes (DOB: June 22, 1945) is a 77 y.o. female who presents to the clinic on 08/15/2022 with a chief complaint of food reaction, post nasal drip, rashes.   .    History obtained from: chart review and patient.  Rhinitis:  Started since she was younger but worse the past few years.  Symptoms include: nasal congestion, rhinorrhea, and post nasal drainage and sneezing, wet cough with PND Worse in AM Has had blepharospasm and blepharitis    Occurs year-round Potential triggers: not sure  Treatments tried:  Oral anti histamines PRN   Previous allergy testing: no  History of sinus surgery: no Nonallergic triggers: none   Rashes: Reports onset about 2-3 weeks ago.  Rash is itchy, red.  It is not hives.   Tried Cerave and cortisone with minimal improvement. She does like gardening.  Denies any insect bites. No recent travel history No new products No new medications. She is worried about food allergies. Has a Dermatologist but has not seen them. Does have hx of scalp psoriasis.    Concern for Food Allergy:  Foods of concern: shellfish  History of reaction:  Rashes and itching on arms and legs several months ago.  She has avoided it since then.    Previous allergy testing no  Past Medical History: Past Medical History:  Diagnosis Date   Arthritis    generalized-bilateral hands   Asthma    hx of   Blepharitis of both eyes    Cataract    bilateral-LEFT eye   COPD (chronic obstructive pulmonary disease) (HCC)    not being tx'd at this time (03/23/2020)   Diverticulitis    History of shingles    Hypertension    on meds   Lung nodules    Nodules noted on CT scan July 2017   OSA (obstructive sleep apnea)    Partial small bowel obstruction [facility])     Past Surgical History: Past Surgical History:  Procedure Laterality Date   ANKLE SURGERY   2019   CATARACT EXTRACTION W/PHACO Left 06/08/2015   Procedure: CATARACT EXTRACTION PHACO AND INTRAOCULAR LENS PLACEMENT LEFT EYE CDE=7.18;  Surgeon: Susa Simmonds, MD;  Location: [facility];  Service: Ophthalmology;  Laterality: Left;   DILATION AND CURETTAGE OF UTERUS     X 2   EXERCISE TOLERANCE TEST (GXT)  11/2016   Low Exercise Tolerance - 6:24 min (dyspnea, fatigue), upsloping ST segment depression noted.  Nondiagnostic.  HR 150 bpm = 97% MPHR). Hypertensive response to exercise. NEGATIVE test.  No arrhythmias.   right rotator cuff surgery     TRANSTHORACIC ECHOCARDIOGRAM  11/2016   Normal LV size and function.  GR 1 DD.  Moderate concentric hypertrophy.  Vigorous LV function with EF of 65-70%.   WISDOM TOOTH EXTRACTION      Family History: Family History  Problem Relation Age of Onset   Hypertension Brother    Stroke Maternal Grandmother    Colon polyps Neg Hx    Colon cancer Neg Hx    Esophageal cancer Neg Hx    Rectal cancer Neg Hx    Stomach cancer Neg Hx     Social History:  Flooring in bedroom: wood Pets: none Tobacco use/exposure: second hand smoke exposure  Job: none  Medication List:  Allergies as of 08/15/2022   No Known Allergies      Medication List  Accurate as of August 15, 2022 10:48 AM. If you have any questions, ask your nurse or doctor.          STOP taking these medications    omeprazole 40 MG capsule Commonly known as: PRILOSEC Stopped by: Birder Robson       TAKE these medications    Calcium 500 MG tablet Take 600 mg by mouth 2 (two) times daily.   clobetasol 0.05 % external solution Commonly known as: TEMOVATE Apply topically.   clotrimazole 1 % cream Commonly known as: LOTRIMIN Apply twice daily to rash for maximum 14 days. Started by: Birder Robson   diclofenac 0.1 % ophthalmic solution Commonly known as: VOLTAREN Place 2 drops into both eyes 4 (four) times daily.   fluticasone 50 MCG/ACT nasal spray Commonly  known as: FLONASE Place 2 sprays into both nostrils daily. Started by: Birder Robson   losartan-hydrochlorothiazide 100-25 MG tablet Commonly known as: HYZAAR TAKE ONE TABLET BY MOUTH ONE TIME DAILY   metoprolol tartrate 25 MG tablet Commonly known as: LOPRESSOR TAKE ONE TABLET BY MOUTH ONE TIME DAILY   triamcinolone ointment 0.1 % Commonly known as: KENALOG Apply twice daily for flare ups below neck, maximum 10 days. Started by: Birder Robson   TURMERIC PO Take by mouth.   VITAMIN B 12 PO Take by mouth.   VITAMIN D PO Take 2,000 Units by mouth daily.   Zinc 50 MG Tabs Take by mouth.         REVIEW OF SYSTEMS: Pertinent positives and negatives discussed in HPI.   Objective:   Physical Exam: BP (!) 150/76   Pulse 71   Temp 97.8 F (36.6 C) (Temporal)   Resp 18   Ht 5' 4.57" (1.64 m)   Wt 202 lb (91.6 kg)   SpO2 94%   BMI 34.07 kg/m  Body mass index is 34.07 kg/m. GEN: alert, well developed HEENT: clear conjunctiva, TM grey and translucent, nose with + inferior turbinate hypertrophy, pink nasal mucosa, slight clear rhinorrhea, + cobblestoning HEART: regular rate and rhythm LUNGS: clear to auscultation bilaterally, no coughing, unlabored respiration ABDOMEN: soft, non distended  SKIN: few circular flat erythematous lesions on R arm and several on L hands, slight scaling   Reviewed:  06/28/2022: seen by neurology for blepharospasm requiring Xeomin injections.   04/29/2022: seen by PCP. PMH includes cataracts, diverticulosis, OSA, HTN, CT nodule.  On lopressor, Hyzaar, Diclofenac eye drops.   11/19/2020: seen by Cardiology for syncope.  Discussed likely vasovagal.    Skin Testing:  Skin prick testing was placed, which includes aeroallergens/foods, histamine control, and saline control.  Verbal consent was obtained prior to placing test.  Patient tolerated procedure well.  Allergy testing results were read and interpreted by myself, documented by clinical  staff. Adequate positive and negative control.  Results discussed with patient/family.  Airborne Adult Perc - 08/15/22 0952     Time Antigen Placed 1478    Allergen Manufacturer Waynette Buttery    Location Back    Number of Test 54    1. Control-Buffer 50% Glycerol Negative    2. Control-Histamine 3+    3. Bahia Negative    4. French Southern Territories Negative    5. Johnson Negative    6. Kentucky Blue Negative    7. Meadow Fescue Negative    8. Perennial Rye Negative    9. Timothy Negative    10. Ragweed Mix Negative    11. Cocklebur Negative    12.  Plantain,  English Negative    13. Baccharis Negative    14. Dog Fennel Negative    15. Russian Thistle Negative    16. Lamb's Quarters Negative    17. Sheep Sorrell Negative    18. Rough Pigweed Negative    19. Marsh Elder, Rough Negative    20. Mugwort, Common Negative    21. Box, Elder Negative    22. Cedar, red Negative    23. Sweet Gum Negative    24. Pecan Pollen Negative    25. Pine Mix Negative    26. Walnut, Black Pollen Negative    27. Red Mulberry Negative    28. Ash Mix Negative    29. Birch Mix Negative    30. Beech American Negative    31. Cottonwood, Guinea-Bissau Negative    32. Hickory, White Negative    33. Maple Mix Negative    34. Oak, Guinea-Bissau Mix Negative    35. Sycamore Eastern Negative    36. Alternaria Alternata Negative    37. Cladosporium Herbarum Negative    38. Aspergillus Mix Negative    39. Penicillium Mix Negative    40. Bipolaris Sorokiniana (Helminthosporium) Negative    41. Drechslera Spicifera (Curvularia) Negative    42. Mucor Plumbeus Negative    43. Fusarium Moniliforme Negative    44. Aureobasidium Pullulans (pullulara) Negative    45. Rhizopus Oryzae Negative    46. Botrytis Cinera Negative    47. Epicoccum Nigrum Negative    48. Phoma Betae Omitted    49. Dust Mite Mix Negative    50. Cat Hair 10,000 BAU/ml Negative    51.  Dog Epithelia Negative    52. Mixed Feathers Negative    53. Horse Epithelia  Negative    54. Cockroach, German Negative    55. Tobacco Leaf Negative             13 Food Perc - 08/15/22 0952       Test Information   Time Antigen Placed 4696    Allergen Manufacturer Waynette Buttery    Location Back    Number of allergen test 13      Food   1. Peanut Negative    2. Soybean Negative    3. Wheat Negative    4. Sesame Negative    5. Milk, Cow Negative    6. Casein Negative    7. Egg White, Chicken Negative    8. Shellfish Mix Negative    9. Fish Mix Negative    10. Cashew Negative    11. Walnut Food Negative    12. Almond Negative    13. Hazelnut Negative               Assessment:   1. Other atopic dermatitis   2. Fungal dermatitis   3. Other allergic rhinitis     Plan/Recommendations:  Rash - Possibly eczematous vs fungal dermatitis.  - Use a gentle, unscented cleanser at the end of the shower/bath (such as Dove unscented bar or baby wash, or Aveeno sensitive body wash). Then rinse, pat half-way dry, and apply a gentle, unscented moisturizer cream or ointment (Cerave, Cetaphil, Eucerin, Aveeno)  all over while still damp. Dry skin makes the itching and rash worse. The skin should be moisturized with a gentle, unscented moisturizer at least twice daily.  - Use only unscented liquid laundry detergent. - Apply prescribed topical steroid (triamcinolone 0.1% below neck) to flared areas (red eczema) after the moisturizer has soaked into  the skin (wait at least 30 minutes). Taper off the topical steroids as the skin improves. Do not use topical steroid for more than 7-10 days at a time.  - Could also be fungal dermatitis, start Clotrimazole 1% cream twice daily for 10 days.   - If no improvement, follow up with Dermatology.    Concern for Food Allergies - Skin testing today to most common foods was negative.  Okay to reintroduce shellfish. Discussed her dermatitis/rashes are not related to food allergy.   Other Allergic Rhinitis: - Due to turbinate  hypertrophy and unresponsive to over the counter meds, performed skin testing to identify aeroallergen triggers.   - Positive skin test 08/2022: none - Avoidance measures discussed. - Use nasal saline rinses before nose sprays such as with Neilmed Sinus Rinse.  Use distilled water.   - Use Flonase 2 sprays each nostril daily. Aim upward and outward.     Return in about 3 months (around 11/15/2022).  Alesia Morin, MD [facility]

## 2022-08-15 NOTE — Patient Instructions (Addendum)
Rash - Possibly eczematous vs fungal dermatitis.  - Use a gentle, unscented cleanser at the end of the shower/bath (such as Dove unscented bar or baby wash, or Aveeno sensitive body wash). Then rinse, pat half-way dry, and apply a gentle, unscented moisturizer cream or ointment (Cerave, Cetaphil, Eucerin, Aveeno)  all over while still damp. Dry skin makes the itching and rash worse. The skin should be moisturized with a gentle, unscented moisturizer at least twice daily.  - Use only unscented liquid laundry detergent. - Apply prescribed topical steroid (triamcinolone 0.1% below neck) to flared areas (red eczema) after the moisturizer has soaked into the skin (wait at least 30 minutes). Taper off the topical steroids as the skin improves. Do not use topical steroid for more than 7-10 days at a time.  - Could also be fungal dermatitis, start Clotrimazole 1% cream twice daily for 10 days.   - If no improvement, follow up with Dermatology.    Concern for Food Allergies - Skin testing today to most common foods was negative.  Okay to reintroduce shellfish.   Other Allergic Rhinitis: - Positive skin test 08/2022: none - Avoidance measures discussed. - Use nasal saline rinses before nose sprays such as with Neilmed Sinus Rinse.  Use distilled water.   - Use Flonase 2 sprays each nostril daily. Aim upward and outward.

## 2022-09-07 ENCOUNTER — Ambulatory Visit: Payer: Medicare Other | Admitting: Neurology

## 2022-09-08 ENCOUNTER — Other Ambulatory Visit: Payer: Self-pay | Admitting: Family Medicine

## 2022-09-08 ENCOUNTER — Telehealth: Payer: Self-pay | Admitting: Family Medicine

## 2022-09-08 DIAGNOSIS — H5713 Ocular pain, bilateral: Secondary | ICD-10-CM

## 2022-09-08 NOTE — Telephone Encounter (Signed)
Patient called to follow up on eye disease; currently seeing provider at [facility] who's only in the office twice a month, every other Monday.   Patient requesting a referral to a different ophthalmologist who's more readily available and is ok with traveling to [place] or surrounding area.   Patient stated she'd like a 2nd opinion; current provider at [facility] has prescribed ongoing antibiotics which aren't helping. Patient has taken them for 3 months to date and was prescribed a refill for another 3 months.   Please advise at [phone].

## 2022-09-28 ENCOUNTER — Ambulatory Visit: Payer: Medicare Other | Admitting: Neurology

## 2022-10-12 ENCOUNTER — Ambulatory Visit (INDEPENDENT_AMBULATORY_CARE_PROVIDER_SITE_OTHER): Payer: Medicare Other | Admitting: Neurology

## 2022-10-12 ENCOUNTER — Encounter: Payer: Self-pay | Admitting: Neurology

## 2022-10-12 VITALS — BP 158/85 | HR 78 | Ht 64.0 in | Wt 200.4 lb

## 2022-10-12 DIAGNOSIS — G245 Blepharospasm: Secondary | ICD-10-CM | POA: Diagnosis not present

## 2022-10-12 MED ORDER — INCOBOTULINUMTOXINA 100 UNITS IM SOLR
50.0000 [IU] | Freq: Once | INTRAMUSCULAR | Status: DC
Start: 2022-10-12 — End: 2022-10-12

## 2022-10-12 NOTE — Progress Notes (Signed)
Chief Complaint  Patient presents with   Injections    Rm 15. Patient doing well and ready for injections.       ASSESSMENT AND PLAN  Lynn Estes is a 77 y.o. female   Blepharospasm  EMG guided xeomin injection use 50 units, first injection through our office November 16, 2021   Responded very well to previous injection  Repeat injection today, Used xeomin 50 units (2.5 units at each injection site)     DIAGNOSTIC DATA (LABS, IMAGING, TESTING) - I reviewed patient records, labs, notes, testing and imaging myself where available.  Normal TSH, vitamin D, CBC, CMP, LDL 110  MEDICAL HISTORY:  Lynn Estes is a 77 year old female, seen in request by Dr. Vickey Huger for evaluation of botulism toxin injections for blepharospasm, her primary care physician is Dr. Tanya Nones, Priscille Heidelberg,    I reviewed and summarized the referring note. PMHX HTN OSA History of shingles involving right arm, right thoracic spine History of left ankle surgery  Since beginning of this year, she noticed gradual worsening uncontrollable bilateral eye blinking, to the point of affecting her driving, she had a cosmetic Botox injection by dermatologist few months ago with mild improvement  Around 10 years ago, she remembers she had a tendency to creasing her forehead, rolled up her eyes, lasting for few years gradually went away, over the years, she also noticed tendency for moving her mouth and nose, scratching her shoulders, clear her throat,  Mild tendency for obsessive-compulsive, need to organize object in certain ways  One of her son has moist tics, obsessive-compulsive tendency  Update November 16, 2021 EMG guided xeomin injection, used 50 units today  UPDATE June 25th 2024: Lost to follow-up following last injection in November, reported benefit, hope to continue injection, used xeomin 50 units today,  PHYSICAL EXAM:   Vitals:   10/12/22 1608 10/12/22 1622  BP: (!) 175/78 (!) 158/85   Pulse: 79 78  Weight: 200 lb 6.4 oz (90.9 kg)   Height: 5\' 4"  (1.626 m)     Frequent eye blinking, occasionally eye loading, creasing of forehead,    REVIEW OF SYSTEMS:  Full 14 system review of systems performed and notable only for as above All other review of systems were negative.   ALLERGIES: No Known Allergies  HOME MEDICATIONS: Current Outpatient Medications  Medication Sig Dispense Refill   Calcium 500 MG tablet Take 600 mg by mouth 2 (two) times daily.     Cholecalciferol (VITAMIN D PO) Take 2,000 Units by mouth daily.     clobetasol (TEMOVATE) 0.05 % external solution Apply topically.     clotrimazole (LOTRIMIN) 1 % cream Apply twice daily to rash for maximum 14 days. 60 g 0   Cyanocobalamin (VITAMIN B 12 PO) Take by mouth.     losartan-hydrochlorothiazide (HYZAAR) 100-25 MG tablet TAKE ONE TABLET BY MOUTH ONE TIME DAILY 90 tablet 1   metoprolol tartrate (LOPRESSOR) 25 MG tablet TAKE ONE TABLET BY MOUTH ONE TIME DAILY 90 tablet 1   Omega-3 Fatty Acids (FISH OIL) 1000 MG CAPS Take 1,000 mg by mouth daily.     triamcinolone ointment (KENALOG) 0.1 % Apply twice daily for flare ups below neck, maximum 10 days. 80 g 0   TURMERIC PO Take by mouth.     diclofenac (VOLTAREN) 0.1 % ophthalmic solution Place 2 drops into both eyes 4 (four) times daily. (Patient not taking: Reported on 10/12/2022) 5 mL 0   fluticasone (FLONASE) 50 MCG/ACT nasal spray Place  2 sprays into both nostrils daily. (Patient not taking: Reported on 10/12/2022) 16 g 5   Zinc 50 MG TABS Take by mouth.     No current facility-administered medications for this visit.    PAST MEDICAL HISTORY: Past Medical History:  Diagnosis Date   Arthritis    generalized-bilateral hands   Asthma    hx of   Blepharitis of both eyes    Cataract    bilateral-LEFT eye   COPD (chronic obstructive pulmonary disease) (HCC)    not being tx'd at this time (03/23/2020)   Diverticulitis    History of shingles     Hypertension    on meds   Lung nodules    Nodules noted on CT scan July 2017   OSA (obstructive sleep apnea)    Partial small bowel obstruction (HCC)     PAST SURGICAL HISTORY: Past Surgical History:  Procedure Laterality Date   ANKLE SURGERY  2019   CATARACT EXTRACTION W/PHACO Left 06/08/2015   Procedure: CATARACT EXTRACTION PHACO AND INTRAOCULAR LENS PLACEMENT LEFT EYE CDE=7.18;  Surgeon: Susa Simmonds, MD;  Location: [facility];  Service: Ophthalmology;  Laterality: Left;   DILATION AND CURETTAGE OF UTERUS     X 2   EXERCISE TOLERANCE TEST (GXT)  11/2016   Low Exercise Tolerance - 6:24 min (dyspnea, fatigue), upsloping ST segment depression noted.  Nondiagnostic.  HR 150 bpm = 97% MPHR). Hypertensive response to exercise. NEGATIVE test.  No arrhythmias.   right rotator cuff surgery     TRANSTHORACIC ECHOCARDIOGRAM  11/2016   Normal LV size and function.  GR 1 DD.  Moderate concentric hypertrophy.  Vigorous LV function with EF of 65-70%.   WISDOM TOOTH EXTRACTION      FAMILY HISTORY: Family History  Problem Relation Age of Onset   Hypertension Brother    Stroke Maternal Grandmother    Colon polyps Neg Hx    Colon cancer Neg Hx    Esophageal cancer Neg Hx    Rectal cancer Neg Hx    Stomach cancer Neg Hx     SOCIAL HISTORY: Social History   Socioeconomic History   Marital status: Married    Spouse name: Alinda Money   Number of children: 2   Years of education: 13   Highest education level: Not on file  Occupational History   Occupation: Advertising account planner    Comment: Event organiser  Tobacco Use   Smoking status: Never    Passive exposure: Past   Smokeless tobacco: Never  Vaping Use   Vaping status: Never Used  Substance and Sexual Activity   Alcohol use: Yes    Alcohol/week: 14.0 standard drinks of alcohol    Types: 14 Standard drinks or equivalent per week    Comment: Weekly   Drug use: No   Sexual activity: Yes  Other Topics Concern   Not on file  Social  History Narrative   PFT---08-20-2009   fev1-2.04, 92%, fev1/fvc 0.79   Widowed, remarried.  2 kids   Works part time Customer service manager       Lives with husband   Right handed   Caffeine: 2 cups of coffee in the AM   Social Determinants of Health   Financial Resource Strain: Not on file  Food Insecurity: Not on file  Transportation Needs: Not on file  Physical Activity: Sufficiently Active (11/24/2016)   Exercise Vital Sign    Days of Exercise per Week: 5 days    Minutes of Exercise per  Session: 40 min  Stress: Not on file  Social Connections: Not on file  Intimate Partner Violence: Not on file      Levert Feinstein, M.D. Ph.D.  [facility] [address] [place], [place] [address] Ph: [phone] Fax: [phone]  CC:  Donita Brooks, MD [address] [place],  [place] [address]  Donita Brooks, MD

## 2022-10-12 NOTE — Progress Notes (Signed)
xenomin 50units x 1 vial  [id number] [phone] Exp-07.2026 B/B Bacteriostatic 0.9% Sodium Chloride- 1mL  [id number] Expiration: 04.01.2025 NDC: [id number] Dx: G24.5 WITNESSED WC:HENID, RN

## 2022-10-15 MED ORDER — INCOBOTULINUMTOXINA 100 UNITS IM SOLR
50.0000 [IU] | Freq: Once | INTRAMUSCULAR | Status: DC
Start: 2022-10-15 — End: 2022-10-17

## 2022-10-17 DIAGNOSIS — G245 Blepharospasm: Secondary | ICD-10-CM | POA: Diagnosis not present

## 2022-10-17 MED ORDER — INCOBOTULINUMTOXINA 50 UNITS IM SOLR
50.0000 [IU] | INTRAMUSCULAR | Status: DC
Start: 2022-10-17 — End: 2023-04-13
  Administered 2022-10-17: 50 [IU] via INTRAMUSCULAR

## 2022-11-10 ENCOUNTER — Other Ambulatory Visit: Payer: Self-pay | Admitting: Family Medicine

## 2022-11-10 NOTE — Telephone Encounter (Signed)
Requested medications are due for refill today.  yes  Requested medications are on the active medications list.  yes  Last refill. 04/16/2022 Hyzaar, 05/17/2022 for metoprolol  Future visit scheduled.   no  Notes to clinic.  Pt last see for CPE 04/29/2022.    Requested Prescriptions  Pending Prescriptions Disp Refills   losartan-hydrochlorothiazide (HYZAAR) 100-25 MG tablet [Pharmacy Med Name: Losartan Potassium-HCTZ Oral Tablet 100-25 MG] 90 tablet 0    Sig: TAKE ONE TABLET BY MOUTH ONCE A DAY     Cardiovascular: ARB + Diuretic Combos Failed - 11/10/2022  7:35 AM      Failed - K in normal range and within 180 days    Potassium  Date Value Ref Range Status  04/29/2022 4.4 3.5 - 5.3 mmol/L Final         Failed - Na in normal range and within 180 days    Sodium  Date Value Ref Range Status  04/29/2022 134 (L) 135 - 146 mmol/L Final         Failed - Cr in normal range and within 180 days    Creat  Date Value Ref Range Status  04/29/2022 0.62 0.60 - 1.00 mg/dL Final         Failed - eGFR is 10 or above and within 180 days    GFR, Est African American  Date Value Ref Range Status  03/30/2020 105 > OR = 60 mL/min/1.30m2 Final   GFR, Est Non African American  Date Value Ref Range Status  03/30/2020 91 > OR = 60 mL/min/1.13m2 Final   GFR, Estimated  Date Value Ref Range Status  08/22/2020 >60 >60 mL/min Final    Comment:    (NOTE) Calculated using the CKD-EPI Creatinine Equation (2021)    GFR  Date Value Ref Range Status  04/08/2011 90.87 >60.00 mL/min Final   eGFR  Date Value Ref Range Status  04/29/2022 93 > OR = 60 mL/min/1.68m2 Final         Failed - Last BP in normal range    BP Readings from Last 1 Encounters:  10/12/22 (!) 158/85         Failed - Valid encounter within last 6 months    Recent Outpatient Visits           1 year ago General medical exam   [facility] Donita Brooks, MD   1 year ago Syncope, unspecified syncope  type   [facility] Donita Brooks, MD   2 years ago Screening cholesterol level   [facility] Pickard, Priscille Heidelberg, MD   3 years ago General medical exam   [facility] Donita Brooks, MD   4 years ago Screening cholesterol level   [facility] Pickard, Priscille Heidelberg, MD       Future Appointments             In 1 week Birder Robson, MD [facility] - Patient is not pregnant       metoprolol tartrate (LOPRESSOR) 25 MG tablet [Pharmacy Med Name: Metoprolol Tartrate Oral Tablet 25 MG] 90 tablet 0    Sig: TAKE ONE TABLET BY MOUTH ONCE A DAY     Cardiovascular:  Beta Blockers Failed - 11/10/2022  7:35 AM      Failed - Last BP in  normal range    BP Readings from Last 1 Encounters:  10/12/22 (!) 158/85         Failed - Valid encounter within last 6 months    Recent Outpatient Visits           1 year ago General medical exam   [facility] Donita Brooks, MD   1 year ago Syncope, unspecified syncope type   [facility] Donita Brooks, MD   2 years ago Screening cholesterol level   [facility] Pickard, Priscille Heidelberg, MD   3 years ago General medical exam   [facility] Donita Brooks, MD   4 years ago Screening cholesterol level   [facility] Pickard, Priscille Heidelberg, MD       Future Appointments             In 1 week Birder Robson, MD [facility] - Last Heart Rate in normal range    Pulse Readings from Last 1 Encounters:  10/12/22 78

## 2022-11-14 NOTE — Telephone Encounter (Signed)
Copied from CRM [id number]. Topic: Clinical - Medication Refill >> Nov 14, 2022  3:30 PM Almira Coaster wrote: Most Recent Primary Care Visit:  Provider: Lynnea Ferrier T  Department: [facility]  Visit Type: OFFICE VISIT  Date: 07/25/2022  Medication: metoprolol tartrate (LOPRESSOR) 25 MG tablet & losartan-hydrochlorothiazide (HYZAAR) 100-25 MG tablet  Has the patient contacted their pharmacy? Yes (Agent: If no, request that the patient contact the pharmacy for the refill. If patient does not wish to contact the pharmacy document the reason why and proceed with request.) (Agent: If yes, when and what did the pharmacy advise?)  Is this the correct pharmacy for this prescription? Yes If no, delete pharmacy and type the correct one.  This is the patient's preferred pharmacy:  [facility] # [address] [place] - [address] [place] [address] Phone: [phone] Fax: [phone]   Has the prescription been filled recently? No  Is the patient out of the medication? No, patient has enough for today and tomorrow.   Has the patient been seen for an appointment in the last year OR does the patient have an upcoming appointment? No  Can we respond through MyChart? No, patient prefers a phone   Agent: Please be advised that Rx refills may take up to 3 business days. We ask that you follow-up with your pharmacy.

## 2022-11-21 ENCOUNTER — Ambulatory Visit: Payer: Medicare Other | Admitting: Internal Medicine

## 2022-11-22 ENCOUNTER — Ambulatory Visit
Admission: RE | Admit: 2022-11-22 | Discharge: 2022-11-22 | Disposition: A | Discharge status: Home / Self Care | Payer: Medicare Other | Source: Ambulatory Visit | Attending: Family Medicine | Admitting: Family Medicine

## 2022-11-22 DIAGNOSIS — Z780 Asymptomatic menopausal state: Secondary | ICD-10-CM

## 2022-11-24 ENCOUNTER — Telehealth: Payer: Self-pay

## 2022-11-24 NOTE — Telephone Encounter (Signed)
Copied from CRM [id number]. Topic: General - Other >> Nov 24, 2022  9:47 AM Almira Coaster wrote: Reason for CRM: Patient states she received a call back to return the call to the clinic; however, I did not see any notes in the system. Patient is available for a call back anytime after 4pm at phone number: [phone].

## 2022-11-28 ENCOUNTER — Other Ambulatory Visit: Payer: Self-pay | Admitting: Family Medicine

## 2022-11-28 NOTE — Telephone Encounter (Signed)
Copied from CRM [id number]. Topic: Clinical - Medication Refill >> Nov 28, 2022  3:41 PM Lorin Glass B wrote: Most Recent Primary Care Visit:  Provider: Lynnea Ferrier T  Department: [facility]  Visit Type: OFFICE VISIT  Date: 07/25/2022  Medication: ***  Has the patient contacted their pharmacy?  (Agent: If no, request that the patient contact the pharmacy for the refill. If patient does not wish to contact the pharmacy document the reason why and proceed with request.) (Agent: If yes, when and what did the pharmacy advise?)  Is this the correct pharmacy for this prescription?  If no, delete pharmacy and type the correct one.  This is the patient's preferred pharmacy:  [facility] # [address], [place] - [address] [place] [address] Phone: [phone] Fax: [phone]   Has the prescription been filled recently?   Is the patient out of the medication?   Has the patient been seen for an appointment in the last year OR does the patient have an upcoming appointment?   Can we respond through MyChart?   Agent: Please be advised that Rx refills may take up to 3 business days. We ask that you follow-up with your pharmacy.

## 2022-11-30 ENCOUNTER — Telehealth: Payer: Self-pay

## 2022-11-30 ENCOUNTER — Other Ambulatory Visit: Payer: Self-pay

## 2022-11-30 DIAGNOSIS — H5713 Ocular pain, bilateral: Secondary | ICD-10-CM

## 2022-11-30 MED ORDER — DICLOFENAC SODIUM 0.1 % OP SOLN: 2.0000 [drp] | Freq: Four times a day (QID) | OPHTHALMIC | 0 refills | Status: DC

## 2022-11-30 NOTE — Telephone Encounter (Signed)
Copied from CRM [id number]. Topic: Clinical - Medication Question >> Nov 30, 2022 10:21 AM Almira Coaster wrote: Reason for CRM: Patient would like to speak with someone from the office regarding a refill request for diclofenac (VOLTAREN) 0.1 % ophthalmic solution she's been asking for, She states she went to [facility] yesterday and they told her they have not received the prescription. She is upset and her best contact number is

## 2022-12-26 ENCOUNTER — Telehealth: Payer: Self-pay | Admitting: Neurology

## 2022-12-26 NOTE — Telephone Encounter (Signed)
Needs to speak to PCP. Wegovy and Ozempic all are meds that have antiinflammatory potential , they reduce apnea.  I will not prescribe this medication but her PCP can. CD

## 2022-12-26 NOTE — Telephone Encounter (Signed)
Pt states she saw a medication("Wegovy") on TV that helps with weight loss in people with sleep apnea and was wondering if that is something that may work for her. Requesting call back to discuss

## 2022-12-27 NOTE — Telephone Encounter (Signed)
Called and spoke to pt and relayed the following information from neurologist:  Dohmeier, Porfirio Mylar, MD14 hours ago (5:20 PM)   CD Needs to speak to PCP. Wegovy and Ozempic all are meds that have antiinflammatory potential , they reduce apnea.  I will not prescribe this medication but her PCP can. CD     Pt asked if I would send this to Dr. Tanya Nones to see if he would do a hemoglobin A1c check and see if the glp 1 would be something pcp would prescribe. Routing to pcp.

## 2022-12-29 ENCOUNTER — Other Ambulatory Visit: Payer: Self-pay | Admitting: Family Medicine

## 2022-12-29 MED ORDER — WEGOVY 0.5 MG/0.5ML ~~LOC~~ SOAJ: 0.5000 mg | SUBCUTANEOUS | 1 refills | Status: DC

## 2022-12-30 ENCOUNTER — Other Ambulatory Visit: Payer: Self-pay | Admitting: Family Medicine

## 2022-12-30 MED ORDER — DICLOFENAC SODIUM 75 MG PO TBEC: 75.0000 mg | DELAYED_RELEASE_TABLET | Freq: Two times a day (BID) | ORAL | 1 refills | Status: DC | PRN

## 2023-01-05 ENCOUNTER — Telehealth: Payer: Self-pay | Admitting: Neurology

## 2023-01-05 NOTE — Telephone Encounter (Signed)
 Pt LVM to confirm upcoming appt. Called back and gave appt details

## 2023-01-11 DIAGNOSIS — Z6834 Body mass index (BMI) 34.0-34.9, adult: Secondary | ICD-10-CM | POA: Insufficient documentation

## 2023-01-18 ENCOUNTER — Ambulatory Visit: Payer: Medicare Other | Admitting: Neurology

## 2023-01-18 ENCOUNTER — Encounter: Payer: Self-pay | Admitting: Neurology

## 2023-01-18 VITALS — BP 128/74 | Ht 64.0 in | Wt 203.0 lb

## 2023-01-18 DIAGNOSIS — G2581 Restless legs syndrome: Secondary | ICD-10-CM | POA: Diagnosis not present

## 2023-01-18 DIAGNOSIS — G245 Blepharospasm: Secondary | ICD-10-CM | POA: Diagnosis not present

## 2023-01-18 DIAGNOSIS — G5139 Clonic hemifacial spasm, unspecified: Secondary | ICD-10-CM | POA: Diagnosis not present

## 2023-01-18 MED ORDER — INCOBOTULINUMTOXINA 50 UNITS IM SOLR
50.0000 [IU] | INTRAMUSCULAR | Status: DC
  Administered 2023-01-29: 50 [IU] via INTRAMUSCULAR

## 2023-01-18 MED ORDER — GABAPENTIN 300 MG PO CAPS: 900.0000 mg | ORAL_CAPSULE | Freq: Every evening | ORAL | 11 refills | Status: DC | PRN

## 2023-01-18 NOTE — Progress Notes (Signed)
   Chief Complaint  Patient presents with   xeomin    Rm 14, xeomin patient well and ready for injection       ASSESSMENT AND PLAN  Lynn Estes is a 78 y.o. female   Blepharospasm  EMG guided xeomin injection use 50 units, first injection through our office November 16, 2021   Responded very well to previous injection  Repeat injection today, Used xeomin 50 units (2.5 units at each injection site)

## 2023-01-18 NOTE — Progress Notes (Signed)
xeomin 50 units x 1 vial  [id number] [id number] Exp-01/2025 B/B OR s/p Bacteriostatic 0.9% Sodium Chloride- 1mL  [id number] Expiration: 11/04/2023 NDC: 7829-5621-30 Dx: G24.5, G51.39 B/B WITNESSED BY: Duffy Bruce CMA

## 2023-01-26 ENCOUNTER — Telehealth: Payer: Self-pay | Admitting: Neurology

## 2023-01-26 NOTE — Telephone Encounter (Signed)
Pt returned call to April, RN

## 2023-01-26 NOTE — Telephone Encounter (Signed)
Pt called stating that her R eye has been drooping for the last three days. Pt would like to talk to the nurse to see if it has to do with the Xeomin shot. Please advise.

## 2023-01-26 NOTE — Telephone Encounter (Signed)
Call to patient, advised that eyelid drop was a side effect of xeomin. Advised patient she could gently massage area with clean hand but it may last 3 months. Patient verbalized understanding.

## 2023-01-26 NOTE — Telephone Encounter (Signed)
Call to patient, no answer. Left message to return call.

## 2023-01-29 DIAGNOSIS — G245 Blepharospasm: Secondary | ICD-10-CM | POA: Diagnosis not present

## 2023-02-07 ENCOUNTER — Encounter (HOSPITAL_COMMUNITY): Payer: Self-pay | Admitting: Emergency Medicine

## 2023-02-07 ENCOUNTER — Inpatient Hospital Stay (HOSPITAL_COMMUNITY)
Admission: EM | Admit: 2023-02-07 | Discharge: 2023-02-09 | DRG: 281 | Disposition: A | Discharge status: Home / Self Care | Payer: Medicare Other | Attending: Internal Medicine | Admitting: Internal Medicine

## 2023-02-07 ENCOUNTER — Emergency Department (HOSPITAL_COMMUNITY): Payer: Medicare Other

## 2023-02-07 ENCOUNTER — Other Ambulatory Visit: Payer: Self-pay

## 2023-02-07 DIAGNOSIS — E785 Hyperlipidemia, unspecified: Secondary | ICD-10-CM | POA: Diagnosis present

## 2023-02-07 DIAGNOSIS — E66811 Obesity, class 1: Secondary | ICD-10-CM | POA: Diagnosis present

## 2023-02-07 DIAGNOSIS — Z823 Family history of stroke: Secondary | ICD-10-CM

## 2023-02-07 DIAGNOSIS — I2510 Atherosclerotic heart disease of native coronary artery without angina pectoris: Secondary | ICD-10-CM | POA: Diagnosis present

## 2023-02-07 DIAGNOSIS — I214 Non-ST elevation (NSTEMI) myocardial infarction: Principal | ICD-10-CM | POA: Diagnosis present

## 2023-02-07 DIAGNOSIS — G4733 Obstructive sleep apnea (adult) (pediatric): Secondary | ICD-10-CM | POA: Diagnosis present

## 2023-02-07 DIAGNOSIS — Z79899 Other long term (current) drug therapy: Secondary | ICD-10-CM

## 2023-02-07 DIAGNOSIS — I10 Essential (primary) hypertension: Secondary | ICD-10-CM | POA: Diagnosis present

## 2023-02-07 DIAGNOSIS — E871 Hypo-osmolality and hyponatremia: Secondary | ICD-10-CM | POA: Diagnosis present

## 2023-02-07 DIAGNOSIS — Z7985 Long-term (current) use of injectable non-insulin antidiabetic drugs: Secondary | ICD-10-CM

## 2023-02-07 DIAGNOSIS — I160 Hypertensive urgency: Secondary | ICD-10-CM | POA: Insufficient documentation

## 2023-02-07 DIAGNOSIS — Z6834 Body mass index (BMI) 34.0-34.9, adult: Secondary | ICD-10-CM

## 2023-02-07 DIAGNOSIS — Z8249 Family history of ischemic heart disease and other diseases of the circulatory system: Secondary | ICD-10-CM

## 2023-02-07 DIAGNOSIS — J4489 Other specified chronic obstructive pulmonary disease: Secondary | ICD-10-CM | POA: Diagnosis present

## 2023-02-07 LAB — BASIC METABOLIC PANEL WITH GFR
Anion gap: 15 (ref 5–15)
BUN: 15 mg/dL (ref 8–23)
CO2: 25 mmol/L (ref 22–32)
Calcium: 9.3 mg/dL (ref 8.9–10.3)
Chloride: 97 mmol/L — ABNORMAL LOW (ref 98–111)
Creatinine, Ser: 0.66 mg/dL (ref 0.44–1.00)
GFR, Estimated: >60 mL/min
Glucose, Bld: 85 mg/dL (ref 70–99)
Potassium: 4.3 mmol/L (ref 3.5–5.1)
Sodium: 137 mmol/L (ref 135–145)

## 2023-02-07 LAB — CBC
HCT: 40.8 % (ref 36.0–46.0)
Hemoglobin: 13.9 g/dL (ref 12.0–15.0)
MCH: 30.5 pg (ref 26.0–34.0)
MCHC: 34.1 g/dL (ref 30.0–36.0)
MCV: 89.5 fL (ref 80.0–100.0)
Platelets: 329 10*3/uL (ref 150–400)
RBC: 4.56 MIL/uL (ref 3.87–5.11)
RDW: 12.8 % (ref 11.5–15.5)
WBC: 10.4 10*3/uL (ref 4.0–10.5)
nRBC: 0 % (ref 0.0–0.2)

## 2023-02-07 LAB — TROPONIN I (HIGH SENSITIVITY): Troponin I (High Sensitivity): 48 ng/L — ABNORMAL HIGH (ref <18)

## 2023-02-07 NOTE — ED Provider Triage Note (Signed)
 Emergency Medicine Provider Triage Evaluation Note  Lynn Estes , a 78 y.o. female  was evaluated in triage.  Pt complains of chest pain.  She reports that she began experience chest pain and shortness of breath about 45 minutes after dinner.  States that she had notable hypertension and was tachycardic at that time.  She felt slightly sweaty but denies any nausea or vomiting.  Describes the pain as under the right breast.  She currently is pain-free.  Review of Systems  Positive: As above Negative: As above  Physical Exam  BP (!) 166/84   Pulse 95   Temp 98.2 F (36.8 C) (Oral)   Resp 17   Ht 5' 4 (1.626 m)   Wt 90.3 kg   SpO2 96%   BMI 34.16 kg/m  Gen:   Awake, no distress   Resp:  Normal effort, CTAB MSK:   Moves extremities without difficulty  Other:  No focal abdominal tenderness, no reproducible chest wall tenderness  Medical Decision Making  Medically screening exam initiated at 11:11 PM.  Appropriate orders placed.  Lynn Estes was informed that the remainder of the evaluation will be completed by another provider, this initial triage assessment does not replace that evaluation, and the importance of remaining in the ED until their evaluation is complete.     Lynn Estes A, PA-C 02/07/23 2313

## 2023-02-07 NOTE — ED Triage Notes (Signed)
Patient reports episode of chest pain and shortness of breath lasting approx 45 minutes. BP was elevated at 192/92 and pulse 115 at home. Pain under right breast and described as stabbing. Patient states she is currently pain free.

## 2023-02-08 ENCOUNTER — Inpatient Hospital Stay (HOSPITAL_COMMUNITY): Payer: Medicare Other

## 2023-02-08 DIAGNOSIS — Z8249 Family history of ischemic heart disease and other diseases of the circulatory system: Secondary | ICD-10-CM | POA: Diagnosis not present

## 2023-02-08 DIAGNOSIS — E871 Hypo-osmolality and hyponatremia: Secondary | ICD-10-CM | POA: Diagnosis present

## 2023-02-08 DIAGNOSIS — J4489 Other specified chronic obstructive pulmonary disease: Secondary | ICD-10-CM | POA: Diagnosis present

## 2023-02-08 DIAGNOSIS — I10 Essential (primary) hypertension: Secondary | ICD-10-CM | POA: Diagnosis present

## 2023-02-08 DIAGNOSIS — E66811 Obesity, class 1: Secondary | ICD-10-CM | POA: Diagnosis present

## 2023-02-08 DIAGNOSIS — Z823 Family history of stroke: Secondary | ICD-10-CM | POA: Diagnosis not present

## 2023-02-08 DIAGNOSIS — I214 Non-ST elevation (NSTEMI) myocardial infarction: Secondary | ICD-10-CM

## 2023-02-08 DIAGNOSIS — G4733 Obstructive sleep apnea (adult) (pediatric): Secondary | ICD-10-CM | POA: Diagnosis present

## 2023-02-08 DIAGNOSIS — Z79899 Other long term (current) drug therapy: Secondary | ICD-10-CM | POA: Diagnosis not present

## 2023-02-08 DIAGNOSIS — I2510 Atherosclerotic heart disease of native coronary artery without angina pectoris: Secondary | ICD-10-CM | POA: Diagnosis present

## 2023-02-08 DIAGNOSIS — I160 Hypertensive urgency: Secondary | ICD-10-CM | POA: Diagnosis present

## 2023-02-08 DIAGNOSIS — Z7985 Long-term (current) use of injectable non-insulin antidiabetic drugs: Secondary | ICD-10-CM | POA: Diagnosis not present

## 2023-02-08 DIAGNOSIS — E785 Hyperlipidemia, unspecified: Secondary | ICD-10-CM | POA: Diagnosis present

## 2023-02-08 DIAGNOSIS — Z6834 Body mass index (BMI) 34.0-34.9, adult: Secondary | ICD-10-CM | POA: Diagnosis not present

## 2023-02-08 LAB — BASIC METABOLIC PANEL
Anion gap: 12 (ref 5–15)
BUN: 15 mg/dL (ref 8–23)
CO2: 19 mmol/L — ABNORMAL LOW (ref 22–32)
Calcium: 8.6 mg/dL — ABNORMAL LOW (ref 8.9–10.3)
Chloride: 98 mmol/L (ref 98–111)
Creatinine, Ser: 0.56 mg/dL (ref 0.44–1.00)
GFR, Estimated: >60 mL/min (ref >60)
Glucose, Bld: 109 mg/dL — ABNORMAL HIGH (ref 70–99)
Potassium: 4 mmol/L (ref 3.5–5.1)
Sodium: 129 mmol/L — ABNORMAL LOW (ref 135–145)

## 2023-02-08 LAB — ECHOCARDIOGRAM COMPLETE
AR max vel: 3.21 cm2
AV Area VTI: 3.61 cm2
AV Area mean vel: 2.84 cm2
AV Mean grad: 5 mm[Hg]
AV Peak grad: 7.2 mm[Hg]
Ao pk vel: 1.34 m/s
Area-P 1/2: 2.52 cm2
Height: 64 in
MV VTI: 3.29 cm2
S' Lateral: 2.2 cm
Single Plane A4C EF: 68.4 %
Weight: 3184 [oz_av]

## 2023-02-08 LAB — HEMOGLOBIN A1C
Hgb A1c MFr Bld: 5.6 % (ref 4.8–5.6)
Mean Plasma Glucose: 114.02 mg/dL

## 2023-02-08 LAB — HEPATIC FUNCTION PANEL
ALT: 25 U/L (ref 0–44)
AST: 30 U/L (ref 15–41)
Albumin: 3.6 g/dL (ref 3.5–5.0)
Alkaline Phosphatase: 62 U/L (ref 38–126)
Bilirubin, Direct: 0.4 mg/dL — ABNORMAL HIGH (ref 0.0–0.2)
Indirect Bilirubin: 0.5 mg/dL (ref 0.3–0.9)
Total Bilirubin: 0.9 mg/dL (ref 0.0–1.2)
Total Protein: 6.2 g/dL — ABNORMAL LOW (ref 6.5–8.1)

## 2023-02-08 LAB — LIPID PANEL
Cholesterol: 164 mg/dL (ref 0–200)
HDL: 62 mg/dL (ref >40)
LDL Cholesterol: 82 mg/dL (ref 0–99)
Total CHOL/HDL Ratio: 2.6 {ratio}
Triglycerides: 100 mg/dL (ref <150)
VLDL: 20 mg/dL (ref 0–40)

## 2023-02-08 LAB — PHOSPHORUS: Phosphorus: 3.3 mg/dL (ref 2.5–4.6)

## 2023-02-08 LAB — BRAIN NATRIURETIC PEPTIDE: B Natriuretic Peptide: 64.3 pg/mL (ref 0.0–100.0)

## 2023-02-08 LAB — LIPASE, BLOOD: Lipase: 26 U/L (ref 11–51)

## 2023-02-08 LAB — HEPARIN LEVEL (UNFRACTIONATED): Heparin Unfractionated: <0.1 [IU]/mL — ABNORMAL LOW (ref 0.30–0.70)

## 2023-02-08 LAB — MAGNESIUM: Magnesium: 2 mg/dL (ref 1.7–2.4)

## 2023-02-08 LAB — TROPONIN I (HIGH SENSITIVITY)
Troponin I (High Sensitivity): 132 ng/L (ref <18)
Troponin I (High Sensitivity): 165 ng/L (ref <18)

## 2023-02-08 LAB — PROTIME-INR
INR: 1 (ref 0.8–1.2)
Prothrombin Time: 13.6 s (ref 11.4–15.2)

## 2023-02-08 MED ORDER — NITROGLYCERIN 2 % TD OINT
1.0000 [in_us] | TOPICAL_OINTMENT | Freq: Once | TRANSDERMAL | Status: AC
  Administered 2023-02-08: 1 [in_us] via TOPICAL
  Filled 2023-02-08: qty 1

## 2023-02-08 MED ORDER — ATORVASTATIN CALCIUM 40 MG PO TABS
40.0000 mg | ORAL_TABLET | Freq: Once | ORAL | Status: AC
  Administered 2023-02-08: 40 mg via ORAL
  Filled 2023-02-08: qty 1

## 2023-02-08 MED ORDER — SODIUM CHLORIDE 0.9% FLUSH
3.0000 mL | Freq: Two times a day (BID) | INTRAVENOUS | Status: DC
  Administered 2023-02-08 – 2023-02-09 (×3): 3 mL via INTRAVENOUS

## 2023-02-08 MED ORDER — ASPIRIN 81 MG PO CHEW
162.0000 mg | CHEWABLE_TABLET | Freq: Once | ORAL | Status: AC
  Administered 2023-02-08: 162 mg via ORAL
  Filled 2023-02-08: qty 2

## 2023-02-08 MED ORDER — CARVEDILOL 3.125 MG PO TABS: 3.1250 mg | ORAL_TABLET | Freq: Two times a day (BID) | ORAL | Status: DC

## 2023-02-08 MED ORDER — ALBUTEROL SULFATE (2.5 MG/3ML) 0.083% IN NEBU: 2.5000 mg | INHALATION_SOLUTION | RESPIRATORY_TRACT | Status: DC | PRN

## 2023-02-08 MED ORDER — ASPIRIN 81 MG PO CHEW
81.0000 mg | CHEWABLE_TABLET | Freq: Once | ORAL | Status: AC
  Administered 2023-02-08: 81 mg via ORAL
  Filled 2023-02-08: qty 1

## 2023-02-08 MED ORDER — PSYLLIUM 95 % PO PACK
1.0000 | PACK | Freq: Every day | ORAL | Status: DC
  Filled 2023-02-08 (×2): qty 1

## 2023-02-08 MED ORDER — AMLODIPINE BESYLATE 10 MG PO TABS
10.0000 mg | ORAL_TABLET | Freq: Every day | ORAL | Status: DC
  Administered 2023-02-08: 10 mg via ORAL
  Filled 2023-02-08: qty 2

## 2023-02-08 MED ORDER — LABETALOL HCL 5 MG/ML IV SOLN: 10.0000 mg | INTRAVENOUS | Status: DC | PRN

## 2023-02-08 MED ORDER — PERFLUTREN LIPID MICROSPHERE
1.0000 mL | INTRAVENOUS | Status: AC | PRN
  Administered 2023-02-08: 3 mL via INTRAVENOUS

## 2023-02-08 MED ORDER — HEPARIN (PORCINE) 25000 UT/250ML-% IV SOLN
1600.0000 [IU]/h | INTRAVENOUS | Status: DC
  Administered 2023-02-08: 1200 [IU]/h via INTRAVENOUS
  Filled 2023-02-08: qty 250

## 2023-02-08 MED ORDER — PROCHLORPERAZINE EDISYLATE 10 MG/2ML IJ SOLN
10.0000 mg | Freq: Four times a day (QID) | INTRAMUSCULAR | Status: DC | PRN
  Administered 2023-02-08 – 2023-02-09 (×2): 10 mg via INTRAVENOUS
  Filled 2023-02-08 (×2): qty 2

## 2023-02-08 MED ORDER — METOPROLOL SUCCINATE ER 25 MG PO TB24
25.0000 mg | ORAL_TABLET | Freq: Every day | ORAL | Status: DC
  Administered 2023-02-08 – 2023-02-09 (×2): 25 mg via ORAL
  Filled 2023-02-08 (×3): qty 1

## 2023-02-08 MED ORDER — HEPARIN BOLUS VIA INFUSION
4000.0000 [IU] | Freq: Once | INTRAVENOUS | Status: AC
Start: 2023-02-08 — End: 2023-02-08
  Administered 2023-02-08: 4000 [IU] via INTRAVENOUS
  Filled 2023-02-08: qty 4000

## 2023-02-08 MED ORDER — HEPARIN (PORCINE) 25000 UT/250ML-% IV SOLN
900.0000 [IU]/h | INTRAVENOUS | Status: DC
  Administered 2023-02-08: 900 [IU]/h via INTRAVENOUS
  Filled 2023-02-08: qty 250

## 2023-02-08 MED ORDER — LOSARTAN POTASSIUM 50 MG PO TABS
75.0000 mg | ORAL_TABLET | Freq: Every day | ORAL | Status: DC
  Administered 2023-02-08: 75 mg via ORAL
  Filled 2023-02-08: qty 2

## 2023-02-08 NOTE — ED Notes (Signed)
 Heparin  sample hemoliyzed. New sample drawn and sent.

## 2023-02-08 NOTE — Progress Notes (Signed)
 PHARMACY - ANTICOAGULATION CONSULT NOTE  Pharmacy Consult for IV heparin  Indication: chest pain/ACS  No Known Allergies  Patient Measurements: Height: 5' 4 (162.6 cm) Weight: 90.3 kg (199 lb) IBW/kg (Calculated) : 54.7 Heparin  Dosing Weight: 74.9 kg  Vital Signs: Temp: 98.4 F (36.9 C) (02/05 1223) Temp Source: Oral (02/05 1223) BP: 162/80 (02/05 1145) Pulse Rate: 75 (02/05 1145)  Labs: Recent Labs    02/07/23 2233 02/08/23 0044 02/08/23 0502 02/08/23 0839 02/08/23 1207  HGB 13.9  --   --   --   --   HCT 40.8  --   --   --   --   PLT 329  --   --   --   --   LABPROT  --   --  13.6  --   --   INR  --   --  1.0  --   --   HEPARINUNFRC  --   --   --   --  <0.10*  CREATININE 0.66  --  0.56  --   --   TROPONINIHS 48* 132*  --  165*  --     Estimated Creatinine Clearance: 65.1 mL/min (by C-G formula based on SCr of 0.56 mg/dL).   Medical History: Past Medical History:  Diagnosis Date   Arthritis    generalized-bilateral hands   Asthma    hx of   Blepharitis of both eyes    Cataract    bilateral-LEFT eye   COPD (chronic obstructive pulmonary disease) (HCC)    not being tx'd at this time (03/23/2020)   Diverticulitis    History of shingles    Hypertension    on meds   Lung nodules    Nodules noted on CT scan July 2017   OSA (obstructive sleep apnea)    Partial small bowel obstruction (HCC)     Assessment: Lynn Estes is a 78 y.o. year old female admitted on 02/07/2023 with concern for ACS. No anticoagulation prior to admission. Pharmacy consulted to dose heparin .  Initial heparin  level undetectable at 900 units/hr, no issues with infusion or overt s/sx of bleeding per RN.   Goal of Therapy:  Heparin  level 0.3-0.7 units/ml Monitor platelets by anticoagulation protocol: Yes   Plan:  Increase heparin  infusion to  1200 units/hr Daily heparin  level, CBC, and monitoring for bleeding F/u plans for anticoagulation and cards recs  Koren Or,  PharmD Clinical Pharmacist 02/08/2023 1:01 PM Please check AMION for all [facility] Pharmacy numbers

## 2023-02-08 NOTE — ED Notes (Signed)
 Called and placed PT on CCMD monitor

## 2023-02-08 NOTE — Progress Notes (Signed)
 Patient seen and examined; admitted after midnight secondary to chest pain.  Patient with history of obstructive sleep apnea, COPD/asthma, hypertension, dyslipidemia, class I obesity and gastroesophageal reflux disease.  Who reported chest pain with associated dyspnea on exertion; patient took aspirin  with some relief and was found with elevated troponin.  Heparin  drip has been initiated; chest x-ray without cardiopulmonary process.  Cardiology service consulted for further assistance regarding acute coronary syndrome evaluation and management.  Patient is n.p.o. with anticipated cardiac cath.  Please refer to H&P written by Dr. Keturah 02/08/23.  Plan: -Keep patient n.p.o. -Follow cardiology service recommendation -Follow 2D echo results -Continue heparin  drip, aspirin , beta-blocker. -As needed analgesics will be provided. -Continue telemetry monitoring.  Eric Nunnery MD [phone]

## 2023-02-08 NOTE — Consult Note (Signed)
 Cardiology Consultation:   Patient ID: Rickey Farrier; [id number]; 1945-05-22   Admit date: 02/07/2023 Date of Consult: 02/08/2023  Primary Care Provider: Duanne Butler DASEN, MD Primary Cardiologist: Dr. Anner, MD  Patient Profile:   Lynn Estes is a 78 y.o. female with a hx of OSA, COPD, asthma, and HTN who is being seen today for the evaluation of chest pain at the request of Dr. Keturah.  History of Present Illness:   Lynn Estes is a 78yo F with a hx as stated above who presented to [facility] after an episode of chest pain. She reports she was leaving dinner yesterday evening and noted worsening DOE while walking to her car. After getting home she had an episode of central chest pain with radiation to her right side with associated nausea, diaphoresis, and mild dizziness with no syncope. She took her BP which was elevated above her baseline in the 180's and  took a baby aspirin  with relief. She had her husband bring her to the ED for further evaluation.    In the ED HsT found to be elevated at 48>>132>>165. Na+ 126. EKG with NSR, HR 86bpm and no acute ST/T wave changes. Evidence of moderate LVH. BP at 186/88>166/84. Given troponin bump, IV Heparin  was initiated with cardiology consultation. Echocardiogram ordered however not yet obtained. Since arrival she has had no further chest pain however upon discussion she reports several prior episodes of central chest pressure with no radiation. She reports taking ASA 81mg  and the pressure would go away therefore she has not sought further medical attention. She has a chronic cough (>2year) and was previously follow with OP pulmonary for lung nodules although she states subsequent imaging showed stable sizing therefore no further imaging was required. Otherwise she denies palpitations, LE edema, orthopnea, or syncope. No recent illness with fever or chills. Denies prior tobacco use however has been around second had smoke most of her life.     Lynn Estes was previously referred to our team and seen by Dr. Anner for syncope, chest pain, and palpitations in 2018. Full cardiac workup with an echocardiogram and exercise treadmill stress test where normal with no evidence of ischemia. She was placed on beta blocker therapy and antihypertensives were adjusted. She was lost to follow up for 4 years and was seen in the outpatient setting for syncope 11/2020 felt to be vasovagal in etiology although a 3-day monitor was placed. This showed no significant arrhythmia. She has not been seen since this time.   Past Medical History:  Diagnosis Date   Arthritis    generalized-bilateral hands   Asthma    hx of   Blepharitis of both eyes    Cataract    bilateral-LEFT eye   COPD (chronic obstructive pulmonary disease) (HCC)    not being tx'd at this time (03/23/2020)   Diverticulitis    History of shingles    Hypertension    on meds   Lung nodules    Nodules noted on CT scan July 2017   OSA (obstructive sleep apnea)    Partial small bowel obstruction [facility])     Past Surgical History:  Procedure Laterality Date   ANKLE SURGERY  2019   CATARACT EXTRACTION W/PHACO Left 06/08/2015   Procedure: CATARACT EXTRACTION PHACO AND INTRAOCULAR LENS PLACEMENT LEFT EYE CDE=7.18;  Surgeon: Dow JULIANNA Burke, MD;  Location: [facility];  Service: Ophthalmology;  Laterality: Left;   DILATION AND CURETTAGE OF UTERUS     X 2  EXERCISE TOLERANCE TEST (GXT)  11/2016   Low Exercise Tolerance - 6:24 min (dyspnea, fatigue), upsloping ST segment depression noted.  Nondiagnostic.  HR 150 bpm = 97% MPHR). Hypertensive response to exercise. NEGATIVE test.  No arrhythmias.   right rotator cuff surgery     TRANSTHORACIC ECHOCARDIOGRAM  11/2016   Normal LV size and function.  GR 1 DD.  Moderate concentric hypertrophy.  Vigorous LV function with EF of 65-70%.   WISDOM TOOTH EXTRACTION       Prior to Admission medications   Medication Sig Start Date End Date  Taking? Authorizing Provider  calcium  carbonate (OS-CAL) 600 MG tablet Take 600 mg by mouth daily.   Yes [provider]  Cholecalciferol (VITAMIN D  PO) Take 2,000 Units by mouth daily.   Yes [provider]  clobetasol (TEMOVATE) 0.05 % external solution Apply 1 Application topically daily. 10/30/20  Yes [provider]  clotrimazole  (LOTRIMIN ) 1 % cream Apply twice daily to rash for maximum 14 days. Patient taking differently: Apply 1 Application topically daily as needed (for rash). 08/15/22  Yes Tobie Arleta SQUIBB, MD  Cyanocobalamin (VITAMIN B 12 PO) Take 1 tablet by mouth daily.   Yes [provider]  diclofenac  (VOLTAREN ) 75 MG EC tablet Take 1 tablet (75 mg total) by mouth 2 (two) times daily as needed (joint pain). 12/30/22  Yes Duanne Butler DASEN, MD  fluticasone  (FLONASE ) 50 MCG/ACT nasal spray Place 2 sprays into both nostrils daily. Patient taking differently: Place 2 sprays into both nostrils daily as needed (for congestion). 08/15/22  Yes Tobie Arleta SQUIBB, MD  losartan -hydrochlorothiazide  (HYZAAR) 100-25 MG tablet TAKE ONE TABLET BY MOUTH ONCE A DAY 11/14/22  Yes Duanne Butler DASEN, MD  metoprolol  tartrate (LOPRESSOR ) 25 MG tablet TAKE ONE TABLET BY MOUTH ONCE A DAY 11/14/22  Yes Duanne Butler DASEN, MD  Omega-3 Fatty Acids (FISH OIL) 1000 MG CAPS Take 1,000 mg by mouth daily.   Yes [provider]  TURMERIC PO Take 1 capsule by mouth daily.   Yes [provider]  gabapentin  (NEURONTIN ) 300 MG capsule Take 3 capsules (900 mg total) by mouth at bedtime as needed. Patient not taking: Reported on 02/08/2023 01/18/23   Onita Duos, MD    Inpatient Medications: Scheduled Meds:  incobotulinumtoxinA   50 Units Intramuscular Q90 days   incobotulinumtoxinA   50 Units Intramuscular Q90 days   losartan   75 mg Oral Daily   metoprolol  succinate  25 mg Oral QHS   sodium chloride  flush  3 mL Intravenous Q12H   Continuous Infusions:  heparin  900 Units/hr  (02/08/23 0526)   PRN Meds: albuterol , labetalol   Allergies:   No Known Allergies  Social History:   Social History   Socioeconomic History   Marital status: Married    Spouse name: Koren   Number of children: 2   Years of education: 13   Highest education level: Not on file  Occupational History   Occupation: Advertising account planner    Comment: Event organiser  Tobacco Use   Smoking status: Never    Passive exposure: Past   Smokeless tobacco: Never  Vaping Use   Vaping status: Never Used  Substance and Sexual Activity   Alcohol use: Yes    Alcohol/week: 14.0 standard drinks of alcohol    Types: 14 Standard drinks or equivalent per week    Comment: Weekly   Drug use: No   Sexual activity: Yes  Other Topics Concern   Not on file  Social History Narrative  PFT---08-20-2009   fev1-2.04, 92%, fev1/fvc 0.79   Widowed, remarried.  2 kids   Works part time Customer Service Manager       Lives with husband   Right handed   Caffeine: 2 cups of coffee in the AM   Social Drivers of Health   Financial Resource Strain: Not on file  Food Insecurity: Not on file  Transportation Needs: Not on file  Physical Activity: Sufficiently Active (11/24/2016)   Exercise Vital Sign    Days of Exercise per Week: 5 days    Minutes of Exercise per Session: 40 min  Stress: Not on file  Social Connections: Not on file  Intimate Partner Violence: Not on file    Family History:   Family History  Problem Relation Age of Onset   Hypertension Brother    Stroke Maternal Grandmother    Colon polyps Neg Hx    Colon cancer Neg Hx    Esophageal cancer Neg Hx    Rectal cancer Neg Hx    Stomach cancer Neg Hx    Family Status:  Family Status  Relation Name Status   Mother  Deceased at age 58       in MVA   Father  Deceased       died young, unknown cause   Sister  Alive   Brother  Alive   MGM  Deceased   MGF  Deceased   PGM  Deceased   PGF  Deceased   Neg Hx  (Not Specified)   No partnership data on file    ROS:  Please see the history of present illness.  All other ROS reviewed and negative.     Physical Exam/Data:   Vitals:   02/08/23 0600 02/08/23 0730 02/08/23 0800 02/08/23 0838  BP: (!) 158/85 (!) 159/76 (!) 116/96   Pulse: 73 75 71   Resp: 20 16 15    Temp:    97.8 F (36.6 C)  TempSrc:    Oral  SpO2: 96% 97% 97%   Weight:      Height:       No intake or output data in the 24 hours ending 02/08/23 0922 Filed Weights   02/07/23 2213  Weight: 90.3 kg   Body mass index is 34.16 kg/m.   General: Well developed, well nourished, NAD Lungs: R>L lower lobe rhonchi. Breathing is unlabored however SOB with communication Cardiovascular: RRR with S1 S2. No murmurs Abdomen: Soft, non-tender, non-distended. No obvious abdominal masses. Extremities: No edema.  Neuro: Alert and oriented. No focal deficits. No facial asymmetry. MAE spontaneously. Psych: Responds to questions appropriately with normal affect.    EKG:  The EKG was personally reviewed and demonstrates:  NSR with no acute ST/T changes. Moderate LVH.  Telemetry:  Telemetry was personally reviewed and demonstrates: 02/08/23 NSR with rates in the 70-80s   Relevant CV Studies:  Cardiac Studies & Procedures     STRESS TESTS  EXERCISE TOLERANCE TEST (ETT) 11/23/2016  Narrative  Blood pressure was elevated at baseline and demonstrated a hypertensive response to exercise.  Upsloping ST segment depression ST segment depression was noted during stress in the II, III, aVF, V5 and V6 leads, and returning to baseline after 1-5 minutes of recovery.  No T wave inversion was noted during stress.  Negative, adequate stress test.  ECHOCARDIOGRAM  ECHOCARDIOGRAM COMPLETE 11/22/2016  Narrative *[facility]* [address] [place], [place] [address] [phone]  ------------------------------------------------------------------- Transthoracic Echocardiography  Patient:     Melvin-Lewis,  Asli MR #:       [id number] Study Date: 11/22/2016 Gender:     F Age:        48 Height:     165.1 cm Weight:     81.2 kg BSA:        1.95 m^2 Pt. Status: Room:  ORDERING     Alm Clay, MD REFERRING    Alm Clay, MD SONOGRAPHER  Waldo Guadalajara, RCS ATTENDING    Leim Moose, M.D. PERFORMING   [facility], Outpatient  cc:  ------------------------------------------------------------------- LV EF: 65% -   70%  ------------------------------------------------------------------- Indications:      Syncope (R55).  ------------------------------------------------------------------- History:   PMH:  Palpitations.  Chest pain.  Risk factors: Hypertension.  ------------------------------------------------------------------- Study Conclusions  - Left ventricle: The cavity size was normal. There was moderate concentric hypertrophy. Systolic function was vigorous. The estimated ejection fraction was in the range of 65% to 70%. Wall motion was normal; there were no regional wall motion abnormalities. Doppler parameters are consistent with abnormal left ventricular relaxation (grade 1 diastolic dysfunction). Doppler parameters are consistent with elevated ventricular end-diastolic filling pressure. - Aortic valve: Mobility was not restricted. There was no regurgitation. - Ascending aorta: The ascending aorta was normal in size. - Left atrium: The atrium was normal in size. - Right ventricle: Systolic function was normal. - Tricuspid valve: There was mild regurgitation. - Pulmonary arteries: Systolic pressure was within the normal range. - Inferior vena cava: The vessel was normal in size. The respirophasic diameter changes were in the normal range (= 50%), consistent with normal central venous pressure. - Pericardium, extracardiac: There was no pericardial effusion.  ------------------------------------------------------------------- Labs, prior tests,  procedures, and surgery: ECG.     Abnormal.  ------------------------------------------------------------------- Study data:  No prior study was available for comparison.  Study status:  Routine.  Procedure:  The patient reported no pain pre or post test. Transthoracic echocardiography. Image quality was adequate. Intravenous contrast (Definity ) was administered. Transthoracic echocardiography.  M-mode, complete 2D, spectral Doppler, and color Doppler.  Birthdate:  Patient birthdate: 20-Feb-1945.  Age:  Patient is 78 yr old.  Sex:  Gender: female. BMI: 29.8 kg/m^2.  Blood pressure:     138/79  Patient status: Outpatient.  Study date:  Study date: 11/22/2016. Study time: 08:47 AM.  Location:  [facility]  -------------------------------------------------------------------  ------------------------------------------------------------------- Left ventricle:  The cavity size was normal. There was moderate concentric hypertrophy. Systolic function was vigorous. The estimated ejection fraction was in the range of 65% to 70%. Wall motion was normal; there were no regional wall motion abnormalities. Doppler parameters are consistent with abnormal left ventricular relaxation (grade 1 diastolic dysfunction). Doppler parameters are consistent with elevated ventricular end-diastolic filling pressure.  ------------------------------------------------------------------- Aortic valve:   Trileaflet; mildly thickened, mildly calcified leaflets. Mobility was not restricted.  Doppler:  Transvalvular velocity was within the normal range. There was no stenosis. There was no regurgitation.  ------------------------------------------------------------------- Aorta:  Aortic root: The aortic root was normal in size. Ascending aorta: The ascending aorta was normal in size.  ------------------------------------------------------------------- Mitral valve:   Structurally normal valve.   Mobility was  not restricted.  Doppler:  Transvalvular velocity was within the normal range. There was no evidence for stenosis. There was trivial regurgitation.    Valve area by pressure half-time: 3.14 cm^2. Indexed valve area by pressure half-time: 1.61 cm^2/m^2.    Peak gradient (D): 4 mm Hg.  ------------------------------------------------------------------- Left atrium:  The atrium was normal in size.  ------------------------------------------------------------------- Right ventricle:  The cavity size was normal. Wall thickness was normal. Systolic function was normal.  ------------------------------------------------------------------- Pulmonic valve:    Doppler:  Transvalvular velocity was within the normal range. There was no evidence for stenosis. There was trivial regurgitation.  ------------------------------------------------------------------- Tricuspid valve:   Structurally normal valve.    Doppler: Transvalvular velocity was within the normal range. There was mild regurgitation.  ------------------------------------------------------------------- Pulmonary artery:   The main pulmonary artery was normal-sized. Systolic pressure was within the normal range.  ------------------------------------------------------------------- Right atrium:  The atrium was normal in size.  ------------------------------------------------------------------- Pericardium:  There was no pericardial effusion.  ------------------------------------------------------------------- Systemic veins: Inferior vena cava: The vessel was normal in size. The respirophasic diameter changes were in the normal range (= 50%), consistent with normal central venous pressure. Diameter: 15 mm.  ------------------------------------------------------------------- Measurements  IVC                                      Value          Reference ID                                       15    mm       ----------  Left  ventricle                           Value          Reference LV ID, ED, PLAX chordal          (L)     41    mm       43 - 52 LV ID, ES, PLAX chordal                  30    mm       23 - 38 LV fx shortening, PLAX chordal   (L)     27    %        >=29 LV PW thickness, ED                      15    mm       ---------- IVS/LV PW ratio, ED                      0.87           <=1.3 Stroke volume, 2D                        97    ml       ---------- Stroke volume/bsa, 2D                    50    ml/m^2   ---------- LV e&', lateral                           5.44  cm/s     ---------- LV E/e&', lateral                         17.65          ---------- LV e&', medial  6.74  cm/s     ---------- LV E/e&', medial                          14.24          ---------- LV e&', average                           6.09  cm/s     ---------- LV E/e&', average                         15.76          ----------  Ventricular septum                       Value          Reference IVS thickness, ED                        13    mm       ----------  LVOT                                     Value          Reference LVOT ID, S                               22    mm       ---------- LVOT area                                3.8   cm^2     ---------- LVOT peak velocity, S                    109   cm/s     ---------- LVOT mean velocity, S                    73.6  cm/s     ---------- LVOT VTI, S                              25.6  cm       ----------  Aortic valve                             Value          Reference Aortic regurg pressure half-time         681   ms       ----------  Aorta                                    Value          Reference Aortic root ID, ED                       36    mm       ----------  Left atrium  Value          Reference LA ID, A-P, ES                           38    mm       ---------- LA ID/bsa, A-P                           1.94  cm/m^2    <=2.2 LA volume, S                             61.1  ml       ---------- LA volume/bsa, S                         31.3  ml/m^2   ---------- LA volume, ES, 1-p A4C                   46.9  ml       ---------- LA volume/bsa, ES, 1-p A4C               24    ml/m^2   ---------- LA volume, ES, 1-p A2C                   74.7  ml       ---------- LA volume/bsa, ES, 1-p A2C               38.2  ml/m^2   ----------  Mitral valve                             Value          Reference Mitral E-wave peak velocity              96    cm/s     ---------- Mitral A-wave peak velocity              102   cm/s     ---------- Mitral deceleration time         (H)     239   ms       150 - 230 Mitral pressure half-time                70    ms       ---------- Mitral peak gradient, D                  4     mm Hg    ---------- Mitral E/A ratio, peak                   0.9            ---------- Mitral valve area, PHT, DP               3.14  cm^2     ---------- Mitral valve area/bsa, PHT, DP           1.61  cm^2/m^2 ----------  Pulmonary arteries                       Value          Reference PA pressure, S, DP  19    mm Hg    <=30  Tricuspid valve                          Value          Reference Tricuspid regurg peak velocity           198   cm/s     ---------- Tricuspid peak RV-RA gradient            16    mm Hg    ---------- Tricuspid maximal regurg                 198   cm/s     ---------- velocity, PISA  Right atrium                             Value          Reference RA ID, S-I, ES, A4C              (H)     51.7  mm       34 - 49 RA area, ES, A4C                         15.3  cm^2     8.3 - 19.5 RA volume, ES, A/L                       38    ml       ---------- RA volume/bsa, ES, A/L                   19.4  ml/m^2   ----------  Systemic veins                           Value          Reference Estimated CVP                            3     mm Hg    ----------  Right ventricle                           Value          Reference TAPSE                                    25.4  mm       ---------- RV pressure, S, DP                       19    mm Hg    <=30 RV s&', lateral, S                        13.1  cm/s     ----------  Legend: (L)  and  (H)  mark values outside specified reference range.  ------------------------------------------------------------------- Prepared and Electronically Authenticated by  Leim Moose, M.D. 2018-11-20T12:08:19   MONITORS  LONG TERM MONITOR (3-14 DAYS) 12/04/2020  Narrative Normal sinus rhythm Rare supraventricular ectopic beats Rare ventricular ectopic beats Ventricular bigeminy was present.  Laboratory Data:  Chemistry Recent Labs  Lab 02/07/23 2233 02/08/23 0502  NA 137 129*  K 4.3 4.0  CL 97* 98  CO2 25 19*  GLUCOSE 85 109*  BUN 15 15  CREATININE 0.66 0.56  CALCIUM  9.3 8.6*  GFRNONAA >60 >60  ANIONGAP 15 12    Total Protein  Date Value Ref Range Status  02/08/2023 6.2 (L) 6.5 - 8.1 g/dL Final   Albumin  Date Value Ref Range Status  02/08/2023 3.6 3.5 - 5.0 g/dL Final   AST  Date Value Ref Range Status  02/08/2023 30 15 - 41 U/L Final    Comment:    HEMOLYSIS AT THIS LEVEL MAY AFFECT RESULT   ALT  Date Value Ref Range Status  02/08/2023 25 0 - 44 U/L Final    Comment:    HEMOLYSIS AT THIS LEVEL MAY AFFECT RESULT   Alkaline Phosphatase  Date Value Ref Range Status  02/08/2023 62 38 - 126 U/L Final   Total Bilirubin  Date Value Ref Range Status  02/08/2023 0.9 0.0 - 1.2 mg/dL Final    Comment:    HEMOLYSIS AT THIS LEVEL MAY AFFECT RESULT   Hematology Recent Labs  Lab 02/07/23 2233  WBC 10.4  RBC 4.56  HGB 13.9  HCT 40.8  MCV 89.5  MCH 30.5  MCHC 34.1  RDW 12.8  PLT 329   Cardiac EnzymesNo results for input(s): TROPONINI in the last 168 hours. No results for input(s): TROPIPOC in the last 168 hours.  BNPNo results for input(s): BNP, PROBNP in the last 168 hours.   DDimer No results for input(s): DDIMER in the last 168 hours. TSH:  Lab Results  Component Value Date   TSH 1.74 04/29/2022   Lipids: Lab Results  Component Value Date   CHOL 164 02/08/2023   HDL 62 02/08/2023   LDLCALC 82 02/08/2023   TRIG 100 02/08/2023   CHOLHDL 2.6 02/08/2023   HgbA1c: Lab Results  Component Value Date   HGBA1C 5.6 02/08/2023   Radiology/Studies:  DG Chest 2 View Result Date: 02/07/2023 CLINICAL DATA:  chest pain EXAM: CHEST - 2 VIEW COMPARISON:  Chest x-ray 08/22/2020, CT chest 08/03/2015 FINDINGS: The heart and mediastinal contours are unchanged. No focal consolidation. Chronic coarsened interstitial markings with no overt pulmonary edema. No pleural effusion. No pneumothorax. No acute osseous abnormality. IMPRESSION: No active cardiopulmonary disease. Electronically Signed   By: Morgane  Naveau M.D.   On: 02/07/2023 22:33   Assessment and Plan:   NSTEMI: Prior cardiac workup with echocardiogram and GTX 11/2016 with no concern for ischemia however appears to have had profound HTN with exercise. Presented to [facility] after an episode of angina with recent worsening DOE concerning for NSTEMI. HsT 48>>132>>165. EKG with no acute changes. LDL 82, HDL 62, HbA1c, 5.6. CXR with no cardiopulmonary abnormalities. Echocardiogram pending. Took 81mg  ASA with an additional 162mg  on ED arrival. Continue IV Heparin  with concern for ACS versus demand ischemia in the setting of HTN. Plan LHC for further coronary evaluation. Will adjust antihypertensives as below and follow cath results.  The patient understands that risks include but are not limited to stroke (1 in 1000), death (1 in 1000), kidney failure [usually temporary] (1 in 500), bleeding (1 in 200), allergic reaction [possibly serious] (1 in 200), and agrees to proceed.    Hypertensive urgency: Followed by PCP with what sounds to be uncontrolled HTN with SBPs in the 150's. Plan to add amlodipine  10mg  daily. Hold losartan  for  cath. Will follow response for further adjustments if needed.   Hyponatremia: Na+ 129 on ED arrival. Per primary team.   Chronic interstitial lung markings: Noted on CXR upon arrival. Primary team to consider CT chest without contrast for further assessment. Pt reports following with OP pulmonary in the past due to stable lung nodules. No tobacco hx however long time second-hand smoke.   Principal Problem:   NSTEMI (non-ST elevated myocardial infarction) [facility]) Active Problems:   Hypertensive urgency  For questions or updates, please contact [facility] Please consult [email or web address] for contact info under Cardiology/STEMI.   SignedKate Minus NP-C [facility] Pager: [phone] 02/08/2023 9:22 AM

## 2023-02-08 NOTE — ED Notes (Signed)
 Echo at bedside

## 2023-02-08 NOTE — ED Notes (Signed)
 PT started eating dinner and began to vomit.Physician notified.

## 2023-02-08 NOTE — Progress Notes (Signed)
 PHARMACY - ANTICOAGULATION CONSULT NOTE  Pharmacy Consult for IV heparin  Indication: chest pain/ACS  No Known Allergies  Patient Measurements: Height: 5' 4 (162.6 cm) Weight: 90.3 kg (199 lb) IBW/kg (Calculated) : 54.7 Heparin  Dosing Weight: 74.9 kg  Vital Signs: Temp: 98 F (36.7 C) (02/05 0352) Temp Source: Oral (02/05 0352) BP: 186/88 (02/05 0352) Pulse Rate: 77 (02/05 0352)  Labs: Recent Labs    02/07/23 2233 02/08/23 0044  HGB 13.9  --   HCT 40.8  --   PLT 329  --   CREATININE 0.66  --   TROPONINIHS 48* 132*    Estimated Creatinine Clearance: 65.1 mL/min (by C-G formula based on SCr of 0.66 mg/dL).   Medical History: Past Medical History:  Diagnosis Date   Arthritis    generalized-bilateral hands   Asthma    hx of   Blepharitis of both eyes    Cataract    bilateral-LEFT eye   COPD (chronic obstructive pulmonary disease) (HCC)    not being tx'd at this time (03/23/2020)   Diverticulitis    History of shingles    Hypertension    on meds   Lung nodules    Nodules noted on CT scan July 2017   OSA (obstructive sleep apnea)    Partial small bowel obstruction (HCC)     Assessment: Lynn Estes is a 78 y.o. year old female admitted on 02/07/2023 with concern for ACS. No anticoagulation prior to admission. Pharmacy consulted to dose heparin .  Goal of Therapy:  Heparin  level 0.3-0.7 units/ml Monitor platelets by anticoagulation protocol: Yes   Plan:  Heparin  4000 units x 1 as bolus followed by heparin  infusion at 900 units/hr 6 heparin  level  Daily heparin  level, CBC, and monitoring for bleeding F/u plans for anticoagulation and cards recs  Thank you for allowing pharmacy to participate in this patient's care.  Leonor GORMAN Bash, PharmD Emergency Medicine Clinical Pharmacist 02/08/2023,5:03 AM

## 2023-02-08 NOTE — ED Provider Notes (Addendum)
  EMERGENCY DEPARTMENT AT [facility] Provider Note   CSN: [id number] Arrival date & time: 02/07/23  2132     History  Chief Complaint  Patient presents with  . Chest Pain    Lynn Estes is a 78 y.o. female.  HPI     This is a 78 year old female with a history of hypertension and hyperlipidemia who presents with chest pain.  Patient reports just prior to arrival she had approximately 30 minutes of anterior chest pain that radiated around her right breast.  She describes it as a dull and pressure-like.  It self resolved.  At that time her husband took her blood pressure and noted it to be 190s over 90s.  She does have a history of high blood pressure.  She took her metoprolol  which she normally takes at night and pain resolved and reviewed.  She has not had any recurrence of pain.  Pain was not necessarily exertional in nature.  No recent fevers or cough.  No pleuritic nature to the pain.  Denies lower extremity swelling.   Home Medications Prior to Admission medications   Medication Sig Start Date End Date Taking? Authorizing Provider  Calcium  500 MG tablet Take 600 mg by mouth 2 (two) times daily.    [provider]  Cholecalciferol (VITAMIN D  PO) Take 2,000 Units by mouth daily.    [provider]  clobetasol (TEMOVATE) 0.05 % external solution Apply topically. 10/30/20   [provider]  clotrimazole  (LOTRIMIN ) 1 % cream Apply twice daily to rash for maximum 14 days. 08/15/22   Tobie Arleta SQUIBB, MD  Cyanocobalamin (VITAMIN B 12 PO) Take by mouth.    [provider]  diclofenac  (VOLTAREN ) 0.1 % ophthalmic solution Place 2 drops into both eyes 4 (four) times daily. 11/30/22   Duanne Butler DASEN, MD  diclofenac  (VOLTAREN ) 75 MG EC tablet Take 1 tablet (75 mg total) by mouth 2 (two) times daily as needed (joint pain). 12/30/22   Duanne Butler DASEN, MD  fluticasone  (FLONASE ) 50 MCG/ACT nasal spray Place 2 sprays into both  nostrils daily. 08/15/22   Tobie Arleta SQUIBB, MD  gabapentin  (NEURONTIN ) 300 MG capsule Take 3 capsules (900 mg total) by mouth at bedtime as needed. 01/18/23   Onita Duos, MD  losartan -hydrochlorothiazide  (HYZAAR) 100-25 MG tablet TAKE ONE TABLET BY MOUTH ONCE A DAY 11/14/22   Duanne Butler DASEN, MD  metoprolol  tartrate (LOPRESSOR ) 25 MG tablet TAKE ONE TABLET BY MOUTH ONCE A DAY 11/14/22   Duanne Butler DASEN, MD  Omega-3 Fatty Acids (FISH OIL) 1000 MG CAPS Take 1,000 mg by mouth daily.    [provider]  Semaglutide -Weight Management (WEGOVY ) 0.5 MG/0.5ML SOAJ Inject 0.5 mg into the skin once a week. Patient not taking: Reported on 01/18/2023 12/29/22   Duanne Butler DASEN, MD  triamcinolone  ointment (KENALOG ) 0.1 % Apply twice daily for flare ups below neck, maximum 10 days. 08/15/22   Tobie Arleta SQUIBB, MD  TURMERIC PO Take by mouth.    [provider]  Zinc 50 MG TABS Take by mouth.    [provider]      Allergies    Patient has no known allergies.    Review of Systems   Review of Systems  Constitutional:  Negative for fever.  Respiratory:  Negative for shortness of breath.   Cardiovascular:  Positive for chest pain. Negative for leg swelling.  Gastrointestinal:  Negative for abdominal pain, nausea and vomiting.  All other systems  reviewed and are negative.   Physical Exam Updated Vital Signs BP (!) 186/88   Pulse 77   Temp 98 F (36.7 C) (Oral)   Resp 18   Ht 1.626 m (5' 4)   Wt 90.3 kg   SpO2 97%   BMI 34.16 kg/m  Physical Exam Vitals and nursing note reviewed.  Constitutional:      Appearance: She is well-developed. She is obese. She is not ill-appearing.  HENT:     Head: Normocephalic and atraumatic.  Eyes:     Pupils: Pupils are equal, round, and reactive to light.  Cardiovascular:     Rate and Rhythm: Normal rate and regular rhythm.     Heart sounds: Normal heart sounds.  Pulmonary:     Effort: Pulmonary effort is normal. No respiratory  distress.     Breath sounds: No wheezing.  Abdominal:     General: Bowel sounds are normal.     Palpations: Abdomen is soft.  Musculoskeletal:     Cervical back: Neck supple.  Skin:    General: Skin is warm and dry.  Neurological:     General: No focal deficit present.     Mental Status: She is alert and oriented to person, place, and time.     ED Results / Procedures / Treatments   Labs (all labs ordered are listed, but only abnormal results are displayed) Labs Reviewed  BASIC METABOLIC PANEL - Abnormal; Notable for the following components:      Result Value   Chloride 97 (*)    All other components within normal limits  TROPONIN I (HIGH SENSITIVITY) - Abnormal; Notable for the following components:   Troponin I (High Sensitivity) 48 (*)    All other components within normal limits  TROPONIN I (HIGH SENSITIVITY) - Abnormal; Notable for the following components:   Troponin I (High Sensitivity) 132 (*)    All other components within normal limits  CBC  LIPASE, BLOOD    EKG EKG Interpretation Date/Time:  Tuesday February 07 2023 22:55:01 EST Ventricular Rate:  86 PR Interval:  180 QRS Duration:  86 QT Interval:  376 QTC Calculation: 449 R Axis:   -51  Text Interpretation: Normal sinus rhythm Left axis deviation Moderate voltage criteria for LVH, may be normal variant ( R in aVL , Cornell product ) Possible Anterior infarct , age undetermined Abnormal ECG When compared with ECG of 22-Aug-2020 07:24, No significant change was found Confirmed by Raford Lenis ([id number]) on 02/08/2023 12:01:50 AM  Radiology DG Chest 2 View Result Date: 02/07/2023 CLINICAL DATA:  chest pain EXAM: CHEST - 2 VIEW COMPARISON:  Chest x-ray 08/22/2020, CT chest 08/03/2015 FINDINGS: The heart and mediastinal contours are unchanged. No focal consolidation. Chronic coarsened interstitial markings with no overt pulmonary edema. No pleural effusion. No pneumothorax. No acute osseous abnormality. IMPRESSION:  No active cardiopulmonary disease. Electronically Signed   By: Morgane  Naveau M.D.   On: 02/07/2023 22:33    Procedures Procedures    Medications Ordered in ED Medications  aspirin  chewable tablet 162 mg (162 mg Oral Given 02/08/23 0414)  nitroGLYCERIN  (NITROGLYN) 2 % ointment 1 inch (1 inch Topical Given 02/08/23 0414)    ED Course/ Medical Decision Making/ A&P Clinical Course as of 02/08/23 0501  Wed Feb 08, 2023  0409 Spoke to cardiology fellow [place].  REcommends hospitalist admit.  Hypertensive emergency vs NSTEMI. [CH]    Clinical Course User Index [CH] Adelaide Pfefferkorn, Charmaine FALCON, MD  Medical Decision Making Amount and/or Complexity of Data Reviewed Labs: ordered. Radiology: ordered.  Risk OTC drugs. Prescription drug management. Decision regarding hospitalization.   This patient presents to the ED for concern of chest pain, this involves an extensive number of treatment options, and is a complaint that carries with it a high risk of complications and morbidity.  I considered the following differential and admission for this acute, potentially life threatening condition.  The differential diagnosis includes ACS, PE, pneumothorax, pneumonia, hypertensive urgency, hypertensive emergency  MDM:    This is a 78 year old female who presents with concerns for chest discomfort.  Self resolved after approximately 30 minutes.  She does have history of hypertension and hyperlipidemia.  She is not on anything for her cholesterol.  She is a non-smoker.  Reports that she has not had any heart testing probably close to 10 years.  She does have an echocardiogram in our system from 2018 which showed a normal EF.  She has been symptom-free.  Here she is hypertensive.  Current blood pressure 186/88.  She did take her metoprolol .  EKG does not show any acute ischemic changes.  Initial troponin mildly elevated in the 50s.  Repeat troponin 132.  Patient did take aspirin   prior to arrival.  She took 1 baby aspirin .  Additional aspirin  given and nitroglycerin  paste was applied.  Will consult with cardiology.  Likely needs ischemic evaluation.  (Labs, imaging, consults)  Labs: I Ordered, and personally interpreted labs.  The pertinent results include: CBC, CMP, troponin x 2  Imaging Studies ordered: I ordered imaging studies including x-ray I independently visualized and interpreted imaging. I agree with the radiologist interpretation  Additional history obtained from chart review.  External records from outside source obtained and reviewed including echocardiogram  Cardiac Monitoring: .The patient was maintained on a cardiac monitor.  If on the cardiac monitor, I personally viewed and interpreted the cardiac monitored which showed an underlying rhythm of: Sinus  Reevaluation: After the interventions noted above, I reevaluated the patient and found that they have :resolved  Social Determinants of Health: . lives independently  Disposition: Admit  Co morbidities that complicate the patient evaluation . Past Medical History:  Diagnosis Date  . Arthritis    generalized-bilateral hands  . Asthma    hx of  . Blepharitis of both eyes   . Cataract    bilateral-LEFT eye  . COPD (chronic obstructive pulmonary disease) (HCC)    not being tx'd at this time (03/23/2020)  . Diverticulitis   . History of shingles   . Hypertension    on meds  . Lung nodules    Nodules noted on CT scan July 2017  . OSA (obstructive sleep apnea)   . Partial small bowel obstruction (HCC)      Medicines Meds ordered this encounter  Medications  . aspirin  chewable tablet 162 mg  . nitroGLYCERIN  (NITROGLYN) 2 % ointment 1 inch    I have reviewed the patients home medicines and have made adjustments as needed  Problem List / ED Course: Problem List Items Addressed This Visit       Cardiovascular and Mediastinum   * (Principal) NSTEMI (non-ST elevated myocardial  infarction) (HCC) - Primary   Other Visit Diagnoses       Hypertension, unspecified type       Relevant Medications   aspirin  chewable tablet 162 mg (Completed)   nitroGLYCERIN  (NITROGLYN) 2 % ointment 1 inch (Completed)  Final Clinical Impression(s) / ED Diagnoses Final diagnoses:  NSTEMI (non-ST elevated myocardial infarction) (HCC)  Hypertension, unspecified type    Rx / DC Orders ED Discharge Orders     None         Kandice Schmelter, Charmaine FALCON, MD 02/08/23 0400    Bari Charmaine FALCON, MD 02/08/23 [id number]

## 2023-02-08 NOTE — H&P (Signed)
 History and Physical    Lynn Estes FMW:[id number] DOB: [birth year] DOA: 02/07/2023  PCP: Duanne Butler DASEN, MD   Patient coming from: Home   Chief Complaint:  Chief Complaint  Patient presents with   Chest Pain    HPI:  Lynn Estes is a 78 y.o. female with hx of asthma, hypertension, OSA, who presented after an episode of chest pain.  Reports while leaving dinner had worsening dyspnea with minimal exertion walking to the car. Then once home had episode of chest pain from her central chest wrapping under the R breast. Associated with lightheadedness, flushing, nausea. Pain lasted about 30 minutes.  She took her blood pressure on this time and it was elevated in the 180s systolic. Took an aspirin  at home. Had resolved just before coming to the ED. No active chest pain at present.  Today also noted swelling in both ankles.  No orthopnea.  Otherwise reports over the past 6 months or so she has had dyspnea on exertion after walking up 6 - 10 minutes.  Denies any exertional chest pain with this amount of activity.    Review of Systems:  ROS complete and negative except as marked above   No Known Allergies  Prior to Admission medications   Medication Sig Start Date End Date Taking? Authorizing Provider  calcium  carbonate (OS-CAL) 600 MG tablet Take 600 mg by mouth daily.   Yes [provider]  Cholecalciferol (VITAMIN D  PO) Take 2,000 Units by mouth daily.   Yes [provider]  clobetasol (TEMOVATE) 0.05 % external solution Apply 1 Application topically daily. 10/30/20  Yes [provider]  clotrimazole  (LOTRIMIN ) 1 % cream Apply twice daily to rash for maximum 14 days. Patient taking differently: Apply 1 Application topically daily as needed (for rash). 08/15/22  Yes Tobie Arleta SQUIBB, MD  Cyanocobalamin (VITAMIN B 12 PO) Take 1 tablet by mouth daily.   Yes [provider]  diclofenac  (VOLTAREN ) 75 MG EC tablet Take 1 tablet (75 mg total) by  mouth 2 (two) times daily as needed (joint pain). 12/30/22  Yes Duanne Butler DASEN, MD  fluticasone  (FLONASE ) 50 MCG/ACT nasal spray Place 2 sprays into both nostrils daily. Patient taking differently: Place 2 sprays into both nostrils daily as needed (for congestion). 08/15/22  Yes Tobie Arleta SQUIBB, MD  losartan -hydrochlorothiazide  (HYZAAR) 100-25 MG tablet TAKE ONE TABLET BY MOUTH ONCE A DAY 11/14/22  Yes Duanne Butler DASEN, MD  metoprolol  tartrate (LOPRESSOR ) 25 MG tablet TAKE ONE TABLET BY MOUTH ONCE A DAY 11/14/22  Yes Duanne Butler DASEN, MD  Omega-3 Fatty Acids (FISH OIL) 1000 MG CAPS Take 1,000 mg by mouth daily.   Yes [provider]  TURMERIC PO Take 1 capsule by mouth daily.   Yes [provider]  gabapentin  (NEURONTIN ) 300 MG capsule Take 3 capsules (900 mg total) by mouth at bedtime as needed. Patient not taking: Reported on 02/08/2023 01/18/23   Onita Duos, MD    Past Medical History:  Diagnosis Date   Arthritis    generalized-bilateral hands   Asthma    hx of   Blepharitis of both eyes    Cataract    bilateral-LEFT eye   COPD (chronic obstructive pulmonary disease) (HCC)    not being tx'd at this time (03/23/2020)   Diverticulitis    History of shingles    Hypertension    on meds   Lung nodules    Nodules noted on CT scan July 2017   OSA (obstructive  sleep apnea)    Partial small bowel obstruction [facility])     Past Surgical History:  Procedure Laterality Date   ANKLE SURGERY  2019   CATARACT EXTRACTION W/PHACO Left 06/08/2015   Procedure: CATARACT EXTRACTION PHACO AND INTRAOCULAR LENS PLACEMENT LEFT EYE CDE=7.18;  Surgeon: Dow JULIANNA Burke, MD;  Location: [facility];  Service: Ophthalmology;  Laterality: Left;   DILATION AND CURETTAGE OF UTERUS     X 2   EXERCISE TOLERANCE TEST (GXT)  11/2016   Low Exercise Tolerance - 6:24 min (dyspnea, fatigue), upsloping ST segment depression noted.  Nondiagnostic.  HR 150 bpm = 97% MPHR). Hypertensive response to exercise.  NEGATIVE test.  No arrhythmias.   right rotator cuff surgery     TRANSTHORACIC ECHOCARDIOGRAM  11/2016   Normal LV size and function.  GR 1 DD.  Moderate concentric hypertrophy.  Vigorous LV function with EF of 65-70%.   WISDOM TOOTH EXTRACTION       reports that she has never smoked. She has been exposed to tobacco smoke. She has never used smokeless tobacco. She reports current alcohol use of about 14.0 standard drinks of alcohol per week. She reports that she does not use drugs.  Family History  Problem Relation Age of Onset   Hypertension Brother    Stroke Maternal Grandmother    Colon polyps Neg Hx    Colon cancer Neg Hx    Esophageal cancer Neg Hx    Rectal cancer Neg Hx    Stomach cancer Neg Hx      Physical Exam: Vitals:   02/07/23 2137 02/07/23 2213 02/08/23 0352 02/08/23 0600  BP: (!) 166/84  (!) 186/88 (!) 158/85  Pulse: 95  77 73  Resp: 17  18 20   Temp: 98.2 F (36.8 C)  98 F (36.7 C)   TempSrc: Oral  Oral   SpO2: 96%  97% 96%  Weight:  90.3 kg    Height:  5' 4 (1.626 m)      Gen: Awake, alert, NAD   CV: Regular, normal S1, S2, no murmurs  Resp: Normal WOB, diminished air movement, fine rales in the bases. Abd: Flat, normoactive, nontender MSK: Symmetric, 1+ edema below the ankle Skin: No rashes or lesions to exposed skin  Neuro: Alert and interactive  Psych: euthymic, appropriate    Data review:   Labs reviewed, notable for:   High-sensitivity troponin 48 -> 132 Other chemistries and blood counts unremarkable  Micro:  No results found for this or any previous visit.  Imaging reviewed:  DG Chest 2 View Result Date: 02/07/2023 CLINICAL DATA:  chest pain EXAM: CHEST - 2 VIEW COMPARISON:  Chest x-ray 08/22/2020, CT chest 08/03/2015 FINDINGS: The heart and mediastinal contours are unchanged. No focal consolidation. Chronic coarsened interstitial markings with no overt pulmonary edema. No pleural effusion. No pneumothorax. No acute osseous  abnormality. IMPRESSION: No active cardiopulmonary disease. Electronically Signed   By: Morgane  Naveau M.D.   On: 02/07/2023 22:33    EKG:  Personally reviewed, sinus rhythm with LAD, LVH, PRWP, ?  Septal Q wave, 0.5 mm STE in V1 without contiguous lead elevation.  Minimal ST depression and T wave inversion in aVL  ED Course:  Took aspirin  81 mg at home, Treated with additional aspirin  162 mg, nitroglycerin  paste.  EDP consulted with cardiology fellow who felt troponin elevation possibly demand in the setting of severe range hypertension.  Plan for cardiology consultation in the morning.  Based on my discussion with the EDP  and our concern for ACS, initiated on heparin  drip.   Assessment/Plan:  78 y.o. female with hx asthma, hypertension, OSA, who presented after an episode of chest pain and found to have NSTEMI.  NSTEMI Acute episode of atypical quality dull right chest pain X 30 minutes, associated with lightheadedness, flushing, nausea.  Associated with borderline but severe range blood pressure in the 180s systolic. Setting of more chronic dyspnea on exertion.  EKG with findings above, possible ischemic type changes.  High-sensitivity troponin uptrending 48 -> 132.  Discussed with the patient unclear if type I event versus type II related to severe range pressures, will treat as type I for now. -Cardiology consulted to see in the morning.  Please call them to ensure she is seen. -Status post aspirin  81 mg at home, and additional 162 mg here. Give additional 81 mg x1 to complete load.  -Started on heparin  drip, pharmacy to dose -Give atorvastatin  40 mg x 1.  Check lipids and A1c. -Trend troponin to peak.  Check BNP -TTE ordered -N.p.o. in case requires intervention.  Hypertensive urgency Peak blood pressures in the 180s systolic, improved -Continue home losartan  75 mg daily, metoprolol  succinate 25 mg nightly -Labetalol  10 mg IV every 2 hours as needed for SBP greater than  180  Incidental findings:  Chronic interstitial coarse markings on CXR: Consider CT chest without to further assess.   Chronic medical problems: History asthma: Albuterol  as needed Hypertension: See HTN urgency above  OSA: not on cpap   Body mass index is 34.16 kg/m. Obesity class I, affecting medical care. Would benefit from continued weight loss.    DVT prophylaxis:  IV heparin  gtts Code Status:  Full Code Diet:  Diet Orders (From admission, onward)     Start     Ordered   02/08/23 0502  Diet NPO time specified Except for: Sips with Meds  Diet effective now       Question:  Except for  Answer:  Noralyn with Meds   02/08/23 0502           Family Communication:  No   Consults:  Cardiology   Admission status:   Inpatient, Telemetry bed  Severity of Illness: The appropriate patient status for this patient is INPATIENT. Inpatient status is judged to be reasonable and necessary in order to provide the required intensity of service to ensure the patient's safety. The patient's presenting symptoms, physical exam findings, and initial radiographic and laboratory data in the context of their chronic comorbidities is felt to place them at high risk for further clinical deterioration. Furthermore, it is not anticipated that the patient will be medically stable for discharge from the hospital within 2 midnights of admission.   * I certify that at the point of admission it is my clinical judgment that the patient will require inpatient hospital care spanning beyond 2 midnights from the point of admission due to high intensity of service, high risk for further deterioration and high frequency of surveillance required.*   Dorn Dawson, MD [facility]  How to contact the [facility] Attending or Consulting provider 7A - 7P or covering provider during after hours 7P -7A, for this patient.  Check the care team in [facility] and look for a) attending/consulting [facility] provider listed and b) the [facility]  team listed Log into [email or web address] and use [place]'s universal password to access. If you do not have the password, please contact the hospital operator. Locate the [facility] provider you are looking for under [facility] and page to a number that you can be directly reached. If you still have difficulty reaching the provider, please page the [facility] (Director on Call) for the Hospitalists listed on amion for assistance.  02/08/2023, 7:13 AM

## 2023-02-08 NOTE — Progress Notes (Signed)
  Echocardiogram 2D Echocardiogram has been performed.  Nirav Sweda L Jenna Ardoin RDCS 02/08/2023, 1:58 PM

## 2023-02-09 ENCOUNTER — Encounter (HOSPITAL_COMMUNITY)
Admission: EM | Disposition: A | Discharge status: Home / Self Care | Payer: Self-pay | Source: Home / Self Care | Attending: Internal Medicine

## 2023-02-09 DIAGNOSIS — I160 Hypertensive urgency: Secondary | ICD-10-CM | POA: Diagnosis not present

## 2023-02-09 DIAGNOSIS — I214 Non-ST elevation (NSTEMI) myocardial infarction: Secondary | ICD-10-CM | POA: Diagnosis not present

## 2023-02-09 HISTORY — PX: LEFT HEART CATH AND CORONARY ANGIOGRAPHY: CATH118249

## 2023-02-09 LAB — CBC
HCT: 36.7 % (ref 36.0–46.0)
Hemoglobin: 12.9 g/dL (ref 12.0–15.0)
MCH: 30.9 pg (ref 26.0–34.0)
MCHC: 35.1 g/dL (ref 30.0–36.0)
MCV: 87.8 fL (ref 80.0–100.0)
Platelets: 267 10*3/uL (ref 150–400)
RBC: 4.18 MIL/uL (ref 3.87–5.11)
RDW: 12.8 % (ref 11.5–15.5)
WBC: 8.7 10*3/uL (ref 4.0–10.5)
nRBC: 0 % (ref 0.0–0.2)

## 2023-02-09 LAB — HEPARIN LEVEL (UNFRACTIONATED)
Heparin Unfractionated: 0.22 [IU]/mL — ABNORMAL LOW (ref 0.30–0.70)
Heparin Unfractionated: <0.1 [IU]/mL — ABNORMAL LOW (ref 0.30–0.70)

## 2023-02-09 SURGERY — LEFT HEART CATH AND CORONARY ANGIOGRAPHY
Anesthesia: LOCAL

## 2023-02-09 SURGERY — CORONARY STENT INTERVENTION
Anesthesia: LOCAL

## 2023-02-09 MED ORDER — VERAPAMIL HCL 2.5 MG/ML IV SOLN
INTRAVENOUS | Status: DC | PRN
  Administered 2023-02-09: 10 mL via INTRA_ARTERIAL

## 2023-02-09 MED ORDER — SODIUM CHLORIDE 0.9% FLUSH: 3.0000 mL | INTRAVENOUS | Status: DC | PRN

## 2023-02-09 MED ORDER — SODIUM CHLORIDE 0.9 % IV SOLN: 250.0000 mL | INTRAVENOUS | Status: DC | PRN

## 2023-02-09 MED ORDER — HEPARIN BOLUS VIA INFUSION
3000.0000 [IU] | Freq: Once | INTRAVENOUS | Status: AC
Start: 2023-02-09 — End: 2023-02-09
  Administered 2023-02-09: 3000 [IU] via INTRAVENOUS
  Filled 2023-02-09: qty 3000

## 2023-02-09 MED ORDER — VERAPAMIL HCL 2.5 MG/ML IV SOLN
INTRAVENOUS | Status: AC
  Filled 2023-02-09: qty 2

## 2023-02-09 MED ORDER — HYDRALAZINE HCL 20 MG/ML IJ SOLN: 10.0000 mg | INTRAMUSCULAR | Status: DC | PRN

## 2023-02-09 MED ORDER — SODIUM CHLORIDE 0.9% FLUSH: 3.0000 mL | Freq: Two times a day (BID) | INTRAVENOUS | Status: DC

## 2023-02-09 MED ORDER — AMLODIPINE BESYLATE 10 MG PO TABS: 10.0000 mg | ORAL_TABLET | Freq: Every day | ORAL | 2 refills | Status: DC

## 2023-02-09 MED ORDER — METOPROLOL SUCCINATE ER 25 MG PO TB24: 25.0000 mg | ORAL_TABLET | Freq: Every day | ORAL | 2 refills | Status: DC

## 2023-02-09 MED ORDER — LOSARTAN POTASSIUM 100 MG PO TABS: 100.0000 mg | ORAL_TABLET | Freq: Every day | ORAL | 2 refills | Status: DC

## 2023-02-09 MED ORDER — SODIUM CHLORIDE 0.9 % WEIGHT BASED INFUSION
1.0000 mL/kg/h | INTRAVENOUS | Status: DC
  Administered 2023-02-09: 1 mL/kg/h via INTRAVENOUS

## 2023-02-09 MED ORDER — LIDOCAINE HCL (PF) 1 % IJ SOLN
INTRAMUSCULAR | Status: DC | PRN
  Administered 2023-02-09: 2 mL

## 2023-02-09 MED ORDER — LIDOCAINE HCL (PF) 1 % IJ SOLN
INTRAMUSCULAR | Status: AC
  Filled 2023-02-09: qty 30

## 2023-02-09 MED ORDER — HEPARIN SODIUM (PORCINE) 1000 UNIT/ML IJ SOLN
INTRAMUSCULAR | Status: DC | PRN
  Administered 2023-02-09: 4500 [IU] via INTRAVENOUS

## 2023-02-09 MED ORDER — MIDAZOLAM HCL 2 MG/2ML IJ SOLN
INTRAMUSCULAR | Status: DC | PRN
  Administered 2023-02-09: 1 mg via INTRAVENOUS

## 2023-02-09 MED ORDER — IOHEXOL 350 MG/ML SOLN
INTRAVENOUS | Status: DC | PRN
  Administered 2023-02-09: 40 mL via INTRA_ARTERIAL

## 2023-02-09 MED ORDER — ROSUVASTATIN CALCIUM 5 MG PO TABS: 10.0000 mg | ORAL_TABLET | Freq: Every day | ORAL | Status: DC

## 2023-02-09 MED ORDER — HEPARIN (PORCINE) IN NACL 1000-0.9 UT/500ML-% IV SOLN
INTRAVENOUS | Status: DC | PRN
  Administered 2023-02-09 (×2): 500 mL via INTRA_ARTERIAL

## 2023-02-09 MED ORDER — SODIUM CHLORIDE 0.9 % WEIGHT BASED INFUSION: 3.0000 mL/kg/h | INTRAVENOUS | Status: DC

## 2023-02-09 MED ORDER — LABETALOL HCL 5 MG/ML IV SOLN: 10.0000 mg | INTRAVENOUS | Status: DC | PRN

## 2023-02-09 MED ORDER — HEPARIN (PORCINE) IN NACL 1000-0.9 UT/500ML-% IV SOLN
INTRAVENOUS | Status: DC | PRN
  Administered 2023-02-09: 500 mL

## 2023-02-09 MED ORDER — HEPARIN SODIUM (PORCINE) 1000 UNIT/ML IJ SOLN
INTRAMUSCULAR | Status: AC
  Filled 2023-02-09: qty 10

## 2023-02-09 MED ORDER — LOSARTAN POTASSIUM 50 MG PO TABS
100.0000 mg | ORAL_TABLET | Freq: Every day | ORAL | Status: DC
  Administered 2023-02-09: 100 mg via ORAL
  Filled 2023-02-09: qty 2

## 2023-02-09 MED ORDER — SODIUM CHLORIDE 0.9 % WEIGHT BASED INFUSION
3.0000 mL/kg/h | INTRAVENOUS | Status: DC
  Administered 2023-02-09: 3 mL/kg/h via INTRAVENOUS

## 2023-02-09 MED ORDER — SODIUM CHLORIDE 0.9 % WEIGHT BASED INFUSION
1.0000 mL/kg/h | INTRAVENOUS | Status: DC
Start: 2023-02-10 — End: 2023-02-09

## 2023-02-09 MED ORDER — ROSUVASTATIN CALCIUM 10 MG PO TABS: 10.0000 mg | ORAL_TABLET | Freq: Every day | ORAL | 1 refills | Status: DC

## 2023-02-09 MED ORDER — MIDAZOLAM HCL 2 MG/2ML IJ SOLN
INTRAMUSCULAR | Status: AC
  Filled 2023-02-09: qty 2

## 2023-02-09 MED ORDER — FENTANYL CITRATE (PF) 100 MCG/2ML IJ SOLN
INTRAMUSCULAR | Status: DC | PRN
  Administered 2023-02-09: 25 ug via INTRAVENOUS

## 2023-02-09 MED ORDER — CLOTRIMAZOLE 1 % EX CREA: 1.0000 | TOPICAL_CREAM | Freq: Every day | CUTANEOUS | Status: AC | PRN

## 2023-02-09 MED ORDER — FENTANYL CITRATE (PF) 100 MCG/2ML IJ SOLN
INTRAMUSCULAR | Status: AC
  Filled 2023-02-09: qty 2

## 2023-02-09 MED ORDER — HEPARIN BOLUS VIA INFUSION
1000.0000 [IU] | Freq: Once | INTRAVENOUS | Status: AC
  Administered 2023-02-09: 1000 [IU] via INTRAVENOUS
  Filled 2023-02-09: qty 1000

## 2023-02-09 MED ORDER — ENOXAPARIN SODIUM 40 MG/0.4ML IJ SOSY: 40.0000 mg | PREFILLED_SYRINGE | INTRAMUSCULAR | Status: DC

## 2023-02-09 MED ORDER — ASPIRIN 81 MG PO CHEW
81.0000 mg | CHEWABLE_TABLET | ORAL | Status: DC
Start: 2023-02-10 — End: 2023-02-09

## 2023-02-09 MED ORDER — ASPIRIN 81 MG PO CHEW
81.0000 mg | CHEWABLE_TABLET | ORAL | Status: AC
  Administered 2023-02-09: 81 mg via ORAL
  Filled 2023-02-09: qty 1

## 2023-02-09 SURGICAL SUPPLY — 11 items
CATH 5FR JL3.5 JR4 ANG PIG MP (CATHETERS) IMPLANT
DEVICE RAD COMP TR BAND LRG (VASCULAR PRODUCTS) IMPLANT
GLIDESHEATH SLEND SS 6F .021 (SHEATH) IMPLANT
GUIDEWIRE INQWIRE 1.5J.035X260 (WIRE) IMPLANT
INQWIRE 1.5J .035X260CM (WIRE) ×1
PACK CARDIAC CATHETERIZATION (CUSTOM PROCEDURE TRAY) ×2 IMPLANT
PROTECTION STATION PRESSURIZED (MISCELLANEOUS) ×1
SET ATX-X65L (MISCELLANEOUS) IMPLANT
STATION PROTECTION PRESSURIZED (MISCELLANEOUS) IMPLANT
TRANSDUCER W/STOPCOCK (MISCELLANEOUS) IMPLANT
TUBING ART PRESS 72 MALE/FEM (TUBING) IMPLANT

## 2023-02-09 NOTE — Progress Notes (Signed)
 PHARMACY - ANTICOAGULATION CONSULT NOTE  Pharmacy Consult for IV heparin  Indication: chest pain/ACS  No Known Allergies  Patient Measurements: Height: 5' 4 (162.6 cm) Weight: 90.3 kg (199 lb) IBW/kg (Calculated) : 54.7 Heparin  Dosing Weight: 74.9 kg  Vital Signs: Temp: 98.4 F (36.9 C) (02/05 2249) Temp Source: Oral (02/05 2249) BP: 155/97 (02/05 2300) Pulse Rate: 99 (02/05 2315)  Labs: Recent Labs    02/07/23 2233 02/08/23 0044 02/08/23 0502 02/08/23 0839 02/08/23 1207 02/08/23 2349  HGB 13.9  --   --   --   --   --   HCT 40.8  --   --   --   --   --   PLT 329  --   --   --   --   --   LABPROT  --   --  13.6  --   --   --   INR  --   --  1.0  --   --   --   HEPARINUNFRC  --   --   --   --  <0.10* <0.10*  CREATININE 0.66  --  0.56  --   --   --   TROPONINIHS 48* 132*  --  165*  --   --     Estimated Creatinine Clearance: 65.1 mL/min (by C-G formula based on SCr of 0.56 mg/dL).   Medical History: Past Medical History:  Diagnosis Date   Arthritis    generalized-bilateral hands   Asthma    hx of   Blepharitis of both eyes    Cataract    bilateral-LEFT eye   COPD (chronic obstructive pulmonary disease) (HCC)    not being tx'd at this time (03/23/2020)   Diverticulitis    History of shingles    Hypertension    on meds   Lung nodules    Nodules noted on CT scan July 2017   OSA (obstructive sleep apnea)    Partial small bowel obstruction (HCC)     Assessment: Lynn Estes is a 78 y.o. year old female admitted on 02/07/2023 with concern for ACS. No anticoagulation prior to admission. Pharmacy consulted to dose heparin .  Initial heparin  level undetectable at 900 units/hr, no issues with infusion or overt s/sx of bleeding per RN.   2/6 AM update:  Heparin  level undetectable   Goal of Therapy:  Heparin  level 0.3-0.7 units/ml Monitor platelets by anticoagulation protocol: Yes   Plan:  Heparin  3000 units re-bolus Increase heparin  infusion to 1400  units/hr Heparin  level in 8 hours  Lynwood Mckusick, PharmD, BCPS Clinical Pharmacist Phone: [phone]

## 2023-02-09 NOTE — Progress Notes (Signed)
   Patient Name: Lynn Estes Date of Encounter: 02/09/2023 [facility] Cardiologist: None   Interval Summary  .    No chest pain Uncomfortable in ER bed Had nausea and vomiting last night, now resolved No abdominal pain  Vital Signs .    Vitals:   02/09/23 0530 02/09/23 0600 02/09/23 0630 02/09/23 0756  BP: (!) 145/67 (!) 109/96 (!) 142/69   Pulse: 79 73 74   Resp: 18 16 (!) 24   Temp:    98.2 F (36.8 C)  TempSrc:    Oral  SpO2: 94% 92% 97%   Weight:      Height:       No intake or output data in the 24 hours ending 02/09/23 0936    02/07/2023   10:13 PM 01/18/2023    4:06 PM 10/12/2022    4:08 PM  Last 3 Weights  Weight (lbs) 199 lb 203 lb 200 lb 6.4 oz  Weight (kg) 90.266 kg 92.08 kg 90.901 kg      Telemetry/ECG    Telemetry 02/09/2023: No arrhythmia  Physical Exam .   Physical Exam Vitals and nursing note reviewed.  Constitutional:      General: She is not in acute distress. Neck:     Vascular: No JVD.  Cardiovascular:     Rate and Rhythm: Normal rate and regular rhythm.     Heart sounds: Normal heart sounds. No murmur heard. Pulmonary:     Effort: Pulmonary effort is normal.     Breath sounds: Normal breath sounds. No wheezing or rales.      Assessment & Plan .     78 year old female with hypertension, COPD, asthma, admitted for chest pain, shortness of breath, elevated troponin.   Chest pain, dyspnea, elevated troponin: Differentials include hypertensive urgency with type II MI or true type I MI. Echocardiogram with no RV systolic dysfunction.  PE less likely. Currently chest pain-free. Continue aspirin , heparin .  Plan for cath today. Continue metoprolol  succinate 25 mg daily, amlodipine  10 mg daily. Holding losartan  with plans for catheter today.  Can resume after cath, along with her chlorthalidone. At that point, amlodipine  can be weaned down, as necessary. LDL 82 in the setting of NSTEMI.  Added Crestor  10  mg  Hypertension: As above. Resume losartan /hydrochlorothiazide  after cath, at which point, amlodipine  can be weaned off.    For questions or updates, please contact [facility] Please consult [email or web address] for contact info under        Signed, Newman JINNY Lawrence, MD

## 2023-02-09 NOTE — Interval H&P Note (Signed)
 History and Physical Interval Note:  02/09/2023 3:18 PM  Lynn Estes  has presented today for surgery, with the diagnosis of NSTEMI.  The various methods of treatment have been discussed with the patient and family. After consideration of risks, benefits and other options for treatment, the patient has consented to  Procedure(s): LEFT HEART CATH AND CORONARY ANGIOGRAPHY (N/A) as a surgical intervention.  The patient's history has been reviewed, patient examined, no change in status, stable for surgery.  I have reviewed the patient's chart and labs.  Questions were answered to the patient's satisfaction.    Cath Lab Visit (complete for each Cath Lab visit)  Clinical Evaluation Leading to the Procedure:   ACS: Yes.    Non-ACS:  N/A  Avina Eberle

## 2023-02-09 NOTE — Discharge Summary (Signed)
 Physician Discharge Summary   Patient: Lynn Estes MRN: [id number] DOB: 10-Jul-1945  Admit date:     02/07/2023  Discharge date: 02/09/23  Discharge Physician: Eric Nunnery   PCP: Duanne Butler DASEN, MD   Recommendations at discharge:  Repeat basic metabolic panel to follow electrolytes and renal function Reassess blood pressure and further adjust antihypertensive regimen as needed. Outpatient follow-up with cardiology service as instructed. Continue assisting patient with weight loss management.  Discharge Diagnoses: Principal Problem:   NSTEMI (non-ST elevated myocardial infarction) [facility]) Active Problems:   Hypertensive urgency  Brief Hospital admission course: As per H&P written by Dr. Keturah on 02/08/2023 Lynn Estes is a 78 y.o. female with hx of asthma, hypertension, OSA, who presented after an episode of chest pain.  Reports while leaving dinner had worsening dyspnea with minimal exertion walking to the car. Then once home had episode of chest pain from her central chest wrapping under the R breast. Associated with lightheadedness, flushing, nausea. Pain lasted about 30 minutes.  She took her blood pressure on this time and it was elevated in the 180s systolic. Took an aspirin  at home. Had resolved just before coming to the ED. No active chest pain at present.  Today also noted swelling in both ankles.  No orthopnea.   Otherwise reports over the past 6 months or so she has had dyspnea on exertion after walking up 6 - 10 minutes.  Denies any exertional chest pain with this amount of activity.    Assessment and Plan: 1-chest pain/NSTEMI -Patient with mild elevation in troponins along with chest pain -Demand ischemia versus possible ischemic process was suspected -2D echo not demonstrating wall motion abnormalities and ejection fraction was preserved. -Cardiac cath demonstrated nonobstructive coronary artery disease and risk factor modification and good blood pressure  control has been recommended by cardiology. -Patient has been discharged on Cozaar  and amlodipine  -Low-sodium diet has been recommended. -Continue baby aspirin , statin and metoprolol  on daily basis.  2-hypertensive urgency  -Present at time of admission -Low-sodium diet discussed with patient -Maintain good compliance with losartan , amlodipine  and metoprolol .   3-obstructive sleep apnea -Continue using CPAP -Further outpatient evaluation recommended.  4-history of asthma -No acute exacerbation appreciated -Continue as needed bronchodilator management.  5-hyperlipidemia -Continue statin.  6-class I obesity -Body mass index is 34.3 kg/m. -Calorie diet and portion control discussed with patient.  Consultants: Cardiology service Procedures performed: See below for x-ray report; 2D echo, cardiac cath. Disposition: Home Diet recommendation: Heart healthy/low-sodium diet.  DISCHARGE MEDICATION: Allergies as of 02/09/2023   No Known Allergies      Medication List     STOP taking these medications    diclofenac  75 MG EC tablet Commonly known as: VOLTAREN    gabapentin  300 MG capsule Commonly known as: NEURONTIN    losartan -hydrochlorothiazide  100-25 MG tablet Commonly known as: HYZAAR   metoprolol  tartrate 25 MG tablet Commonly known as: LOPRESSOR        TAKE these medications    amLODipine  10 MG tablet Commonly known as: NORVASC  Take 1 tablet (10 mg total) by mouth daily. Start taking on: February 10, 2023   calcium  carbonate 600 MG tablet Commonly known as: OS-CAL Take 600 mg by mouth daily.   clobetasol 0.05 % external solution Commonly known as: TEMOVATE Apply 1 Application topically daily.   clotrimazole  1 % cream Commonly known as: LOTRIMIN  Apply 1 Application topically daily as needed (for rash).   Fish Oil 1000 MG Caps Take 1,000 mg by mouth daily.   fluticasone   50 MCG/ACT nasal spray Commonly known as: FLONASE  Place 2 sprays into both  nostrils daily. What changed:  when to take this reasons to take this   losartan  100 MG tablet Commonly known as: COZAAR  Take 1 tablet (100 mg total) by mouth daily. Start taking on: February 10, 2023   metoprolol  succinate 25 MG 24 hr tablet Commonly known as: TOPROL -XL Take 1 tablet (25 mg total) by mouth at bedtime.   rosuvastatin  10 MG tablet Commonly known as: CRESTOR  Take 1 tablet (10 mg total) by mouth daily. Start taking on: February 10, 2023   TURMERIC PO Take 1 capsule by mouth daily.   VITAMIN B 12 PO Take 1 tablet by mouth daily.   VITAMIN D  PO Take 2,000 Units by mouth daily.        Follow-up Information     Duanne Butler DASEN, MD. Schedule an appointment as soon as possible for a visit in 10 day(s).   Specialty: Family Medicine Contact information: [address] [place] [address] [phone]                Discharge Exam: Fredricka Weights   02/07/23 2213 02/09/23 1151  Weight: 90.3 kg 90.6 kg   General exam: Alert, awake, oriented x 3; no chest pain, good saturation on room air. Respiratory system: Clear to auscultation. Respiratory effort normal. Cardiovascular system: Rate controlled, no rubs, no gallops, no JVD. Gastrointestinal system: Abdomen is obese, nondistended, soft and nontender. No organomegaly or masses felt. Normal bowel sounds heard. Central nervous system: No focal neurological deficits. Extremities: No cyanosis or clubbing. Skin: No petechiae. Psychiatry: Judgement and insight appear normal. Mood & affect appropriate.    Condition at discharge: Stable and improved.  The results of significant diagnostics from this hospitalization (including imaging, microbiology, ancillary and laboratory) are listed below for reference.   Imaging Studies: CARDIAC CATHETERIZATION Result Date: 02/09/2023 Conclusions: Mild, nonobstructive coronary artery disease with 10 to 20% stenosis of the mid LAD.  Coronary arteries are  tortuous, suggestive of hypertensive heart disease. Mildly elevated left ventricular filling pressure (LVEDP 16 mmHg). Recommendations: Continue medical therapy and risk factor modification to prevent progression of disease. Resume losartan  and continue current doses of amlodipine  and metoprolol .  Suspect chest pain and mild troponin elevation reflect supply-demand mismatch in the setting of severely elevated blood pressure. Lynn Hanson, MD [facility]  ECHOCARDIOGRAM COMPLETE Result Date: 02/08/2023    ECHOCARDIOGRAM REPORT   Patient Name:   Lynn Estes Date of Exam: 02/08/2023 Medical Rec #:  [id number]          Height:       64.0 in Accession #:    [id number]         Weight:       199.0 lb Date of Birth:  1945/08/23           BSA:          1.952 m Patient Age:    77 years           BP:           162/80 mmHg Patient Gender: F                  HR:           70 bpm. Exam Location:  Inpatient Procedure: 2D Echo, Cardiac Doppler, Color Doppler and Intracardiac            Opacification Agent Indications:    NSTEMI  History:        Patient has prior history of Echocardiogram examinations, most                 recent 11/22/2016. Signs/Symptoms:Syncope and Chest Pain; Risk                 Factors:Hypertension and Sleep Apnea.  Sonographer:    Juanita Shaw Referring Phys: [id number] JONATHAN SEGARS IMPRESSIONS  1. Left ventricular ejection fraction, by estimation, is 65 to 70%. The left ventricle has normal function. The left ventricle has no regional wall motion abnormalities. There is mild left ventricular hypertrophy. Left ventricular diastolic parameters were normal.  2. Right ventricular systolic function is normal. The right ventricular size is normal.  3. The mitral valve is normal in structure. Trivial mitral valve regurgitation.  4. The aortic valve is tricuspid. Aortic valve regurgitation is not visualized. Aortic valve sclerosis/calcification is present, without any evidence of aortic stenosis.  5. The  inferior vena cava is normal in size with greater than 50% respiratory variability, suggesting right atrial pressure of 3 mmHg. FINDINGS  Left Ventricle: Left ventricular ejection fraction, by estimation, is 65 to 70%. The left ventricle has normal function. The left ventricle has no regional wall motion abnormalities. The left ventricular internal cavity size was normal in size. There is  mild left ventricular hypertrophy. Left ventricular diastolic parameters were normal. Right Ventricle: The right ventricular size is normal. Right vetricular wall thickness was not assessed. Right ventricular systolic function is normal. Left Atrium: Left atrial size was normal in size. Right Atrium: Right atrial size was normal in size. Pericardium: There is no evidence of pericardial effusion. Mitral Valve: The mitral valve is normal in structure. Trivial mitral valve regurgitation. MV peak gradient, 5.9 mmHg. The mean mitral valve gradient is 3.0 mmHg. Tricuspid Valve: The tricuspid valve is grossly normal. Tricuspid valve regurgitation is trivial. Aortic Valve: The aortic valve is tricuspid. Aortic valve regurgitation is not visualized. Aortic valve sclerosis/calcification is present, without any evidence of aortic stenosis. Aortic valve mean gradient measures 5.0 mmHg. Aortic valve peak gradient measures 7.2 mmHg. Aortic valve area, by VTI measures 3.61 cm. Pulmonic Valve: The pulmonic valve was normal in structure. Pulmonic valve regurgitation is mild. Aorta: The aortic root and ascending aorta are structurally normal, with no evidence of dilitation. Venous: The inferior vena cava is normal in size with greater than 50% respiratory variability, suggesting right atrial pressure of 3 mmHg. IAS/Shunts: No atrial level shunt detected by color flow Doppler.  LEFT VENTRICLE PLAX 2D LVIDd:         4.00 cm      Diastology LVIDs:         2.20 cm      LV e' medial:    11.20 cm/s LV PW:         1.20 cm      LV E/e' medial:  9.2 LV  IVS:        1.10 cm      LV e' lateral:   8.55 cm/s LVOT diam:     2.10 cm      LV E/e' lateral: 12.0 LV SV:         112 LV SV Index:   57 LVOT Area:     3.46 cm  LV Volumes (MOD) LV vol d, MOD A4C: 101.0 ml LV vol s, MOD A4C: 31.9 ml LV SV MOD A4C:     101.0 ml RIGHT VENTRICLE  IVC RV S prime:     15.40 cm/s  IVC diam: 1.40 cm TAPSE (M-mode): 2.9 cm LEFT ATRIUM             Index LA diam:        4.10 cm 2.10 cm/m LA Vol (A2C):   49.0 ml 25.10 ml/m LA Vol (A4C):   50.4 ml 25.82 ml/m LA Biplane Vol: 52.1 ml 26.69 ml/m  AORTIC VALVE                     PULMONIC VALVE AV Area (Vmax):    3.21 cm      PV Vmax:          0.76 m/s AV Area (Vmean):   2.84 cm      PV Peak grad:     2.3 mmHg AV Area (VTI):     3.61 cm      PR End Diast Vel: 1.70 msec AV Vmax:           134.00 cm/s AV Vmean:          107.000 cm/s AV VTI:            0.309 m AV Peak Grad:      7.2 mmHg AV Mean Grad:      5.0 mmHg LVOT Vmax:         124.00 cm/s LVOT Vmean:        87.600 cm/s LVOT VTI:          0.322 m LVOT/AV VTI ratio: 1.04  AORTA Ao Root diam: 3.40 cm Ao Asc diam:  3.70 cm MITRAL VALVE MV Area (PHT): 2.52 cm     SHUNTS MV Area VTI:   3.29 cm     Systemic VTI:  0.32 m MV Peak grad:  5.9 mmHg     Systemic Diam: 2.10 cm MV Mean grad:  3.0 mmHg MV Vmax:       1.21 m/s MV Vmean:      79.8 cm/s MV Decel Time: 301 msec MV E velocity: 103.00 cm/s MV A velocity: 113.00 cm/s MV E/A ratio:  0.91 Vina Gull MD Electronically signed by Vina Gull MD Signature Date/Time: 02/08/2023/3:22:39 PM    Final    DG Chest 2 View Result Date: 02/07/2023 CLINICAL DATA:  chest pain EXAM: CHEST - 2 VIEW COMPARISON:  Chest x-ray 08/22/2020, CT chest 08/03/2015 FINDINGS: The heart and mediastinal contours are unchanged. No focal consolidation. Chronic coarsened interstitial markings with no overt pulmonary edema. No pleural effusion. No pneumothorax. No acute osseous abnormality. IMPRESSION: No active cardiopulmonary disease. Electronically Signed   By:  Morgane  Naveau M.D.   On: 02/07/2023 22:33    Microbiology: No results found for this or any previous visit.  Labs: CBC: Recent Labs  Lab 02/07/23 2233 02/09/23 0429  WBC 10.4 8.7  HGB 13.9 12.9  HCT 40.8 36.7  MCV 89.5 87.8  PLT 329 267   Basic Metabolic Panel: Recent Labs  Lab 02/07/23 2233 02/08/23 0502  NA 137 129*  K 4.3 4.0  CL 97* 98  CO2 25 19*  GLUCOSE 85 109*  BUN 15 15  CREATININE 0.66 0.56  CALCIUM  9.3 8.6*  MG  --  2.0  PHOS  --  3.3   Liver Function Tests: Recent Labs  Lab 02/08/23 0502  AST 30  ALT 25  ALKPHOS 62  BILITOT 0.9  PROT 6.2*  ALBUMIN 3.6   CBG: No results for input(s): GLUCAP in the  last 168 hours.  Discharge time spent: greater than 30 minutes.  Signed: Eric Nunnery, MD [facility] 02/09/2023

## 2023-02-09 NOTE — H&P (View-Only) (Signed)
   Patient Name: Aarion Kittrell Date of Encounter: 02/09/2023 [facility] Cardiologist: None   Interval Summary  .    No chest pain Uncomfortable in ER bed Had nausea and vomiting last night, now resolved No abdominal pain  Vital Signs .    Vitals:   02/09/23 0530 02/09/23 0600 02/09/23 0630 02/09/23 0756  BP: (!) 145/67 (!) 109/96 (!) 142/69   Pulse: 79 73 74   Resp: 18 16 (!) 24   Temp:    98.2 F (36.8 C)  TempSrc:    Oral  SpO2: 94% 92% 97%   Weight:      Height:       No intake or output data in the 24 hours ending 02/09/23 0936    02/07/2023   10:13 PM 01/18/2023    4:06 PM 10/12/2022    4:08 PM  Last 3 Weights  Weight (lbs) 199 lb 203 lb 200 lb 6.4 oz  Weight (kg) 90.266 kg 92.08 kg 90.901 kg      Telemetry/ECG    Telemetry 02/09/2023: No arrhythmia  Physical Exam .   Physical Exam Vitals and nursing note reviewed.  Constitutional:      General: She is not in acute distress. Neck:     Vascular: No JVD.  Cardiovascular:     Rate and Rhythm: Normal rate and regular rhythm.     Heart sounds: Normal heart sounds. No murmur heard. Pulmonary:     Effort: Pulmonary effort is normal.     Breath sounds: Normal breath sounds. No wheezing or rales.      Assessment & Plan .     78 year old female with hypertension, COPD, asthma, admitted for chest pain, shortness of breath, elevated troponin.   Chest pain, dyspnea, elevated troponin: Differentials include hypertensive urgency with type II MI or true type I MI. Echocardiogram with no RV systolic dysfunction.  PE less likely. Currently chest pain-free. Continue aspirin , heparin .  Plan for cath today. Continue metoprolol  succinate 25 mg daily, amlodipine  10 mg daily. Holding losartan  with plans for catheter today.  Can resume after cath, along with her chlorthalidone. At that point, amlodipine  can be weaned down, as necessary. LDL 82 in the setting of NSTEMI.  Added Crestor  10  mg  Hypertension: As above. Resume losartan /hydrochlorothiazide  after cath, at which point, amlodipine  can be weaned off.    For questions or updates, please contact [facility] Please consult [email or web address] for contact info under        Signed, Newman JINNY Lawrence, MD

## 2023-02-09 NOTE — ED Notes (Signed)
 Pharmacy called to discuss heparin  level.

## 2023-02-09 NOTE — Progress Notes (Signed)
 PHARMACY - ANTICOAGULATION CONSULT NOTE  Pharmacy Consult for IV heparin  Indication: chest pain/ACS  No Known Allergies  Patient Measurements: Height: 5' 4 (162.6 cm) Weight: 90.3 kg (199 lb) IBW/kg (Calculated) : 54.7 Heparin  Dosing Weight: 74.9 kg  Vital Signs: Temp: 98.2 F (36.8 C) (02/06 0756) Temp Source: Oral (02/06 0756) BP: 142/69 (02/06 0630) Pulse Rate: 74 (02/06 0630)  Labs: Recent Labs    02/07/23 2233 02/08/23 0044 02/08/23 0502 02/08/23 0839 02/08/23 1207 02/08/23 2349 02/09/23 0429 02/09/23 0853  HGB 13.9  --   --   --   --   --  12.9  --   HCT 40.8  --   --   --   --   --  36.7  --   PLT 329  --   --   --   --   --  267  --   LABPROT  --   --  13.6  --   --   --   --   --   INR  --   --  1.0  --   --   --   --   --   HEPARINUNFRC  --   --   --   --  <0.10* <0.10*  --  0.22*  CREATININE 0.66  --  0.56  --   --   --   --   --   TROPONINIHS 48* 132*  --  165*  --   --   --   --     Estimated Creatinine Clearance: 65.1 mL/min (by C-G formula based on SCr of 0.56 mg/dL).   Medical History: Past Medical History:  Diagnosis Date   Arthritis    generalized-bilateral hands   Asthma    hx of   Blepharitis of both eyes    Cataract    bilateral-LEFT eye   COPD (chronic obstructive pulmonary disease) (HCC)    not being tx'd at this time (03/23/2020)   Diverticulitis    History of shingles    Hypertension    on meds   Lung nodules    Nodules noted on CT scan July 2017   OSA (obstructive sleep apnea)    Partial small bowel obstruction (HCC)     Assessment: Lynn Estes is a 78 y.o. year old female admitted on 02/07/2023 with concern for ACS. No anticoagulation prior to admission. Pharmacy consulted to dose heparin .  Heparin  level subtherapeutic: 0.22 on 1400 units/hr, no issues with infusion or overt s/sx of bleeding per RN.    Goal of Therapy:  Heparin  level 0.3-0.7 units/ml Monitor platelets by anticoagulation protocol: Yes   Plan:   Heparin  1000 units re-bolus Increase heparin  infusion to 1600 units/hr Heparin  level in 8 hours  Koren Or, PharmD Clinical Pharmacist 02/09/2023 9:33 AM Please check AMION for all [facility] Pharmacy numbers

## 2023-02-10 ENCOUNTER — Telehealth: Payer: Self-pay

## 2023-02-10 ENCOUNTER — Encounter (HOSPITAL_COMMUNITY): Payer: Self-pay | Admitting: Internal Medicine

## 2023-02-10 NOTE — Transitions of Care (Post Inpatient/ED Visit) (Signed)
 02/10/2023  Name: Lynn Estes MRN: [id number] DOB: 01-Oct-1945  Today's TOC FU Call Status: Today's TOC FU Call Status:: Successful TOC FU Call Completed TOC FU Call Complete Date: 02/10/23 Patient's Name and Date of Birth confirmed.  Transition Care Management Follow-up Telephone Call Date of Discharge: 02/09/23 Discharge Facility: [facility]) Type of Discharge: Inpatient Admission Primary Inpatient Discharge Diagnosis:: NSTEMI How have you been since you were released from the hospital?: Better Any questions or concerns?: No  Items Reviewed: Did you receive and understand the discharge instructions provided?: Yes Medications obtained,verified, and reconciled?: Yes (Medications Reviewed) Any new allergies since your discharge?: No Dietary orders reviewed?: Yes Do you have support at home?: Yes People in Home: spouse  Medications Reviewed Today: Medications Reviewed Today     Reviewed by Emmitt Pan, LPN (Licensed Practical Nurse) on 02/10/23 at [id number]  Med List Status: <None>   Medication Order Taking? Sig Documenting Provider Last Dose Status Informant  amLODipine  (NORVASC ) 10 MG tablet [id number]  Take 1 tablet (10 mg total) by mouth daily. Ricky Fines, MD  Active   calcium  carbonate (OS-CAL) 600 MG tablet [id number] No Take 600 mg by mouth daily. [provider] 02/07/2023 Morning Active Self  Cholecalciferol (VITAMIN D  PO) [id number] No Take 2,000 Units by mouth daily. [provider] 02/07/2023 Morning Active Self  clobetasol (TEMOVATE) 0.05 % external solution [id number] No Apply 1 Application topically daily. [provider] 02/07/2023 Morning Active Self  clotrimazole  (LOTRIMIN ) 1 % cream [id number]  Apply 1 Application topically daily as needed (for rash). Ricky Fines, MD  Active   Cyanocobalamin (VITAMIN B 12 PO) [id number] No Take 1 tablet by mouth daily. [provider] 02/07/2023 Morning Active Self  fluticasone  (FLONASE ) 50  MCG/ACT nasal spray [id number] No Place 2 sprays into both nostrils daily.  Patient taking differently: Place 2 sprays into both nostrils daily as needed (for congestion).   Tobie Arleta SQUIBB, MD Past Month Active Self  incobotulinumtoxinA  (XEOMIN ) 50 units injection 50 Units [id number]   Onita Duos, MD  Active   incobotulinumtoxinA  (XEOMIN ) 50 units injection 50 Units [id number]   Onita Duos, MD  Active   losartan  (COZAAR ) 100 MG tablet [id number]  Take 1 tablet (100 mg total) by mouth daily. Ricky Fines, MD  Active   metoprolol  succinate (TOPROL -XL) 25 MG 24 hr tablet [id number]  Take 1 tablet (25 mg total) by mouth at bedtime. Ricky Fines, MD  Active   Omega-3 Fatty Acids (FISH OIL) 1000 MG CAPS [id number] No Take 1,000 mg by mouth daily. [provider] 02/07/2023 Morning Active Self  rosuvastatin  (CRESTOR ) 10 MG tablet [id number]  Take 1 tablet (10 mg total) by mouth daily. Ricky Fines, MD  Active   TURMERIC PO [id number] No Take 1 capsule by mouth daily. [provider] Past Month Active Self            Home Care and Equipment/Supplies: Were Home Health Services Ordered?: NA Any new equipment or medical supplies ordered?: NA  Functional Questionnaire: Do you need assistance with bathing/showering or dressing?: No Do you need assistance with meal preparation?: No Do you need assistance with eating?: No Do you have difficulty maintaining continence: No Do you need assistance with getting out of bed/getting out of a chair/moving?: No Do you have difficulty managing or taking your medications?: No  Follow up appointments reviewed: PCP Follow-up appointment confirmed?: Yes Date of PCP follow-up appointment?: 02/13/23 Follow-up Provider: [facility] Follow-up appointment confirmed?: No Reason Specialist Follow-Up  Not Confirmed: Patient has Specialist Provider Number and will Call for Appointment Do you need transportation to your follow-up  appointment?: No Do you understand care options if your condition(s) worsen?: Yes-patient verbalized understanding    SIGNATURE Julian Lemmings, LPN [facility] Nurse Health Advisor Direct Dial [phone]

## 2023-02-20 ENCOUNTER — Ambulatory Visit (INDEPENDENT_AMBULATORY_CARE_PROVIDER_SITE_OTHER): Payer: Medicare Other | Admitting: Family Medicine

## 2023-02-20 ENCOUNTER — Inpatient Hospital Stay: Payer: Medicare Other | Admitting: Family Medicine

## 2023-02-20 ENCOUNTER — Encounter: Payer: Self-pay | Admitting: Family Medicine

## 2023-02-20 VITALS — BP 124/72 | HR 69 | Temp 97.7°F | Ht 64.0 in | Wt 199.0 lb

## 2023-02-20 DIAGNOSIS — I214 Non-ST elevation (NSTEMI) myocardial infarction: Secondary | ICD-10-CM | POA: Diagnosis not present

## 2023-02-20 DIAGNOSIS — R079 Chest pain, unspecified: Secondary | ICD-10-CM | POA: Diagnosis not present

## 2023-02-20 DIAGNOSIS — I2510 Atherosclerotic heart disease of native coronary artery without angina pectoris: Secondary | ICD-10-CM

## 2023-02-20 DIAGNOSIS — I10 Essential (primary) hypertension: Secondary | ICD-10-CM

## 2023-02-20 NOTE — Progress Notes (Signed)
Subjective:    Patient ID: Lynn Estes, female    DOB: 05-18-1945, 78 y.o.   MRN: [id number]  HPI Admit date:     02/07/2023  Discharge date: 02/09/23  Discharge Physician: Vassie Loll    PCP: Donita Brooks, MD     Discharge Diagnoses: Principal Problem:   NSTEMI (non-ST elevated myocardial infarction) [facility]) Active Problems:   Hypertensive urgency   Brief Hospital admission course: As per H&P written by Dr. Lazarus Salines on 02/08/2023 Lynn Estes is a 78 y.o. female with hx of asthma, hypertension, OSA, who presented after an episode of chest pain.  Reports while leaving dinner had worsening dyspnea with minimal exertion walking to the car. Then once home had episode of chest pain from her central chest wrapping under the R breast. Associated with lightheadedness, flushing, nausea. Pain lasted about 30 minutes.  She took her blood pressure on this time and it was elevated in the 180s systolic. Took an aspirin at home. Had resolved just before coming to the ED. No active chest pain at present.  Today also noted swelling in both ankles.  No orthopnea.   Otherwise reports over the past 6 months or so she has had dyspnea on exertion after walking up 6 - 10 minutes.  Denies any exertional chest pain with this amount of activity.    Assessment and Plan: 1-chest pain/NSTEMI -Patient with mild elevation in troponins along with chest pain -Demand ischemia versus possible ischemic process was suspected -2D echo not demonstrating wall motion abnormalities and ejection fraction was preserved. -Cardiac cath demonstrated nonobstructive coronary artery disease and risk factor modification and good blood pressure control has been recommended by cardiology. Diagnostic Left Main  Vessel is large. Vessel is angiographically normal. Short LMCA, functionally with separate ostia of LAD and LCx.    Left Anterior Descending  Vessel is large.  Mid LAD-1 lesion is 15% stenosed.  Mid LAD-2 lesion  is 15% stenosed.    First Diagonal Branch  Vessel is moderate in size.    Second Diagonal Branch  Vessel is large in size.    Left Circumflex  Vessel is large.    First Obtuse Marginal Branch  Vessel is moderate in size.    Second Obtuse Marginal Branch  Vessel is small in size.    Third Obtuse Marginal Branch  Vessel is small in size.    Fourth Obtuse Marginal Branch  Vessel is large in size.    Right Coronary Artery  Vessel is large. Vessel is angiographically normal.    Right Posterior Descending Artery  Vessel is large in size.    Right Posterior Atrioventricular Artery  Vessel is moderate in size.    First Right Posterolateral Branch  Vessel is small in size.    Second Right Posterolateral Branch  Vessel is small in size.     -Patient has been discharged on Cozaar and amlodipine -Low-sodium diet has been recommended. -Continue baby aspirin, statin and metoprolol on daily basis.   2-hypertensive urgency  -Present at time of admission -Low-sodium diet discussed with patient -Maintain good compliance with losartan, amlodipine and metoprolol.     3-obstructive sleep apnea -Continue using CPAP -Further outpatient evaluation recommended.   4-history of asthma -No acute exacerbation appreciated -Continue as needed bronchodilator management.   5-hyperlipidemia -Continue statin.   02/20/23 Patient states that she was walking around her house when suddenly she developed nausea and sternal chest pain that radiated into her right breast around her right breast and into  her right flank.  Her blood pressure was extremely high prompting her to go to the emergency room.  In the emergency room her troponins trended upward from 43-1 130-160 prompting her catheterization which revealed nonobstructive coronary artery disease.  Elevated troponin was attributed to strain secondary to hypertension.  Possible vasospasm would be another explanation.  Patient continues to  report some shortness of breath and cough. Past Medical History:  Diagnosis Date   Arthritis    generalized-bilateral hands   Asthma    hx of   Blepharitis of both eyes    Cataract    bilateral-LEFT eye   COPD (chronic obstructive pulmonary disease) (HCC)    not being tx'd at this time (03/23/2020)   Diverticulitis    History of shingles    Hypertension    on meds   Lung nodules    Nodules noted on CT scan July 2017   OSA (obstructive sleep apnea)    Partial small bowel obstruction [facility])    Past Surgical History:  Procedure Laterality Date   ANKLE SURGERY  2019   CATARACT EXTRACTION W/PHACO Left 06/08/2015   Procedure: CATARACT EXTRACTION PHACO AND INTRAOCULAR LENS PLACEMENT LEFT EYE CDE=7.18;  Surgeon: Susa Simmonds, MD;  Location: [facility];  Service: Ophthalmology;  Laterality: Left;   DILATION AND CURETTAGE OF UTERUS     X 2   EXERCISE TOLERANCE TEST (GXT)  11/2016   Low Exercise Tolerance - 6:24 min (dyspnea, fatigue), upsloping ST segment depression noted.  Nondiagnostic.  HR 150 bpm = 97% MPHR). Hypertensive response to exercise. NEGATIVE test.  No arrhythmias.   LEFT HEART CATH AND CORONARY ANGIOGRAPHY N/A 02/09/2023   Procedure: LEFT HEART CATH AND CORONARY ANGIOGRAPHY;  Surgeon: Yvonne Kendall, MD;  Location: [facility] INVASIVE CV LAB;  Service: Cardiovascular;  Laterality: N/A;   right rotator cuff surgery     TRANSTHORACIC ECHOCARDIOGRAM  11/2016   Normal LV size and function.  GR 1 DD.  Moderate concentric hypertrophy.  Vigorous LV function with EF of 65-70%.   WISDOM TOOTH EXTRACTION     Current Outpatient Medications on File Prior to Visit  Medication Sig Dispense Refill   amLODipine (NORVASC) 10 MG tablet Take 1 tablet (10 mg total) by mouth daily. 30 tablet 2   calcium carbonate (OS-CAL) 600 MG tablet Take 600 mg by mouth daily.     Cholecalciferol (VITAMIN D PO) Take 2,000 Units by mouth daily.     clobetasol (TEMOVATE) 0.05 % external solution Apply 1 Application  topically daily.     clotrimazole (LOTRIMIN) 1 % cream Apply 1 Application topically daily as needed (for rash).     Cyanocobalamin (VITAMIN B 12 PO) Take 1 tablet by mouth daily.     fluticasone (FLONASE) 50 MCG/ACT nasal spray Place 2 sprays into both nostrils daily. (Patient taking differently: Place 2 sprays into both nostrils daily as needed (for congestion).) 16 g 5   losartan (COZAAR) 100 MG tablet Take 1 tablet (100 mg total) by mouth daily. 30 tablet 2   metoprolol succinate (TOPROL-XL) 25 MG 24 hr tablet Take 1 tablet (25 mg total) by mouth at bedtime. 30 tablet 2   Omega-3 Fatty Acids (FISH OIL) 1000 MG CAPS Take 1,000 mg by mouth daily.     rosuvastatin (CRESTOR) 10 MG tablet Take 1 tablet (10 mg total) by mouth daily. 30 tablet 1   TURMERIC PO Take 1 capsule by mouth daily.     Current Facility-Administered Medications on File Prior to  Visit  Medication Dose Route Frequency Provider Last Rate Last Admin   incobotulinumtoxinA (XEOMIN) 50 units injection 50 Units  50 Units Intramuscular Q90 days    50 Units at 10/17/22 1101   incobotulinumtoxinA (XEOMIN) 50 units injection 50 Units  50 Units Intramuscular Q90 days Levert Feinstein, MD   50 Units at 01/29/23 2115   No Known Allergies Social History   Socioeconomic History   Marital status: Married    Spouse name: Alinda Money   Number of children: 2   Years of education: 13   Highest education level: Not on file  Occupational History   Occupation: Advertising account planner    Comment: Event organiser  Tobacco Use   Smoking status: Never    Passive exposure: Past   Smokeless tobacco: Never  Vaping Use   Vaping status: Never Used  Substance and Sexual Activity   Alcohol use: Yes    Alcohol/week: 14.0 standard drinks of alcohol    Types: 14 Standard drinks or equivalent per week    Comment: Weekly   Drug use: No   Sexual activity: Yes  Other Topics Concern   Not on file  Social History Narrative   PFT---08-20-2009   fev1-2.04, 92%,  fev1/fvc 0.79   Widowed, remarried.  2 kids   Works part time Customer service manager       Lives with husband   Right handed   Caffeine: 2 cups of coffee in the AM   Social Drivers of Health   Financial Resource Strain: Not on file  Food Insecurity: Not on file  Transportation Needs: Not on file  Physical Activity: Sufficiently Active (11/24/2016)   Exercise Vital Sign    Days of Exercise per Week: 5 days    Minutes of Exercise per Session: 40 min  Stress: Not on file  Social Connections: Not on file  Intimate Partner Violence: Not on file   Family History  Problem Relation Age of Onset   Hypertension Brother    Stroke Maternal Grandmother    Colon polyps Neg Hx    Colon cancer Neg Hx    Esophageal cancer Neg Hx    Rectal cancer Neg Hx    Stomach cancer Neg Hx       Review of Systems  All other systems reviewed and are negative.      Objective:   Physical Exam Vitals reviewed.  Constitutional:      General: She is not in acute distress.    Appearance: She is well-developed. She is not diaphoretic.  HENT:     Head: Normocephalic and atraumatic.     Right Ear: External ear normal.     Left Ear: External ear normal.     Nose: Nose normal.     Mouth/Throat:     Pharynx: No oropharyngeal exudate.  Eyes:     General: Lids are normal. No scleral icterus.       Right eye: No discharge.        Left eye: No discharge.     Extraocular Movements: Extraocular movements intact.     Right eye: Normal extraocular motion.     Left eye: Normal extraocular motion.     Conjunctiva/sclera:     Right eye: Right conjunctiva is injected. No hemorrhage.    Left eye: Left conjunctiva is injected. No hemorrhage.    Pupils: Pupils are equal, round, and reactive to light.   Neck:     Thyroid: No thyromegaly.     Vascular: No JVD.  Trachea: No tracheal deviation.  Cardiovascular:     Rate and Rhythm: Normal rate and regular rhythm.     Heart sounds: Normal heart  sounds. No murmur heard.    No friction rub. No gallop.  Pulmonary:     Effort: Pulmonary effort is normal. No respiratory distress.     Breath sounds: Normal breath sounds. No stridor. No wheezing or rales.  Chest:     Chest wall: No tenderness.  Abdominal:     General: Bowel sounds are normal. There is no distension.     Palpations: Abdomen is soft. There is no mass.     Tenderness: There is no abdominal tenderness. There is no guarding or rebound.     Hernia: No hernia is present.  Musculoskeletal:     Cervical back: Normal range of motion.     Lumbar back: No spasms or tenderness. Normal range of motion.     Right lower leg: No edema.     Left lower leg: No edema.  Lymphadenopathy:     Cervical: No cervical adenopathy.  Skin:    General: Skin is warm.     Coloration: Skin is not jaundiced or pale.     Findings: No bruising, erythema, lesion or rash.  Neurological:     General: No focal deficit present.     Mental Status: She is oriented to person, place, and time. Mental status is at baseline.     Cranial Nerves: No cranial nerve deficit.     Sensory: No sensory deficit.     Motor: No weakness.     Coordination: Coordination normal.     Gait: Gait normal.     Deep Tendon Reflexes: Reflexes normal.  Psychiatric:        Mood and Affect: Mood normal.        Behavior: Behavior normal.        Thought Content: Thought content normal.        Judgment: Judgment normal.           Assessment & Plan:  Chest pain, unspecified type - Plan: D-dimer, quantitative  Benign essential HTN  Nonobstructive atherosclerosis of coronary artery Patient's blood pressure today is outstanding.  She is on Crestor because of her coronary artery disease to prevent progression.  She is on amlodipine which would also help prevent vasospasm.  Therefore, her medical condition is being treated optimally.  I will check a D-dimer to ensure that the troponin was not secondary to an undiagnosed  pulmonary embolism given the location atypical nature of the pain and her shortness of breath and coughing.  If D-dimer is positive I would recommend a CT scan of the chest to evaluate further.

## 2023-02-21 LAB — D-DIMER, QUANTITATIVE: D-Dimer, Quant: 0.25 ug{FEU}/mL (ref <0.50)

## 2023-04-05 ENCOUNTER — Ambulatory Visit: Payer: Self-pay | Admitting: Family Medicine

## 2023-04-05 NOTE — Telephone Encounter (Signed)
 Bilateral ankle swelling, worse on left ankle x1-2 weeks  Symptoms: Redness on top of both ankles and then "white where sock would be" Pertinent Negatives: Patient denies fever, difficulty breathing, chest pain, calf pain, pain  Disposition: [x] Appointment(In office)  Additional Notes: Pt states she shattered her left ankle about 4 years ago. Pt scheduled for appt with PCP on Friday, 4/4. This RN educated pt on home care, new-worsening symptoms, when to call back/seek emergent care. Pt verbalized understanding and agrees to plan.   Copied from CRM [id number]. Topic: Clinical - Red Word Triage >> Apr 05, 2023  1:13 PM Fredrica W wrote: Red Word that prompted transfer to Nurse Triage: Legs and feet swollen Reason for Disposition  MILD or MODERATE ankle swelling (e.g., can't move joint normally, can't do usual activities) (Exceptions: Itchy, localized swelling; swelling is chronic.)  Answer Assessment - Initial Assessment Questions 1. LOCATION: "Which ankle is swollen?" "Where is the swelling?"     Bilateral ankle swelling; worse on left ankle 2. ONSET: "When did the swelling start?"     1-2 weeks ago 3. SWELLING: "How bad is the swelling?" Or, "How large is it?" (e.g., mild, moderate, severe; size of localized swelling)    - NONE: No joint swelling.   - LOCALIZED: Localized; small area of puffy or swollen skin (e.g., insect bite, skin irritation).   - MILD: Joint looks or feels mildly swollen or puffy.   - MODERATE: Swollen; interferes with normal activities (e.g., work or school); decreased range of movement; may be limping.   - SEVERE: Very swollen; can't move swollen joint at all; limping a lot or unable to walk.     Mild-moderate 4. PAIN: "Is there any pain?" If Yes, ask: "How bad is it?" (Scale 1-10; or mild, moderate, severe)   - NONE (0): no pain.   - MILD (1-3): doesn't interfere with normal activities.    - MODERATE (4-7): interferes with normal activities (e.g., work or school) or  awakens from sleep, limping.    - SEVERE (8-10): excruciating pain, unable to do any normal activities, unable to walk.      Denies 5. CAUSE: "What do you think caused the ankle swelling?"     Pt wondering if the BP medications could be causing this 6. OTHER SYMPTOMS: "Do you have any other symptoms?" (e.g., fever, chest pain, difficulty breathing, calf pain)     Denies  Protocols used: Ankle Swelling-A-AH

## 2023-04-07 ENCOUNTER — Ambulatory Visit: Admitting: Family Medicine

## 2023-04-13 ENCOUNTER — Encounter: Payer: Self-pay | Admitting: Family Medicine

## 2023-04-13 ENCOUNTER — Ambulatory Visit: Admitting: Family Medicine

## 2023-04-13 VITALS — BP 122/80 | HR 72 | Temp 97.8°F | Ht 64.0 in | Wt 192.2 lb

## 2023-04-13 DIAGNOSIS — M7989 Other specified soft tissue disorders: Secondary | ICD-10-CM

## 2023-04-13 MED ORDER — HYDROCHLOROTHIAZIDE 25 MG PO TABS: 25.0000 mg | ORAL_TABLET | Freq: Every day | ORAL | 3 refills | Status: AC

## 2023-04-13 NOTE — Progress Notes (Signed)
 Subjective:    Patient ID: Lynn Estes, female    DOB: 17-Oct-1945, 78 y.o.   MRN: [id number]  HPI Admit date:     02/07/2023  Discharge date: 02/09/23  Discharge Physician: Vassie Loll    PCP: Donita Brooks, MD     Discharge Diagnoses: Principal Problem:   NSTEMI (non-ST elevated myocardial infarction) [facility]) Active Problems:   Hypertensive urgency   Brief Hospital admission course: As per H&P written by Dr. Lazarus Salines on 02/08/2023 Lynn Estes is a 78 y.o. female with hx of asthma, hypertension, OSA, who presented after an episode of chest pain.  Reports while leaving dinner had worsening dyspnea with minimal exertion walking to the car. Then once home had episode of chest pain from her central chest wrapping under the R breast. Associated with lightheadedness, flushing, nausea. Pain lasted about 30 minutes.  She took her blood pressure on this time and it was elevated in the 180s systolic. Took an aspirin at home. Had resolved just before coming to the ED. No active chest pain at present.  Today also noted swelling in both ankles.  No orthopnea.   Otherwise reports over the past 6 months or so she has had dyspnea on exertion after walking up 6 - 10 minutes.  Denies any exertional chest pain with this amount of activity.    Assessment and Plan: 1-chest pain/NSTEMI -Patient with mild elevation in troponins along with chest pain -Demand ischemia versus possible ischemic process was suspected -2D echo not demonstrating wall motion abnormalities and ejection fraction was preserved. -Cardiac cath demonstrated nonobstructive coronary artery disease and risk factor modification and good blood pressure control has been recommended by cardiology. Diagnostic Left Main  Vessel is large. Vessel is angiographically normal. Short LMCA, functionally with separate ostia of LAD and LCx.    Left Anterior Descending  Vessel is large.  Mid LAD-1 lesion is 15% stenosed.  Mid LAD-2 lesion  is 15% stenosed.    First Diagonal Branch  Vessel is moderate in size.    Second Diagonal Branch  Vessel is large in size.    Left Circumflex  Vessel is large.    First Obtuse Marginal Branch  Vessel is moderate in size.    Second Obtuse Marginal Branch  Vessel is small in size.    Third Obtuse Marginal Branch  Vessel is small in size.    Fourth Obtuse Marginal Branch  Vessel is large in size.    Right Coronary Artery  Vessel is large. Vessel is angiographically normal.    Right Posterior Descending Artery  Vessel is large in size.    Right Posterior Atrioventricular Artery  Vessel is moderate in size.    First Right Posterolateral Branch  Vessel is small in size.    Second Right Posterolateral Branch  Vessel is small in size.     -Patient has been discharged on Cozaar and amlodipine -Low-sodium diet has been recommended. -Continue baby aspirin, statin and metoprolol on daily basis.   2-hypertensive urgency  -Present at time of admission -Low-sodium diet discussed with patient -Maintain good compliance with losartan, amlodipine and metoprolol.     3-obstructive sleep apnea -Continue using CPAP -Further outpatient evaluation recommended.   4-history of asthma -No acute exacerbation appreciated -Continue as needed bronchodilator management.   5-hyperlipidemia -Continue statin.   02/20/23 Patient states that she was walking around her house when suddenly she developed nausea and sternal chest pain that radiated into her right breast around her right breast and into  her right flank.  Her blood pressure was extremely high prompting her to go to the emergency room.  In the emergency room her troponins trended upward from 43-1 130-160 prompting her catheterization which revealed nonobstructive coronary artery disease.  Elevated troponin was attributed to strain secondary to hypertension.  Possible vasospasm would be another explanation.  Patient continues to  report some shortness of breath and cough.  04/13/23 Ever since the patient started amlodipine, she reports +1 edema in both legs up to her mid shin.  She denies any chest pain or shortness of breath.  She is worried about the swelling.  She states that it is uncomfortable.  Her blood pressure today is outstanding Past Medical History:  Diagnosis Date  . Arthritis    generalized-bilateral hands  . Asthma    hx of  . Blepharitis of both eyes   . Cataract    bilateral-LEFT eye  . COPD (chronic obstructive pulmonary disease) (HCC)    not being tx'd at this time (03/23/2020)  . Diverticulitis   . History of shingles   . Hypertension    on meds  . Lung nodules    Nodules noted on CT scan July 2017  . OSA (obstructive sleep apnea)   . Partial small bowel obstruction [facility])    Past Surgical History:  Procedure Laterality Date  . ANKLE SURGERY  2019  . CATARACT EXTRACTION W/PHACO Left 06/08/2015   Procedure: CATARACT EXTRACTION PHACO AND INTRAOCULAR LENS PLACEMENT LEFT EYE CDE=7.18;  Surgeon: Susa Simmonds, MD;  Location: [facility];  Service: Ophthalmology;  Laterality: Left;  . DILATION AND CURETTAGE OF UTERUS     X 2  . EXERCISE TOLERANCE TEST (GXT)  11/2016   Low Exercise Tolerance - 6:24 min (dyspnea, fatigue), upsloping ST segment depression noted.  Nondiagnostic.  HR 150 bpm = 97% MPHR). Hypertensive response to exercise. NEGATIVE test.  No arrhythmias.  Marland Kitchen LEFT HEART CATH AND CORONARY ANGIOGRAPHY N/A 02/09/2023   Procedure: LEFT HEART CATH AND CORONARY ANGIOGRAPHY;  Surgeon: Yvonne Kendall, MD;  Location: [facility] INVASIVE CV LAB;  Service: Cardiovascular;  Laterality: N/A;  . right rotator cuff surgery    . TRANSTHORACIC ECHOCARDIOGRAM  11/2016   Normal LV size and function.  GR 1 DD.  Moderate concentric hypertrophy.  Vigorous LV function with EF of 65-70%.  . WISDOM TOOTH EXTRACTION     Current Outpatient Medications on File Prior to Visit  Medication Sig Dispense Refill  .  amLODipine (NORVASC) 10 MG tablet Take 1 tablet (10 mg total) by mouth daily. 30 tablet 2  . calcium carbonate (OS-CAL) 600 MG tablet Take 600 mg by mouth daily.    . Cholecalciferol (VITAMIN D PO) Take 2,000 Units by mouth daily.    . clobetasol (TEMOVATE) 0.05 % external solution Apply 1 Application topically daily.    . clotrimazole (LOTRIMIN) 1 % cream Apply 1 Application topically daily as needed (for rash).    . Cyanocobalamin (VITAMIN B 12 PO) Take 1 tablet by mouth daily.    . fluticasone (FLONASE) 50 MCG/ACT nasal spray Place 2 sprays into both nostrils daily. (Patient taking differently: Place 2 sprays into both nostrils daily as needed (for congestion).) 16 g 5  . losartan (COZAAR) 100 MG tablet Take 1 tablet (100 mg total) by mouth daily. 30 tablet 2  . metoprolol succinate (TOPROL-XL) 25 MG 24 hr tablet Take 1 tablet (25 mg total) by mouth at bedtime. 30 tablet 2  . Omega-3 Fatty Acids (FISH OIL)  1000 MG CAPS Take 1,000 mg by mouth daily.    . rosuvastatin (CRESTOR) 10 MG tablet Take 1 tablet (10 mg total) by mouth daily. 30 tablet 1  . TURMERIC PO Take 1 capsule by mouth daily.     No current facility-administered medications on file prior to visit.   No Known Allergies Social History   Socioeconomic History  . Marital status: Married    Spouse name: Alinda Money  . Number of children: 2  . Years of education: 87  . Highest education level: Not on file  Occupational History  . Occupation: Advertising account planner    Comment: Event organiser  Tobacco Use  . Smoking status: Never    Passive exposure: Past  . Smokeless tobacco: Never  Vaping Use  . Vaping status: Never Used  Substance and Sexual Activity  . Alcohol use: Yes    Alcohol/week: 14.0 standard drinks of alcohol    Types: 14 Standard drinks or equivalent per week    Comment: Weekly  . Drug use: No  . Sexual activity: Yes  Other Topics Concern  . Not on file  Social History Narrative   PFT---08-20-2009   fev1-2.04,  92%, fev1/fvc 0.79   Widowed, remarried.  2 kids   Works part time Customer service manager       Lives with husband   Right handed   Caffeine: 2 cups of coffee in the AM   Social Drivers of Health   Financial Resource Strain: Not on file  Food Insecurity: Not on file  Transportation Needs: Not on file  Physical Activity: Sufficiently Active (11/24/2016)   Exercise Vital Sign   . Days of Exercise per Week: 5 days   . Minutes of Exercise per Session: 40 min  Stress: Not on file  Social Connections: Not on file  Intimate Partner Violence: Not on file   Family History  Problem Relation Age of Onset  . Hypertension Brother   . Stroke Maternal Grandmother   . Colon polyps Neg Hx   . Colon cancer Neg Hx   . Esophageal cancer Neg Hx   . Rectal cancer Neg Hx   . Stomach cancer Neg Hx       Review of Systems  All other systems reviewed and are negative.      Objective:   Physical Exam Vitals reviewed.  Constitutional:      General: She is not in acute distress.    Appearance: She is well-developed. She is not diaphoretic.  HENT:     Head: Normocephalic and atraumatic.     Right Ear: External ear normal.     Left Ear: External ear normal.     Nose: Nose normal.     Mouth/Throat:     Pharynx: No oropharyngeal exudate.  Eyes:     General: Lids are normal. No scleral icterus.       Right eye: No discharge.        Left eye: No discharge.     Extraocular Movements: Extraocular movements intact.     Right eye: Normal extraocular motion.     Left eye: Normal extraocular motion.     Conjunctiva/sclera:     Right eye: Right conjunctiva is injected. No hemorrhage.    Left eye: Left conjunctiva is injected. No hemorrhage.    Pupils: Pupils are equal, round, and reactive to light.   Neck:     Thyroid: No thyromegaly.     Vascular: No JVD.     Trachea: No tracheal  deviation.  Cardiovascular:     Rate and Rhythm: Normal rate and regular rhythm.     Heart sounds:  Normal heart sounds. No murmur heard.    No friction rub. No gallop.  Pulmonary:     Effort: Pulmonary effort is normal. No respiratory distress.     Breath sounds: Normal breath sounds. No stridor. No wheezing or rales.  Chest:     Chest wall: No tenderness.  Abdominal:     General: Bowel sounds are normal. There is no distension.     Palpations: Abdomen is soft. There is no mass.     Tenderness: There is no abdominal tenderness. There is no guarding or rebound.     Hernia: No hernia is present.  Musculoskeletal:     Cervical back: Normal range of motion.     Lumbar back: No spasms or tenderness. Normal range of motion.     Right lower leg: Edema present.     Left lower leg: Edema present.  Lymphadenopathy:     Cervical: No cervical adenopathy.  Skin:    General: Skin is warm.     Coloration: Skin is not jaundiced or pale.     Findings: No bruising, erythema, lesion or rash.  Neurological:     General: No focal deficit present.     Mental Status: She is oriented to person, place, and time. Mental status is at baseline.     Cranial Nerves: No cranial nerve deficit.     Sensory: No sensory deficit.     Motor: No weakness.     Coordination: Coordination normal.     Gait: Gait normal.     Deep Tendon Reflexes: Reflexes normal.  Psychiatric:        Mood and Affect: Mood normal.        Behavior: Behavior normal.        Thought Content: Thought content normal.        Judgment: Judgment normal.          Assessment & Plan:  Leg swelling I believe the leg swelling is almost certainly due to the amlodipine.  Patient does not like the way that medication makes her feel.  Discontinue amlodipine.  Monitor blood pressure at home.  If blood pressure rises above 140/90, start hydrochlorothiazide 25 mg daily in addition to the losartan and metoprolol

## 2023-04-14 ENCOUNTER — Other Ambulatory Visit: Payer: Self-pay | Admitting: Family Medicine

## 2023-04-14 NOTE — Telephone Encounter (Signed)
 Requested medication (s) are due for refill today - yes  Requested medication (s) are on the active medication list -yes  Future visit scheduled -yes  Last refill: 02/10/23 #30 1RF  Notes to clinic: outside provider- sent for review   Requested Prescriptions  Pending Prescriptions Disp Refills   rosuvastatin (CRESTOR) 10 MG tablet [Pharmacy Med Name: Rosuvastatin Calcium Oral Tablet 10 MG] 30 tablet 0    Sig: Take 1 tablet (10 mg total) by mouth daily.     Cardiovascular:  Antilipid - Statins 2 Failed - 04/14/2023  4:19 PM      Failed - Lipid Panel in normal range within the last 12 months    Cholesterol  Date Value Ref Range Status  02/08/2023 164 0 - 200 mg/dL Final   LDL Cholesterol (Calc)  Date Value Ref Range Status  04/29/2022 111 (H) mg/dL (calc) Final    Comment:    Reference range: <100 . Desirable range <100 mg/dL for primary prevention;   <70 mg/dL for patients with CHD or diabetic patients  with > or = 2 CHD risk factors. Marland Kitchen LDL-C is now calculated using the Martin-Hopkins  calculation, which is a validated novel method providing  better accuracy than the Friedewald equation in the  estimation of LDL-C.  Horald Pollen et al. Lenox Ahr. 1610;960(45): 2061-2068  (http://education.[facility].com/faq/FAQ164)    LDL Cholesterol  Date Value Ref Range Status  02/08/2023 82 0 - 99 mg/dL Final    Comment:           Total Cholesterol/HDL:CHD Risk Coronary Heart Disease Risk Table                     Men   Women  1/2 Average Risk   3.4   3.3  Average Risk       5.0   4.4  2 X Average Risk   9.6   7.1  3 X Average Risk  23.4   11.0        Use the calculated Patient Ratio above and the CHD Risk Table to determine the patient's CHD Risk.        ATP III CLASSIFICATION (LDL):  <100     mg/dL   Optimal  409-811  mg/dL   Near or Above                    Optimal  130-159  mg/dL   Borderline  914-782  mg/dL   High  >956     mg/dL   Very High Performed at [facility], [address] [place], [place] [address]    HDL  Date Value Ref Range Status  02/08/2023 62 >40 mg/dL Final   Triglycerides  Date Value Ref Range Status  02/08/2023 100 <150 mg/dL Final         Passed - Cr in normal range and within 360 days    Creat  Date Value Ref Range Status  04/29/2022 0.62 0.60 - 1.00 mg/dL Final   Creatinine, Ser  Date Value Ref Range Status  02/08/2023 0.56 0.44 - 1.00 mg/dL Final         Passed - Patient is not pregnant      Passed - Valid encounter within last 12 months    Recent Outpatient Visits           Yesterday Leg swelling   [place] [facility] Donita Brooks, MD   1 month ago Chest  pain, unspecified type   [place] [facility] Tanya Nones, Priscille Heidelberg, MD   8 months ago Watery eyes   [place] [facility] Tanya Nones, Priscille Heidelberg, MD   11 months ago General medical exam   [place] [facility] Donita Brooks, MD   1 year ago Right sided sciatica   [place] [facility] Donita Brooks, MD                 Requested Prescriptions  Pending Prescriptions Disp Refills   rosuvastatin (CRESTOR) 10 MG tablet [Pharmacy Med Name: Rosuvastatin Calcium Oral Tablet 10 MG] 30 tablet 0    Sig: Take 1 tablet (10 mg total) by mouth daily.     Cardiovascular:  Antilipid - Statins 2 Failed - 04/14/2023  4:19 PM      Failed - Lipid Panel in normal range within the last 12 months    Cholesterol  Date Value Ref Range Status  02/08/2023 164 0 - 200 mg/dL Final   LDL Cholesterol (Calc)  Date Value Ref Range Status  04/29/2022 111 (H) mg/dL (calc) Final    Comment:    Reference range: <100 . Desirable range <100 mg/dL for primary prevention;   <70 mg/dL for patients with CHD or diabetic patients  with > or = 2 CHD risk factors. Marland Kitchen LDL-C is now calculated using the Martin-Hopkins  calculation, which is a validated novel method providing   better accuracy than the Friedewald equation in the  estimation of LDL-C.  Horald Pollen et al. Lenox Ahr. 4540;981(19): 2061-2068  (http://education.[facility].com/faq/FAQ164)    LDL Cholesterol  Date Value Ref Range Status  02/08/2023 82 0 - 99 mg/dL Final    Comment:           Total Cholesterol/HDL:CHD Risk Coronary Heart Disease Risk Table                     Men   Women  1/2 Average Risk   3.4   3.3  Average Risk       5.0   4.4  2 X Average Risk   9.6   7.1  3 X Average Risk  23.4   11.0        Use the calculated Patient Ratio above and the CHD Risk Table to determine the patient's CHD Risk.        ATP III CLASSIFICATION (LDL):  <100     mg/dL   Optimal  147-829  mg/dL   Near or Above                    Optimal  130-159  mg/dL   Borderline  562-130  mg/dL   High  >865     mg/dL   Very High Performed at [facility], [address], [place], [place] [address]    HDL  Date Value Ref Range Status  02/08/2023 62 >40 mg/dL Final   Triglycerides  Date Value Ref Range Status  02/08/2023 100 <150 mg/dL Final         Passed - Cr in normal range and within 360 days    Creat  Date Value Ref Range Status  04/29/2022 0.62 0.60 - 1.00 mg/dL Final   Creatinine, Ser  Date Value Ref Range Status  02/08/2023 0.56 0.44 - 1.00 mg/dL Final         Passed - Patient is not pregnant  Passed - Valid encounter within last 12 months    Recent Outpatient Visits           Yesterday Leg swelling   [place] [facility] Tanya Nones, Priscille Heidelberg, MD   1 month ago Chest pain, unspecified type   [place] [facility] Donita Brooks, MD   8 months ago Watery eyes   [place] [facility] Donita Brooks, MD   11 months ago General medical exam   [place] [facility] Donita Brooks, MD   1 year ago Right sided sciatica   [place] [facility] [place], Priscille Heidelberg, MD

## 2023-04-17 NOTE — Telephone Encounter (Signed)
 Copied from CRM [id number]. Topic: Clinical - Prescription Issue >> Apr 17, 2023 12:35 PM Zipporah Him wrote: Reason for CRM: Patient trying to get rosuvastatin (CRESTOR) 10 MG tablet filled but pharmacy stated it had the wrong doctors name listed at the prescriber. Should be refilled by Dr Cheril Cork. Patient is completely out of this medication, pharmacy provided her enough for the weekend but not for today. Please call patient with any issues as she is concerned about not having this medication for her blood pressure.

## 2023-04-20 ENCOUNTER — Ambulatory Visit: Payer: Medicare Other | Admitting: Neurology

## 2023-05-01 ENCOUNTER — Ambulatory Visit: Admitting: Family Medicine

## 2023-05-01 ENCOUNTER — Ambulatory Visit (INDEPENDENT_AMBULATORY_CARE_PROVIDER_SITE_OTHER): Admitting: Family Medicine

## 2023-05-01 ENCOUNTER — Encounter: Payer: Self-pay | Admitting: Family Medicine

## 2023-05-01 VITALS — BP 138/82 | HR 66 | Temp 97.6°F | Ht 64.0 in | Wt 196.6 lb

## 2023-05-01 DIAGNOSIS — M79641 Pain in right hand: Secondary | ICD-10-CM | POA: Diagnosis not present

## 2023-05-01 DIAGNOSIS — Z0000 Encounter for general adult medical examination without abnormal findings: Secondary | ICD-10-CM

## 2023-05-01 DIAGNOSIS — Z8639 Personal history of other endocrine, nutritional and metabolic disease: Secondary | ICD-10-CM | POA: Diagnosis not present

## 2023-05-01 DIAGNOSIS — M79642 Pain in left hand: Secondary | ICD-10-CM | POA: Diagnosis not present

## 2023-05-01 DIAGNOSIS — I2510 Atherosclerotic heart disease of native coronary artery without angina pectoris: Secondary | ICD-10-CM | POA: Diagnosis not present

## 2023-05-01 DIAGNOSIS — Z0001 Encounter for general adult medical examination with abnormal findings: Secondary | ICD-10-CM | POA: Diagnosis not present

## 2023-05-01 DIAGNOSIS — I10 Essential (primary) hypertension: Secondary | ICD-10-CM | POA: Diagnosis not present

## 2023-05-01 MED ORDER — TIRZEPATIDE-WEIGHT MANAGEMENT 2.5 MG/0.5ML ~~LOC~~ SOLN
2.5000 mg | SUBCUTANEOUS | 2 refills | Status: DC
Start: 2023-05-01 — End: 2023-09-14

## 2023-05-01 NOTE — Progress Notes (Signed)
 Subjective:    Patient ID: Lynn Estes, female    DOB: 11/27/1945, 78 y.o.   MRN: [id number]  HPI Patient is a very sweet 78 year old Caucasian female here today for a physical exam.  Patient had a colonoscopy in 2022 that showed severe diverticulosis.  Patient has a mammogram scheduled for later this week.  Patient does not require Pap smear due to being greater than 65.  Patient had a bone density test in November 2024 that showed a T-score of -0.4.  Therefore she does not have osteoporosis or even osteopenia.  Immunization records are listed below. Immunization History  Administered Date(s) Administered   Fluad Quad(high Dose 65+) 10/03/2018   Influenza Split 11/10/2011, 10/03/2013   Influenza, High Dose Seasonal PF 12/05/2017   Influenza,inj,Quad PF,6+ Mos 11/22/2012   Influenza,inj,quad, With Preservative 10/04/2018   PFIZER(Purple Top)SARS-COV-2 Vaccination 02/09/2019, 03/02/2019   Pneumococcal Conjugate-13 12/10/2014   Pneumococcal Polysaccharide-23 09/10/2011, 10/03/2012   Pneumococcal-Unspecified 01/03/2017   Tdap 08/29/2011   Patient appears to be due for a tetanus shot, Shingrix, COVID and flu each fall, as well as RSV vaccine.  Patient discontinued amlodipine  at her last visit due to swelling in her legs.  The swelling has subsided in her legs.  Her blood pressure remains acceptable at 138/82.  She has not been checking her blood pressure at home.  She denies any chest pain or shortness of breath or dyspnea on exertion.  She does have disfiguring in the hands primarily in the PIP and DIP joints.  This is suspicious for osteoarthritis however the patient complains about joint pains all over her body. Past Medical History:  Diagnosis Date   Arthritis    generalized-bilateral hands   Asthma    hx of   Blepharitis of both eyes    Cataract    bilateral-LEFT eye   COPD (chronic obstructive pulmonary disease) (HCC)    not being tx'd at this time (03/23/2020)    Diverticulitis    History of shingles    Hypertension    on meds   Lung nodules    Nodules noted on CT scan July 2017   OSA (obstructive sleep apnea)    Partial small bowel obstruction [facility])    Past Surgical History:  Procedure Laterality Date   ANKLE SURGERY  2019   CATARACT EXTRACTION W/PHACO Left 06/08/2015   Procedure: CATARACT EXTRACTION PHACO AND INTRAOCULAR LENS PLACEMENT LEFT EYE CDE=7.18;  Surgeon: Clay Cummins, MD;  Location: [facility];  Service: Ophthalmology;  Laterality: Left;   DILATION AND CURETTAGE OF UTERUS     X 2   EXERCISE TOLERANCE TEST (GXT)  11/2016   Low Exercise Tolerance - 6:24 min (dyspnea, fatigue), upsloping ST segment depression noted.  Nondiagnostic.  HR 150 bpm = 97% MPHR). Hypertensive response to exercise. NEGATIVE test.  No arrhythmias.   LEFT HEART CATH AND CORONARY ANGIOGRAPHY N/A 02/09/2023   Procedure: LEFT HEART CATH AND CORONARY ANGIOGRAPHY;  Surgeon: Sammy Crisp, MD;  Location: [facility];  Service: Cardiovascular;  Laterality: N/A;   right rotator cuff surgery     TRANSTHORACIC ECHOCARDIOGRAM  11/2016   Normal LV size and function.  GR 1 DD.  Moderate concentric hypertrophy.  Vigorous LV function with EF of 65-70%.   WISDOM TOOTH EXTRACTION     Current Outpatient Medications on File Prior to Visit  Medication Sig Dispense Refill   calcium  carbonate (OS-CAL) 600 MG tablet Take 600 mg by mouth daily.     Cholecalciferol (VITAMIN  D PO) Take 2,000 Units by mouth daily.     clobetasol (TEMOVATE) 0.05 % external solution Apply 1 Application topically daily.     clotrimazole  (LOTRIMIN ) 1 % cream Apply 1 Application topically daily as needed (for rash).     Cyanocobalamin (VITAMIN B 12 PO) Take 1 tablet by mouth daily.     fluticasone  (FLONASE ) 50 MCG/ACT nasal spray Place 2 sprays into both nostrils daily. (Patient taking differently: Place 2 sprays into both nostrils daily as needed (for congestion).) 16 g 5   losartan  (COZAAR ) 100 MG  tablet Take 1 tablet (100 mg total) by mouth daily. 30 tablet 2   metoprolol  succinate (TOPROL -XL) 25 MG 24 hr tablet Take 1 tablet (25 mg total) by mouth at bedtime. 30 tablet 2   Omega-3 Fatty Acids (FISH OIL) 1000 MG CAPS Take 1,000 mg by mouth daily.     rosuvastatin  (CRESTOR ) 10 MG tablet Take 1 tablet (10 mg total) by mouth daily. 30 tablet 0   TURMERIC PO Take 1 capsule by mouth daily.     hydrochlorothiazide  (HYDRODIURIL ) 25 MG tablet Take 1 tablet (25 mg total) by mouth daily. (Patient not taking: Reported on 05/01/2023) 90 tablet 3   No current facility-administered medications on file prior to visit.   No Known Allergies Social History   Socioeconomic History   Marital status: Married    Spouse name: Baker Bon   Number of children: 2   Years of education: 13   Highest education level: Not on file  Occupational History   Occupation: Advertising account planner    Comment: Event organiser  Tobacco Use   Smoking status: Never    Passive exposure: Past   Smokeless tobacco: Never  Vaping Use   Vaping status: Never Used  Substance and Sexual Activity   Alcohol use: Yes    Alcohol/week: 14.0 standard drinks of alcohol    Types: 14 Standard drinks or equivalent per week    Comment: Weekly   Drug use: No   Sexual activity: Yes  Other Topics Concern   Not on file  Social History Narrative   PFT---08-20-2009   fev1-2.04, 92%, fev1/fvc 0.79   Widowed, remarried.  2 kids   Works part time Customer service manager       Lives with husband   Right handed   Caffeine: 2 cups of coffee in the AM   Social Drivers of Health   Financial Resource Strain: Not on file  Food Insecurity: Not on file  Transportation Needs: Not on file  Physical Activity: Sufficiently Active (11/24/2016)   Exercise Vital Sign    Days of Exercise per Week: 5 days    Minutes of Exercise per Session: 40 min  Stress: Not on file  Social Connections: Not on file  Intimate Partner Violence: Not on file    Family History  Problem Relation Age of Onset   Hypertension Brother    Stroke Maternal Grandmother    Colon polyps Neg Hx    Colon cancer Neg Hx    Esophageal cancer Neg Hx    Rectal cancer Neg Hx    Stomach cancer Neg Hx       Review of Systems  All other systems reviewed and are negative.      Objective:   Physical Exam Vitals reviewed.  Constitutional:      General: She is not in acute distress.    Appearance: She is well-developed. She is not diaphoretic.  HENT:     Head: Normocephalic  and atraumatic.     Right Ear: Tympanic membrane, ear canal and external ear normal.     Left Ear: Tympanic membrane, ear canal and external ear normal.     Nose: Nose normal. No congestion or rhinorrhea.     Mouth/Throat:     Pharynx: No oropharyngeal exudate.  Eyes:     General: No scleral icterus.       Right eye: No discharge.        Left eye: No discharge.     Conjunctiva/sclera: Conjunctivae normal.     Pupils: Pupils are equal, round, and reactive to light.  Neck:     Thyroid : No thyromegaly.     Vascular: No JVD.     Trachea: No tracheal deviation.  Cardiovascular:     Rate and Rhythm: Normal rate and regular rhythm.     Heart sounds: Normal heart sounds. No murmur heard.    No friction rub. No gallop.  Pulmonary:     Effort: Pulmonary effort is normal. No respiratory distress.     Breath sounds: Normal breath sounds. No stridor. No wheezing or rales.  Chest:     Chest wall: No tenderness.  Abdominal:     General: Bowel sounds are normal. There is no distension.     Palpations: Abdomen is soft. There is no mass.     Tenderness: There is no abdominal tenderness. There is no guarding or rebound.     Hernia: No hernia is present.  Musculoskeletal:     Right hand: Deformity, tenderness and bony tenderness present. No swelling. Decreased range of motion. Decreased strength.     Left hand: Deformity, tenderness and bony tenderness present. No swelling. Decreased range  of motion. Decreased strength.     Cervical back: Normal range of motion.     Lumbar back: No spasms or tenderness. Normal range of motion.     Right lower leg: No edema.     Left lower leg: No edema.  Lymphadenopathy:     Cervical: No cervical adenopathy.  Skin:    General: Skin is warm.     Coloration: Skin is not jaundiced or pale.     Findings: No bruising, erythema, lesion or rash.  Neurological:     General: No focal deficit present.     Mental Status: She is oriented to person, place, and time. Mental status is at baseline.     Cranial Nerves: No cranial nerve deficit.     Sensory: No sensory deficit.     Motor: No weakness.     Coordination: Coordination normal.     Gait: Gait normal.     Deep Tendon Reflexes: Reflexes normal.  Psychiatric:        Mood and Affect: Mood normal.        Behavior: Behavior normal.        Thought Content: Thought content normal.        Judgment: Judgment normal.           Assessment & Plan:  Benign essential HTN - Plan: CBC with Differential/Platelet, COMPLETE METABOLIC PANEL WITHOUT GFR, Lipid panel  History of vitamin D  deficiency  Pain in both hands - Plan: Rheumatoid factor, Sedimentation Rate  Nonobstructive atherosclerosis of coronary artery  General medical exam Patient's blood pressure today is acceptable.  Recommended the patient check her blood pressure on a daily basis to ensure that her blood pressure stays less than 140/90.  Recommended checking a CBC, CMP, fasting lipid panel,.  Goal LDL  cholesterol is less than 70.  Given the pain in her hands, I will check a rheumatoid factor and a sedimentation rate.  However I suspect that the pain is likely due to the osteoarthritis.  Recommended the RSV vaccine.  Patient declines the shingles vaccine.  We discussed getting a flu shot and a COVID shot each fall.  Mammogram has already been scheduled.  Bone density test is not due for 3 years.  Colonoscopy is up-to-date.

## 2023-05-02 LAB — CBC WITH DIFFERENTIAL/PLATELET
Absolute Lymphocytes: 2842 {cells}/uL (ref 850–3900)
Absolute Monocytes: 493 {cells}/uL (ref 200–950)
Basophils Absolute: 70 {cells}/uL (ref 0–200)
Basophils Relative: 0.8 %
Eosinophils Absolute: 194 {cells}/uL (ref 15–500)
Eosinophils Relative: 2.2 %
HCT: 39.5 % (ref 35.0–45.0)
Hemoglobin: 13.4 g/dL (ref 11.7–15.5)
MCH: 30.7 pg (ref 27.0–33.0)
MCHC: 33.9 g/dL (ref 32.0–36.0)
MCV: 90.4 fL (ref 80.0–100.0)
MPV: 9.9 fL (ref 7.5–12.5)
Monocytes Relative: 5.6 %
Neutro Abs: 5201 {cells}/uL (ref 1500–7800)
Neutrophils Relative %: 59.1 %
Platelets: 325 10*3/uL (ref 140–400)
RBC: 4.37 10*6/uL (ref 3.80–5.10)
RDW: 12.6 % (ref 11.0–15.0)
Total Lymphocyte: 32.3 %
WBC: 8.8 10*3/uL (ref 3.8–10.8)

## 2023-05-02 LAB — COMPLETE METABOLIC PANEL WITHOUT GFR
AG Ratio: 1.6 (calc) (ref 1.0–2.5)
ALT: 19 U/L (ref 6–29)
AST: 15 U/L (ref 10–35)
Albumin: 4.5 g/dL (ref 3.6–5.1)
Alkaline phosphatase (APISO): 78 U/L (ref 37–153)
BUN: 16 mg/dL (ref 7–25)
CO2: 23 mmol/L (ref 20–32)
Calcium: 9.5 mg/dL (ref 8.6–10.4)
Chloride: 102 mmol/L (ref 98–110)
Creat: 0.92 mg/dL (ref 0.60–1.00)
Globulin: 2.9 g/dL (ref 1.9–3.7)
Glucose, Bld: 96 mg/dL (ref 65–99)
Potassium: 4.1 mmol/L (ref 3.5–5.3)
Sodium: 137 mmol/L (ref 135–146)
Total Bilirubin: 0.4 mg/dL (ref 0.2–1.2)
Total Protein: 7.4 g/dL (ref 6.1–8.1)

## 2023-05-02 LAB — LIPID PANEL
Cholesterol: 157 mg/dL (ref <200)
HDL: 67 mg/dL (ref >=50)
LDL Cholesterol (Calc): 66 mg/dL
Non-HDL Cholesterol (Calc): 90 mg/dL (ref <130)
Total CHOL/HDL Ratio: 2.3 (calc) (ref <5.0)
Triglycerides: 166 mg/dL — ABNORMAL HIGH (ref <150)

## 2023-05-02 LAB — RHEUMATOID FACTOR: Rheumatoid fact SerPl-aCnc: <10 [IU]/mL (ref <14)

## 2023-05-02 LAB — SEDIMENTATION RATE: Sed Rate: 9 mm/h (ref 0–30)

## 2023-05-11 ENCOUNTER — Other Ambulatory Visit: Payer: Self-pay | Admitting: Family Medicine

## 2023-05-15 ENCOUNTER — Telehealth: Payer: Self-pay | Admitting: Family Medicine

## 2023-05-15 NOTE — Telephone Encounter (Signed)
 Copied from CRM [id number]. Topic: Clinical - Medication Refill >> May 15, 2023 12:05 PM [name] wrote: Medication: metoprolol  succinate (TOPROL -XL) 25 MG 24 hr tablet AND losartan  (COZAAR ) 100 MG tablet   Has the patient contacted their pharmacy? Yes  This is the patient's preferred pharmacy:  [facility] # [address], [place] - [address] [place] [address] Phone: [phone] Fax: [phone]  Is this the correct pharmacy for this prescription? Yes  Has the prescription been filled recently? Yes  Is the patient out of the medication? Yes  Has the patient been seen for an appointment in the last year OR does the patient have an upcoming appointment? Yes  Can we respond through MyChart? Yes  Agent: Please be advised that Rx refills may take up to 3 business days. We ask that you follow-up with your pharmacy.

## 2023-05-17 ENCOUNTER — Other Ambulatory Visit: Payer: Self-pay | Admitting: Family Medicine

## 2023-05-17 MED ORDER — METOPROLOL SUCCINATE ER 25 MG PO TB24: 25.0000 mg | ORAL_TABLET | Freq: Every day | ORAL | 2 refills | Status: DC

## 2023-05-17 NOTE — Telephone Encounter (Signed)
 Requested Prescriptions  Pending Prescriptions Disp Refills   metoprolol  succinate (TOPROL -XL) 25 MG 24 hr tablet 30 tablet 2    Sig: Take 1 tablet (25 mg total) by mouth at bedtime.     Cardiovascular:  Beta Blockers Passed - 05/17/2023 10:44 AM      Passed - Last BP in normal range    BP Readings from Last 1 Encounters:  05/01/23 138/82         Passed - Last Heart Rate in normal range    Pulse Readings from Last 1 Encounters:  05/01/23 66         Passed - Valid encounter within last 6 months    Recent Outpatient Visits           2 weeks ago Benign essential HTN   [place] [facility] Cheril Cork, Cisco Crest, MD   1 month ago Leg swelling   [facility] Austine Lefort, MD   2 months ago Chest pain, unspecified type   [place] [facility] Medicine Austine Lefort, MD   9 months ago Watery eyes   [place] [facility] Austine Lefort, MD   1 year ago General medical exam   [place] [facility] Medicine Pickard, Cisco Crest, MD

## 2023-05-18 NOTE — Telephone Encounter (Signed)
 Requested medication (s) are due for refill today:  Yes  Requested medication (s) are on the active medication list: Yes  Last refill:  02/09/23  Future visit scheduled: No  Notes to clinic:  Last refilled by Dr. Justina Oman.    Requested Prescriptions  Pending Prescriptions Disp Refills   losartan  (COZAAR ) 100 MG tablet [Pharmacy Med Name: Losartan  Potassium Oral Tablet 100 MG] 30 tablet 0    Sig: Take 1 tablet (100 mg total) by mouth daily.     Cardiovascular:  Angiotensin Receptor Blockers Passed - 05/18/2023  3:05 PM      Passed - Cr in normal range and within 180 days    Creat  Date Value Ref Range Status  05/01/2023 0.92 0.60 - 1.00 mg/dL Final         Passed - K in normal range and within 180 days    Potassium  Date Value Ref Range Status  05/01/2023 4.1 3.5 - 5.3 mmol/L Final         Passed - Patient is not pregnant      Passed - Last BP in normal range    BP Readings from Last 1 Encounters:  05/01/23 138/82         Passed - Valid encounter within last 6 months    Recent Outpatient Visits           2 weeks ago Benign essential HTN   [facility] Pickard, Cisco Crest, MD   1 month ago Leg swelling   [place] [facility] Austine Lefort, MD   2 months ago Chest pain, unspecified type   [place] [facility] Austine Lefort, MD   9 months ago Watery eyes   [place] [facility] Medicine Cheril Cork, Cisco Crest, MD   1 year ago General medical exam   [place] [facility] Austine Lefort, MD               metoprolol  succinate (TOPROL -XL) 25 MG 24 hr tablet [Pharmacy Med Name: Metoprolol  Succinate ER Oral Tablet Extended Release 24 Hour 25 MG] 30 tablet 0    Sig: Take 1 tablet (25 mg total) by mouth at bedtime.     Cardiovascular:  Beta Blockers Passed - 05/18/2023  3:05 PM      Passed - Last BP in normal range    BP Readings from Last 1 Encounters:  05/01/23  138/82         Passed - Last Heart Rate in normal range    Pulse Readings from Last 1 Encounters:  05/01/23 66         Passed - Valid encounter within last 6 months    Recent Outpatient Visits           2 weeks ago Benign essential HTN   [place] [facility] Cheril Cork, Cisco Crest, MD   1 month ago Leg swelling   [facility] Austine Lefort, MD   2 months ago Chest pain, unspecified type   [place] [facility] Austine Lefort, MD   9 months ago Watery eyes   [facility] Medicine Austine Lefort, MD   1 year ago General medical exam   [place] [facility] Medicine Pickard, Cisco Crest, MD

## 2023-05-19 ENCOUNTER — Other Ambulatory Visit: Payer: Self-pay

## 2023-06-17 ENCOUNTER — Other Ambulatory Visit: Payer: Self-pay | Admitting: Family Medicine

## 2023-07-19 ENCOUNTER — Other Ambulatory Visit: Payer: Self-pay | Admitting: Family Medicine

## 2023-07-21 NOTE — Telephone Encounter (Signed)
 Requested Prescriptions  Pending Prescriptions Disp Refills   losartan  (COZAAR ) 100 MG tablet [Pharmacy Med Name: Losartan  Potassium Oral Tablet 100 MG] 30 tablet 0    Sig: TAKE ONE TABLET BY MOUTH ONCE A DAY     Cardiovascular:  Angiotensin Receptor Blockers Passed - 07/21/2023 10:30 AM      Passed - Cr in normal range and within 180 days    Creat  Date Value Ref Range Status  05/01/2023 0.92 0.60 - 1.00 mg/dL Final         Passed - K in normal range and within 180 days    Potassium  Date Value Ref Range Status  05/01/2023 4.1 3.5 - 5.3 mmol/L Final         Passed - Patient is not pregnant      Passed - Last BP in normal range    BP Readings from Last 1 Encounters:  05/01/23 138/82         Passed - Valid encounter within last 6 months    Recent Outpatient Visits           2 months ago Benign essential HTN   [facility] Duanne Butler DASEN, MD   3 months ago Leg swelling   [place] [facility] Duanne Butler DASEN, MD   5 months ago Chest pain, unspecified type   [place] [facility] Duanne Butler DASEN, MD   12 months ago Watery eyes   [facility] Duanne Butler DASEN, MD   1 year ago General medical exam   [place] [facility] Pickard, Butler DASEN, MD

## 2023-08-10 ENCOUNTER — Other Ambulatory Visit: Payer: Self-pay | Admitting: Family Medicine

## 2023-08-17 ENCOUNTER — Other Ambulatory Visit: Payer: Self-pay | Admitting: Family Medicine

## 2023-08-21 ENCOUNTER — Other Ambulatory Visit: Payer: Self-pay | Admitting: Family Medicine

## 2023-08-24 NOTE — Telephone Encounter (Signed)
 Requested Prescriptions  Pending Prescriptions Disp Refills   losartan  (COZAAR ) 100 MG tablet [Pharmacy Med Name: Losartan  Potassium Oral Tablet 100 MG] 90 tablet 0    Sig: TAKE ONE TABLET BY MOUTH ONCE A DAY     Cardiovascular:  Angiotensin Receptor Blockers Passed - 08/24/2023  7:51 AM      Passed - Cr in normal range and within 180 days    Creat  Date Value Ref Range Status  05/01/2023 0.92 0.60 - 1.00 mg/dL Final         Passed - K in normal range and within 180 days    Potassium  Date Value Ref Range Status  05/01/2023 4.1 3.5 - 5.3 mmol/L Final         Passed - Patient is not pregnant      Passed - Last BP in normal range    BP Readings from Last 1 Encounters:  05/01/23 138/82         Passed - Valid encounter within last 6 months    Recent Outpatient Visits           3 months ago Benign essential HTN   [place] [facility] Duanne Butler DASEN, MD   4 months ago Leg swelling   [place] [facility] Duanne Butler DASEN, MD   6 months ago Chest pain, unspecified type   [place] [facility] Duanne Butler DASEN, MD   1 year ago Watery eyes   [place] [facility] Duanne Butler DASEN, MD   1 year ago General medical exam   [place] [facility] Pickard, Butler DASEN, MD

## 2023-09-12 ENCOUNTER — Other Ambulatory Visit: Payer: Self-pay | Admitting: Family Medicine

## 2023-09-13 ENCOUNTER — Other Ambulatory Visit: Payer: Self-pay | Admitting: Family Medicine

## 2023-09-13 MED ORDER — METOPROLOL SUCCINATE ER 25 MG PO TB24: 25.0000 mg | ORAL_TABLET | Freq: Every day | ORAL | 0 refills | Status: DC

## 2023-09-13 NOTE — Telephone Encounter (Signed)
 FYI Only or Action Required?: Action required by provider: referral request.  Patient was last seen in primary care on 05/01/2023 by Duanne Butler DASEN, MD.  Called Nurse Triage reporting No chief complaint on file..  Symptoms began today.  Interventions attempted: Nothing.  Symptoms are: stable.  Triage Disposition: No disposition on file.  Patient/caregiver understands and will follow disposition?:

## 2023-09-13 NOTE — Telephone Encounter (Signed)
 Duplicate request, refilled 09/13/23 for 30 days.  Requested Prescriptions  Pending Prescriptions Disp Refills   metoprolol  succinate (TOPROL -XL) 25 MG 24 hr tablet [Pharmacy Med Name: Metoprolol  Succinate ER Oral Tablet Extended Release 24 Hour 25 MG] 30 tablet 0    Sig: TAKE ONE TABLET BY MOUTH DAILY AT BEDTIME     Cardiovascular:  Beta Blockers Passed - 09/13/2023  1:49 PM      Passed - Last BP in normal range    BP Readings from Last 1 Encounters:  05/01/23 138/82         Passed - Last Heart Rate in normal range    Pulse Readings from Last 1 Encounters:  05/01/23 66         Passed - Valid encounter within last 6 months    Recent Outpatient Visits           4 months ago Benign essential HTN   [facility] Duanne, Butler DASEN, MD   5 months ago Leg swelling   [facility] Duanne Butler DASEN, MD   6 months ago Chest pain, unspecified type   [place] [facility] Duanne Butler DASEN, MD   1 year ago Watery eyes   [place] [facility] Medicine Duanne Butler DASEN, MD   1 year ago General medical exam   [place] [facility] Pickard, Butler DASEN, MD

## 2023-09-13 NOTE — Telephone Encounter (Signed)
 Copied from CRM [id number]. Topic: Clinical - Medication Refill >> Sep 13, 2023  9:46 AM Carlatta H wrote: Medication: metoprolol  succinate (TOPROL -XL) 25 MG 24 hr tablet  Has the patient contacted their pharmacy? Yes (Agent: If no, request that the patient contact the pharmacy for the refill. If patient does not wish to contact the pharmacy document the reason why and proceed with request.) (Agent: If yes, when and what did the pharmacy advise?)  This is the patient's preferred pharmacy:  [facility] # [address] [place] - [address] [place]NA MULLIGAN [place] [address] Phone: [phone] Fax: [phone]    Is this the correct pharmacy for this prescription? Yes If no, delete pharmacy and type the correct one.   Has the prescription been filled recently? No  Is the patient out of the medication? Yes  Has the patient been seen for an appointment in the last year OR does the patient have an upcoming appointment? Yes  Can we respond through MyChart? No  Agent: Please be advised that Rx refills may take up to 3 business days. We ask that you follow-up with your pharmacy.

## 2023-09-14 ENCOUNTER — Other Ambulatory Visit: Payer: Self-pay | Admitting: Family Medicine

## 2023-09-29 ENCOUNTER — Other Ambulatory Visit: Payer: Self-pay | Admitting: Family Medicine

## 2023-09-29 NOTE — Telephone Encounter (Signed)
 Requested Prescriptions  Pending Prescriptions Disp Refills   rosuvastatin  (CRESTOR ) 10 MG tablet [Pharmacy Med Name: Rosuvastatin  Calcium  Oral Tablet 10 MG] 90 tablet 1    Sig: TAKE ONE TABLET BY MOUTH ONCE A DAY     Cardiovascular:  Antilipid - Statins 2 Failed - 09/29/2023  4:28 PM      Failed - Lipid Panel in normal range within the last 12 months    Cholesterol  Date Value Ref Range Status  05/01/2023 157 <200 mg/dL Final   LDL Cholesterol (Calc)  Date Value Ref Range Status  05/01/2023 66 mg/dL (calc) Final    Comment:    Reference range: <100 . Desirable range <100 mg/dL for primary prevention;   <70 mg/dL for patients with CHD or diabetic patients  with > or = 2 CHD risk factors. SABRA LDL-C is now calculated using the Martin-Hopkins  calculation, which is a validated novel method providing  better accuracy than the Friedewald equation in the  estimation of LDL-C.  Gladis APPLETHWAITE et al. SANDREA. 7986;689(80): 2061-2068  (http://education.[facility].com/faq/FAQ164)    HDL  Date Value Ref Range Status  05/01/2023 67 > OR = 50 mg/dL Final   Triglycerides  Date Value Ref Range Status  05/01/2023 166 (H) <150 mg/dL Final         Passed - Cr in normal range and within 360 days    Creat  Date Value Ref Range Status  05/01/2023 0.92 0.60 - 1.00 mg/dL Final         Passed - Patient is not pregnant      Passed - Valid encounter within last 12 months    Recent Outpatient Visits           5 months ago Benign essential HTN   [place] [facility] Pickard, Butler DASEN, MD   5 months ago Leg swelling   [place] [facility] Medicine Duanne Butler DASEN, MD   7 months ago Chest pain, unspecified type   [place] [facility] Medicine Duanne Butler DASEN, MD   1 year ago Watery eyes   [place] [facility] Medicine Duanne Butler DASEN, MD   1 year ago General medical exam   [place] [facility] Medicine Pickard, Butler DASEN,  MD

## 2023-10-10 ENCOUNTER — Other Ambulatory Visit: Payer: Self-pay | Admitting: Family Medicine

## 2023-11-09 ENCOUNTER — Telehealth: Payer: Self-pay | Admitting: Neurology

## 2023-11-09 NOTE — Telephone Encounter (Signed)
 Pt called wanting to restart the Xeomin  inj. She would like to know when she has to do for this. She said she stopped because she got scared of the dropping of her R eye when she last got it, but now she is just needing help. Please advise.

## 2023-11-10 ENCOUNTER — Other Ambulatory Visit: Payer: Self-pay | Admitting: Family Medicine

## 2023-11-11 NOTE — Telephone Encounter (Signed)
 Requested Prescriptions  Pending Prescriptions Disp Refills   metoprolol  succinate (TOPROL -XL) 25 MG 24 hr tablet [Pharmacy Med Name: Metoprolol  Succinate ER Oral Tablet Extended Release 24 Hour 25 MG] 90 tablet 0    Sig: Take 1 tablet (25 mg total) by mouth at bedtime     Cardiovascular:  Beta Blockers Failed - 11/11/2023  8:50 AM      Failed - Valid encounter within last 6 months    Recent Outpatient Visits           6 months ago Benign essential HTN   [place] [facility] Medicine Duanne, Butler DASEN, MD   7 months ago Leg swelling   [place] [facility] Duanne Butler DASEN, MD   8 months ago Chest pain, unspecified type   [place] [facility] Duanne Butler DASEN, MD   1 year ago Watery eyes   [place] [facility] Duanne Butler DASEN, MD   1 year ago General medical exam   [place] Duanne Butler DASEN, MD              Passed - Last BP in normal range    BP Readings from Last 1 Encounters:  05/01/23 138/82         Passed - Last Heart Rate in normal range    Pulse Readings from Last 1 Encounters:  05/01/23 66

## 2023-11-13 NOTE — Telephone Encounter (Addendum)
 Patient was last seen in Jan 2025, please let her know that due to her insurance restriction, our office could not continue injection,  If she wants to discuss other issues, I can see her.  Or she can ask her primary care refer to [facility] for injection.

## 2023-11-13 NOTE — Telephone Encounter (Signed)
 She has not been injected since 01/2023. It is not appropriate for me to schedule her for restart Xeomin  without MD's approval. Dr. Onita, please review.

## 2023-11-13 NOTE — Telephone Encounter (Signed)
 I called and spoke to pt and relayed msg pt voiced understanding to reach out to pcp and ask for referral to [facility].

## 2023-11-28 ENCOUNTER — Other Ambulatory Visit: Payer: Self-pay | Admitting: Family Medicine

## 2023-12-28 ENCOUNTER — Emergency Department (HOSPITAL_COMMUNITY)

## 2023-12-28 ENCOUNTER — Other Ambulatory Visit: Payer: Self-pay

## 2023-12-28 ENCOUNTER — Encounter (HOSPITAL_COMMUNITY): Payer: Self-pay

## 2023-12-28 ENCOUNTER — Emergency Department (HOSPITAL_COMMUNITY): Admission: EM | Admit: 2023-12-28 | Discharge: 2023-12-28 | Disposition: A | Discharge status: Home / Self Care

## 2023-12-28 DIAGNOSIS — M79671 Pain in right foot: Secondary | ICD-10-CM | POA: Diagnosis present

## 2023-12-28 DIAGNOSIS — S93401A Sprain of unspecified ligament of right ankle, initial encounter: Secondary | ICD-10-CM | POA: Insufficient documentation

## 2023-12-28 DIAGNOSIS — W19XXXA Unspecified fall, initial encounter: Secondary | ICD-10-CM | POA: Insufficient documentation

## 2023-12-28 DIAGNOSIS — Z23 Encounter for immunization: Secondary | ICD-10-CM | POA: Diagnosis not present

## 2023-12-28 DIAGNOSIS — R112 Nausea with vomiting, unspecified: Secondary | ICD-10-CM | POA: Insufficient documentation

## 2023-12-28 DIAGNOSIS — R197 Diarrhea, unspecified: Secondary | ICD-10-CM | POA: Insufficient documentation

## 2023-12-28 DIAGNOSIS — R55 Syncope and collapse: Secondary | ICD-10-CM | POA: Insufficient documentation

## 2023-12-28 LAB — COMPREHENSIVE METABOLIC PANEL WITH GFR
ALT: 24 U/L (ref 0–44)
AST: 22 U/L (ref 15–41)
Albumin: 4.6 g/dL (ref 3.5–5.0)
Alkaline Phosphatase: 95 U/L (ref 38–126)
Anion gap: 11 (ref 5–15)
BUN: 14 mg/dL (ref 8–23)
CO2: 25 mmol/L (ref 22–32)
Calcium: 9.2 mg/dL (ref 8.9–10.3)
Chloride: 102 mmol/L (ref 98–111)
Creatinine, Ser: 0.53 mg/dL (ref 0.44–1.00)
GFR, Estimated: >60 mL/min
Glucose, Bld: 106 mg/dL — ABNORMAL HIGH (ref 70–99)
Potassium: 4.2 mmol/L (ref 3.5–5.1)
Sodium: 138 mmol/L (ref 135–145)
Total Bilirubin: 0.4 mg/dL (ref 0.0–1.2)
Total Protein: 7.8 g/dL (ref 6.5–8.1)

## 2023-12-28 LAB — CBC
HCT: 43.1 % (ref 36.0–46.0)
Hemoglobin: 14.2 g/dL (ref 12.0–15.0)
MCH: 30.7 pg (ref 26.0–34.0)
MCHC: 32.9 g/dL (ref 30.0–36.0)
MCV: 93.3 fL (ref 80.0–100.0)
Platelets: 275 K/uL (ref 150–400)
RBC: 4.62 MIL/uL (ref 3.87–5.11)
RDW: 13.3 % (ref 11.5–15.5)
WBC: 8.8 K/uL (ref 4.0–10.5)
nRBC: 0 % (ref 0.0–0.2)

## 2023-12-28 LAB — TROPONIN T, HIGH SENSITIVITY
Troponin T High Sensitivity: 25 ng/L — ABNORMAL HIGH (ref 0–19)
Troponin T High Sensitivity: 24 ng/L — ABNORMAL HIGH (ref 0–19)

## 2023-12-28 MED ORDER — TETANUS-DIPHTH-ACELL PERTUSSIS 5-2-15.5 LF-MCG/0.5 IM SUSP
0.5000 mL | Freq: Once | INTRAMUSCULAR | Status: AC
  Administered 2023-12-28: 0.5 mL via INTRAMUSCULAR
  Filled 2023-12-28: qty 0.5

## 2023-12-28 MED ORDER — SODIUM CHLORIDE 0.9 % IV BOLUS
500.0000 mL | Freq: Once | INTRAVENOUS | Status: AC
  Administered 2023-12-28: 500 mL via INTRAVENOUS

## 2023-12-28 NOTE — Discharge Instructions (Signed)
 Your workup today was reassuring overall.  Please use the Aircast and crutches and follow-up with your doctor.  Take Tylenol  for pain.  Keep your leg elevated and use ice as needed for swelling.  Return to the ER for worsening symptoms.

## 2023-12-28 NOTE — ED Provider Notes (Signed)
 " [place] EMERGENCY DEPARTMENT AT [facility] Provider Note   CSN: [id number] Arrival date & time: 12/28/23  9165     Patient presents with: Fall and Foot Pain   Lynn Estes is a 78 y.o. female.   78 year old female with past medical history of vasovagal syncope in the past as well as hypertension presenting to the emergency department today after a syncopal episode and fall last night.  The patient states that she woke up and was having some mild epigastric discomfort.  She states that she then started having some abdominal cramping and went to the bathroom and want of having some loose stools.  She states that she also had some nausea at that time.  She did vomit once.  This was nonbloody and nonbilious.  She did have a bowel movement and states that when she got up after she went to the bathroom that she apparently had a syncopal episode and fell to the ground.  EMS came and evaluated the patient last night and she did not want to come to the hospital that point.  She woke up this morning was having pain in her right elbow and right ankle.  She came to the ER at that time for further evaluation.  She denies any chest or abdominal pain currently.   Fall  Foot Pain       Prior to Admission medications  Medication Sig Start Date End Date Taking? Authorizing Provider  calcium  carbonate (OS-CAL) 600 MG tablet Take 600 mg by mouth daily.    [provider]  Cholecalciferol (VITAMIN D  PO) Take 2,000 Units by mouth daily.    [provider]  clobetasol (TEMOVATE) 0.05 % external solution Apply 1 Application topically daily. 10/30/20   [provider]  clotrimazole  (LOTRIMIN ) 1 % cream Apply 1 Application topically daily as needed (for rash). 02/09/23   Ricky Fines, MD  Cyanocobalamin (VITAMIN B 12 PO) Take 1 tablet by mouth daily.    [provider]  fluticasone  (FLONASE ) 50 MCG/ACT nasal spray Place 2 sprays into both nostrils  daily. Patient taking differently: Place 2 sprays into both nostrils daily as needed (for congestion). 08/15/22   Tobie Arleta SQUIBB, MD  hydrochlorothiazide  (HYDRODIURIL ) 25 MG tablet Take 1 tablet (25 mg total) by mouth daily. Patient not taking: Reported on 05/01/2023 04/13/23   Duanne Butler DASEN, MD  losartan  (COZAAR ) 100 MG tablet TAKE ONE TABLET BY MOUTH ONCE A DAY 11/29/23   Duanne Butler DASEN, MD  metoprolol  succinate (TOPROL -XL) 25 MG 24 hr tablet Take 1 tablet (25 mg total) by mouth at bedtime 11/11/23   Duanne Butler DASEN, MD  Omega-3 Fatty Acids (FISH OIL) 1000 MG CAPS Take 1,000 mg by mouth daily.    [provider]  rosuvastatin  (CRESTOR ) 10 MG tablet TAKE ONE TABLET BY MOUTH ONCE A DAY 09/29/23   Duanne Butler DASEN, MD  TURMERIC PO Take 1 capsule by mouth daily.    [provider]  ZEPBOUND  2.5 MG/0.5ML injection vial INJECT 0.5 ML (2.5 MG) UNDER THE SKIN ONCE WEEKLY (0.5ML= 50 UNITS) 09/14/23   Duanne Butler DASEN, MD    Allergies: Patient has no known allergies.    Review of Systems  Musculoskeletal:  Positive for arthralgias.  Neurological:  Positive for syncope.  All other systems reviewed and are negative.   Updated Vital Signs BP (!) 159/75 (BP Location: Right Arm)   Pulse 74   Temp 98.3 F (36.8 C)   Resp 18  Ht 5' 5 (1.651 m)   Wt 86.2 kg   SpO2 100%   BMI 31.62 kg/m   Physical Exam Vitals and nursing note reviewed.   Gen: NAD Eyes: PERRL, EOMI HEENT: no oropharyngeal swelling Neck: trachea midline, no midline tenderness Resp: clear to auscultation bilaterally Card: RRR, no murmurs, rubs, or gallops Abd: nontender, nondistended Extremities: no calf tenderness, no edema, the patient has swelling and tenderness over the lateral malleolus on the left, also tender over the lateral aspect of the left elbow with normal range of motion of both joints, compartments are soft Vascular: 2+ radial pulses bilaterally, 2+ DP pulses bilaterally Neuro: No focal  deficits Skin: no rashes Psyc: acting appropriately   (all labs ordered are listed, but only abnormal results are displayed) Labs Reviewed  COMPREHENSIVE METABOLIC PANEL WITH GFR - Abnormal; Notable for the following components:      Result Value   Glucose, Bld 106 (*)    All other components within normal limits  TROPONIN T, HIGH SENSITIVITY - Abnormal; Notable for the following components:   Troponin T High Sensitivity 25 (*)    All other components within normal limits  TROPONIN T, HIGH SENSITIVITY - Abnormal; Notable for the following components:   Troponin T High Sensitivity 24 (*)    All other components within normal limits  CBC    EKG: None  Radiology: CT Head Wo Contrast Result Date: 12/28/2023 CLINICAL DATA:  Status post fall. EXAM: CT HEAD WITHOUT CONTRAST TECHNIQUE: Contiguous axial images were obtained from the base of the skull through the vertex without intravenous contrast. RADIATION DOSE REDUCTION: This exam was performed according to the departmental dose-optimization program which includes automated exposure control, adjustment of the mA and/or kV according to patient size and/or use of iterative reconstruction technique. COMPARISON:  November 24, 2006 FINDINGS: Brain: No evidence of acute infarction, hemorrhage, hydrocephalus, extra-axial collection or mass lesion/mass effect. Vascular: No hyperdense vessel or unexpected calcification. Skull: Normal. Negative for fracture or focal lesion. Sinuses/Orbits: No acute finding. Other: None. IMPRESSION: No acute intracranial pathology. Electronically Signed   By: Suzen Dials M.D.   On: 12/28/2023 10:44   DG Elbow Complete Right Result Date: 12/28/2023 CLINICAL DATA:  Fall.  Pain. EXAM: RIGHT ELBOW - COMPLETE 3+ VIEW COMPARISON:  None Available. FINDINGS: Study limited by positioning. Within this limitation, no evidence for an acute fracture or dislocation. No definite joint effusion. No worrisome lytic or sclerotic  osseous abnormality. IMPRESSION: Negative. Electronically Signed   By: Camellia Candle M.D.   On: 12/28/2023 10:38   DG Chest Port 1 View Result Date: 12/28/2023 CLINICAL DATA:  Fall. EXAM: PORTABLE CHEST 1 VIEW COMPARISON:  02/07/2023 FINDINGS: The cardio pericardial silhouette is enlarged. Interstitial markings are diffusely coarsened with chronic features. The lungs are clear without focal pneumonia, edema, pneumothorax or pleural effusion. No acute bony abnormality. Telemetry leads overlie the chest. IMPRESSION: Chronic interstitial coarsening without acute cardiopulmonary findings. Electronically Signed   By: Camellia Candle M.D.   On: 12/28/2023 10:31   DG Ankle Complete Right Result Date: 12/28/2023 CLINICAL DATA:  Fall.  Right foot pain. EXAM: RIGHT ANKLE - COMPLETE 3+ VIEW COMPARISON:  None Available. FINDINGS: There is no evidence of fracture, dislocation, or joint effusion. There is no evidence of arthropathy or other focal bone abnormality. Soft tissues are unremarkable. IMPRESSION: Negative. Electronically Signed   By: Camellia Candle M.D.   On: 12/28/2023 10:27   DG Foot Complete Right Result Date: 12/28/2023 CLINICAL DATA:  Pain  after a fall. EXAM: RIGHT FOOT COMPLETE - 3+ VIEW COMPARISON:  None Available. FINDINGS: Osteopenia. No acute osseous or joint abnormality. Mild first metatarsophalangeal joint osteoarthritis. Small degenerative cyst in the head of the fifth proximal phalanx. Lucency at the medial base of the first distal phalanx extends beyond the cortex, indicative of artifact. Prominent calcaneal spurs. Mild forefoot soft tissue swelling. IMPRESSION: Mild forefoot soft tissue swelling.  No acute fracture. Electronically Signed   By: Newell Eke M.D.   On: 12/28/2023 09:55     Procedures   Medications Ordered in the ED  sodium chloride  0.9 % bolus 500 mL (0 mLs Intravenous Stopped 12/28/23 1316)  Tdap (ADACEL ) injection 0.5 mL (0.5 mLs Intramuscular Given 12/28/23 1347)                                     Medical Decision Making 78 year old female presenting to the emergency department today after a syncopal episode last night.  This was around the time and the patient was having some nausea and vomiting and diarrhea.  The patient states she is feeling well this morning and denies any complaints other than the pain from the fall.  I will further evaluate her here with a cardiac workup.  Will obtain a head CT as well as an x-ray of her right elbow and right ankle for further evaluation for acute traumatic injuries.  Will give patient IV fluids.  Will obtain COVID and flu swabs to evaluate for viral etiology.  I will reevaluate for ultimate disposition.  The patient's work appears reassuring.  Trauma evaluation is negative.  The patient did have 2 troponins that were low and stable.  She remained stable while here.  Given her age and comorbidities we did discuss possible admission for further cardiac telemetry.  Since the patient does have a known history of vasovagal syncope and this does seem consistent with this she would like to go home and follow-up.  I do think this is reasonable.  Her tetanus is updated here.  I did place a referral to cardiology as an outpatient.  She is discharged with return precautions.  Amount and/or Complexity of Data Reviewed Labs: ordered. Radiology: ordered.  Risk Prescription drug management.        Final diagnoses:  Syncope, unspecified syncope type  Sprain of right ankle, unspecified ligament, initial encounter    ED Discharge Orders          Ordered    Ambulatory referral to Cardiology       Comments: If you have not heard from the Cardiology office within the next 72 hours please call [phone].   12/28/23 1500               Ula Prentice SAUNDERS, MD 12/28/23 1500  "

## 2023-12-28 NOTE — ED Triage Notes (Signed)
 Patient come in POV from home for complaint of fall last night that resulted from blacking out and ended up hitting her head on the door and hurting her right top of the foot and soreness to the right elbow. Denises taking blood thinners.

## 2024-01-18 ENCOUNTER — Other Ambulatory Visit: Payer: Self-pay | Admitting: Family Medicine

## 2024-02-23 ENCOUNTER — Ambulatory Visit: Admitting: Cardiology
